# Patient Record
Sex: Female | Born: 1970
Health system: Southern US, Community
[De-identification: ages and names within clinical notes are randomized; demographics above are authoritative.]

## PROBLEM LIST (undated history)

## (undated) DIAGNOSIS — IMO0001 Reserved for inherently not codable concepts without codable children: Secondary | ICD-10-CM

## (undated) DIAGNOSIS — Z5189 Encounter for other specified aftercare: Secondary | ICD-10-CM

## (undated) DIAGNOSIS — Z1501 Genetic susceptibility to malignant neoplasm of breast: Principal | ICD-10-CM

## (undated) DIAGNOSIS — Z1509 Genetic susceptibility to other malignant neoplasm: Principal | ICD-10-CM

## (undated) DIAGNOSIS — T82868A Thrombosis of vascular prosthetic devices, implants and grafts, initial encounter: Secondary | ICD-10-CM

## (undated) DIAGNOSIS — R112 Nausea with vomiting, unspecified: Secondary | ICD-10-CM

## (undated) DIAGNOSIS — J40 Bronchitis, not specified as acute or chronic: Secondary | ICD-10-CM

## (undated) DIAGNOSIS — K589 Irritable bowel syndrome without diarrhea: Secondary | ICD-10-CM

## (undated) DIAGNOSIS — R011 Cardiac murmur, unspecified: Secondary | ICD-10-CM

## (undated) DIAGNOSIS — R51 Headache: Secondary | ICD-10-CM

## (undated) DIAGNOSIS — Z9889 Other specified postprocedural states: Secondary | ICD-10-CM

## (undated) DIAGNOSIS — M1990 Unspecified osteoarthritis, unspecified site: Secondary | ICD-10-CM

## (undated) DIAGNOSIS — M8580 Other specified disorders of bone density and structure, unspecified site: Secondary | ICD-10-CM

## (undated) DIAGNOSIS — R39198 Other difficulties with micturition: Secondary | ICD-10-CM

## (undated) DIAGNOSIS — K824 Cholesterolosis of gallbladder: Secondary | ICD-10-CM

## (undated) DIAGNOSIS — K219 Gastro-esophageal reflux disease without esophagitis: Secondary | ICD-10-CM

## (undated) DIAGNOSIS — F32A Depression, unspecified: Secondary | ICD-10-CM

## (undated) DIAGNOSIS — F419 Anxiety disorder, unspecified: Secondary | ICD-10-CM

## (undated) DIAGNOSIS — D759 Disease of blood and blood-forming organs, unspecified: Secondary | ICD-10-CM

## (undated) DIAGNOSIS — T7840XA Allergy, unspecified, initial encounter: Secondary | ICD-10-CM

## (undated) DIAGNOSIS — J302 Other seasonal allergic rhinitis: Secondary | ICD-10-CM

## (undated) DIAGNOSIS — F329 Major depressive disorder, single episode, unspecified: Secondary | ICD-10-CM

## (undated) DIAGNOSIS — D689 Coagulation defect, unspecified: Secondary | ICD-10-CM

## (undated) HISTORY — DX: Major depressive disorder, single episode, unspecified: F32.9

## (undated) HISTORY — DX: Coagulation defect, unspecified: D68.9

## (undated) HISTORY — PX: ABDOMINAL HYSTERECTOMY: SHX81

## (undated) HISTORY — DX: Allergy, unspecified, initial encounter: T78.40XA

## (undated) HISTORY — DX: Genetic susceptibility to malignant neoplasm of breast: Z15.01

## (undated) HISTORY — DX: Other specified disorders of bone density and structure, unspecified site: M85.80

## (undated) HISTORY — DX: Genetic susceptibility to other malignant neoplasm: Z15.09

## (undated) HISTORY — DX: Thrombosis due to vascular prosthetic devices, implants and grafts, initial encounter: T82.868A

## (undated) HISTORY — DX: Depression, unspecified: F32.A

## (undated) HISTORY — DX: Encounter for other specified aftercare: Z51.89

## (undated) HISTORY — DX: Cholesterolosis of gallbladder: K82.4

---

## 2000-03-14 ENCOUNTER — Other Ambulatory Visit: Admission: RE | Admit: 2000-03-14 | Discharge: 2000-03-14 | Payer: Self-pay | Admitting: Family Medicine

## 2001-06-05 ENCOUNTER — Inpatient Hospital Stay (HOSPITAL_COMMUNITY): Admission: AD | Admit: 2001-06-05 | Discharge: 2001-06-07 | Payer: Self-pay | Admitting: Obstetrics and Gynecology

## 2001-07-17 ENCOUNTER — Other Ambulatory Visit: Admission: RE | Admit: 2001-07-17 | Discharge: 2001-07-17 | Payer: Self-pay | Admitting: Obstetrics and Gynecology

## 2002-09-03 ENCOUNTER — Other Ambulatory Visit: Admission: RE | Admit: 2002-09-03 | Discharge: 2002-09-03 | Payer: Self-pay | Admitting: Obstetrics and Gynecology

## 2003-09-25 ENCOUNTER — Other Ambulatory Visit: Admission: RE | Admit: 2003-09-25 | Discharge: 2003-09-25 | Payer: Self-pay | Admitting: Obstetrics and Gynecology

## 2004-10-14 ENCOUNTER — Other Ambulatory Visit: Admission: RE | Admit: 2004-10-14 | Discharge: 2004-10-14 | Payer: Self-pay | Admitting: Obstetrics and Gynecology

## 2005-01-16 HISTORY — PX: NASAL SEPTUM SURGERY: SHX37

## 2006-02-22 ENCOUNTER — Encounter: Admission: RE | Admit: 2006-02-22 | Discharge: 2006-02-22 | Payer: Self-pay | Admitting: Internal Medicine

## 2009-10-31 ENCOUNTER — Emergency Department (HOSPITAL_COMMUNITY): Admission: EM | Admit: 2009-10-31 | Discharge: 2009-10-31 | Payer: Self-pay | Admitting: Family Medicine

## 2009-11-18 ENCOUNTER — Encounter: Admission: RE | Admit: 2009-11-18 | Discharge: 2009-11-18 | Payer: Self-pay | Admitting: Internal Medicine

## 2010-03-30 LAB — AST: AST: 20 U/L (ref 0–37)

## 2010-03-30 LAB — ALT: ALT: 16 U/L (ref 0–35)

## 2010-03-30 LAB — GAMMA GT: GGT: 31 U/L (ref 7–51)

## 2010-06-20 ENCOUNTER — Encounter: Payer: Self-pay | Admitting: Genetic Counselor

## 2010-08-15 ENCOUNTER — Encounter: Payer: BC Managed Care – PPO | Admitting: Genetic Counselor

## 2010-09-09 ENCOUNTER — Encounter: Payer: BC Managed Care – PPO | Admitting: Oncology

## 2010-09-27 ENCOUNTER — Encounter (HOSPITAL_BASED_OUTPATIENT_CLINIC_OR_DEPARTMENT_OTHER): Payer: BC Managed Care – PPO | Admitting: Oncology

## 2010-09-27 ENCOUNTER — Other Ambulatory Visit: Payer: Self-pay | Admitting: Oncology

## 2010-09-27 DIAGNOSIS — Z7189 Other specified counseling: Secondary | ICD-10-CM

## 2010-09-27 DIAGNOSIS — Z8041 Family history of malignant neoplasm of ovary: Secondary | ICD-10-CM

## 2010-09-27 DIAGNOSIS — Z803 Family history of malignant neoplasm of breast: Secondary | ICD-10-CM

## 2010-09-30 ENCOUNTER — Other Ambulatory Visit: Payer: Self-pay | Admitting: Oncology

## 2010-09-30 ENCOUNTER — Encounter (HOSPITAL_BASED_OUTPATIENT_CLINIC_OR_DEPARTMENT_OTHER): Payer: BC Managed Care – PPO | Admitting: Oncology

## 2010-09-30 DIAGNOSIS — Z803 Family history of malignant neoplasm of breast: Secondary | ICD-10-CM

## 2010-09-30 LAB — CBC WITH DIFFERENTIAL/PLATELET
EOS%: 1.8 % (ref 0.0–7.0)
Eosinophils Absolute: 0.1 10*3/uL (ref 0.0–0.5)
LYMPH%: 40.1 % (ref 14.0–49.7)
MCH: 30.2 pg (ref 25.1–34.0)
MCHC: 35.3 g/dL (ref 31.5–36.0)
MCV: 85.6 fL (ref 79.5–101.0)
MONO%: 5.7 % (ref 0.0–14.0)
NEUT#: 2.4 10*3/uL (ref 1.5–6.5)
Platelets: 183 10*3/uL (ref 145–400)
RBC: 4.24 10*6/uL (ref 3.70–5.45)
RDW: 11.7 % (ref 11.2–14.5)

## 2010-09-30 LAB — COMPREHENSIVE METABOLIC PANEL
AST: 20 U/L (ref 0–37)
Alkaline Phosphatase: 51 U/L (ref 39–117)
Glucose, Bld: 87 mg/dL (ref 70–99)
Potassium: 4.3 mEq/L (ref 3.5–5.3)
Sodium: 142 mEq/L (ref 135–145)
Total Bilirubin: 2.6 mg/dL — ABNORMAL HIGH (ref 0.3–1.2)
Total Protein: 6.7 g/dL (ref 6.0–8.3)

## 2010-10-05 ENCOUNTER — Other Ambulatory Visit: Payer: Self-pay | Admitting: Obstetrics and Gynecology

## 2010-10-13 ENCOUNTER — Other Ambulatory Visit: Payer: Self-pay | Admitting: Oncology

## 2010-10-14 ENCOUNTER — Other Ambulatory Visit: Payer: Self-pay | Admitting: Oncology

## 2010-10-14 DIAGNOSIS — Z1501 Genetic susceptibility to malignant neoplasm of breast: Secondary | ICD-10-CM

## 2010-10-18 ENCOUNTER — Encounter: Payer: Self-pay | Admitting: *Deleted

## 2010-10-18 DIAGNOSIS — Z8481 Family history of carrier of genetic disease: Secondary | ICD-10-CM

## 2010-11-03 ENCOUNTER — Ambulatory Visit
Admission: RE | Admit: 2010-11-03 | Discharge: 2010-11-03 | Disposition: A | Discharge status: Home / Self Care | Payer: BC Managed Care – PPO | Source: Ambulatory Visit | Attending: Oncology | Admitting: Oncology

## 2010-11-03 DIAGNOSIS — Z1501 Genetic susceptibility to malignant neoplasm of breast: Secondary | ICD-10-CM

## 2010-11-09 ENCOUNTER — Other Ambulatory Visit: Payer: BC Managed Care – PPO

## 2010-11-11 NOTE — Patient Instructions (Addendum)
   Your procedure is scheduled WJ:[id number] November 6th  Enter through the Main Entrance of [facility] at:8:30am Pick up the phone at the desk and dial [phone] and inform us of your arrival.  Please call this number if you have any problems the morning of surgery: [phone]  Remember: Do not eat food after midnight:Monday Do not drink clear liquids after:midnight Monday Take these medicines the morning of surgery with a SIP OF WATER:none Do not wear jewelry, make-up, or FINGER nail polish Do not wear lotions, powders, or perfumes.  You may not wear deodorant. Do not shave 48 hours prior to surgery. Do not bring valuables to the hospital.  Leave suitcase in the car. After Surgery it may be brought to your room. For patients being admitted to the hospital, checkout time is 11:00am the day of discharge.    Remember to use your hibiclens as instructed.Please shower with 1/2 bottle the evening before your surgery and the other 1/2 bottle the morning of surgery.

## 2010-11-15 ENCOUNTER — Encounter (HOSPITAL_COMMUNITY): Payer: Self-pay

## 2010-11-15 ENCOUNTER — Encounter (HOSPITAL_COMMUNITY)
Admission: RE | Admit: 2010-11-15 | Discharge: 2010-11-15 | Disposition: A | Discharge status: Home / Self Care | Payer: BC Managed Care – PPO | Source: Ambulatory Visit | Attending: Obstetrics and Gynecology | Admitting: Obstetrics and Gynecology

## 2010-11-15 HISTORY — DX: Cardiac murmur, unspecified: R01.1

## 2010-11-15 HISTORY — DX: Encounter for other specified aftercare: Z51.89

## 2010-11-15 HISTORY — DX: Other specified postprocedural states: Z98.890

## 2010-11-15 HISTORY — DX: Gastro-esophageal reflux disease without esophagitis: K21.9

## 2010-11-15 HISTORY — DX: Headache: R51

## 2010-11-15 HISTORY — DX: Reserved for inherently not codable concepts without codable children: IMO0001

## 2010-11-15 HISTORY — DX: Other seasonal allergic rhinitis: J30.2

## 2010-11-15 HISTORY — DX: Other specified postprocedural states: R11.2

## 2010-11-15 LAB — CBC
Hemoglobin: 12.5 g/dL (ref 12.0–15.0)
MCH: 29.8 pg (ref 26.0–34.0)
MCHC: 33.9 g/dL (ref 30.0–36.0)
MCV: 88.1 fL (ref 78.0–100.0)
RBC: 4.19 MIL/uL (ref 3.87–5.11)

## 2010-11-21 MED ORDER — DEXTROSE 5 % IV SOLN
1.0000 g | INTRAVENOUS | Status: DC
  Filled 2010-11-21: qty 1

## 2010-11-21 NOTE — H&P (Signed)
NAME:  Dawn Boyd, DIRENZO NO.:  [id number]  MEDICAL RECORD NO.:  [id number]  LOCATION:  [facility]                           FACILITY:  [facility]  PHYSICIAN:  Randye Lobo, M.D.   DATE OF BIRTH:  1971/01/11  DATE OF ADMISSION:  11/15/2010 DATE OF DISCHARGE:  11/15/2010                             HISTORY & PHYSICAL   Preoperative history and physical examination is performed November 02, 2010.  Surgery is scheduled for the [facility] for November 22, 2010.  CHIEF COMPLAINT:  Positive BRCA2 mutation carrier.  HISTORY OF PRESENT ILLNESS:  The patient is a 40 year old gravida 2, para 2 Caucasian female who is known to be a carrier of the BRCA2 mutation.  The patient presented for routine gynecologic care in May of this year at which time her family history of her mother with the diagnosis of ovarian cancer diagnosed at age 71 was discussed with her. Recommendation was made to proceed with genetic counseling and testing. The patient was subsequently tested positive for BRCA2 mutation ([id number]).  The patient underwent extensive counseling with doctor Dr. Drue Second of the [facility].  After counseling with Dr. Welton Flakes and myself, the patient is now requesting to proceed with hysterectomy with removal of tubes and ovaries.  As part of the patient's preoperative evaluation, she underwent a pelvic ultrasound on September 21, 2010 which documented a normal uterus with an endometrial stripe of 8.14 mm.  The patient was noted to have a simple right ovarian cyst measuring 1.0 cm and a simple left ovarian cyst measuring 1.7 cm.  No abnormal blood flow was appreciated.  A CA-125 measured 7 on September 21, 2010.  The patient subsequently underwent a followup ultrasound on November 02, 2010 and this again documented a normal uterus with a left ovarian simple cyst now measuring 2.8 cm.  Past obstetric and gynecologic history is significant for 2 prior vaginal  deliveries.  The patient uses vasectomy for her form of birth control.  Her last Pap smear was performed Jun 02, 2010 and was within normal limits.  Her last mammogram was performed Jun 03, 2010 and was within normal limits.  The patient is scheduled for MRI of the breasts as part of her high risk screening protocol.  PAST MEDICAL HISTORY:  Migraine headaches.  The patient takes Maxalt as needed and she takes Tylenol almost daily.  Lactose intolerance.  MEDICATIONS:  Tylenol daily, Maxalt p.r.n., occasional multivitamin.  ALLERGIES:  No known drug allergies.  The patient is LACTOSE intolerant.  FAMILY HISTORY:  Positive for ovarian cancer in the patient's mother. The patient's mother is alive and well.  The patient has 2 paternal great cousins with breast cancer.  REVIEW OF SYSTEMS:  The patient recently had a viral illness with diarrhea which has resolved.  PHYSICAL EXAMINATION:  VITAL SIGNS:  Height is 5 feet, 3 inches, weight 114 pounds, blood pressure is 90/60. HEENT:  Normocephalic, atraumatic. NECK:  Negative for adenopathy and thyromegaly. LUNGS:  Clear to auscultation bilaterally. HEART:  S1, S2 with a regular rate and rhythm. ABDOMEN:  Soft and nontender and without evidence of hepatosplenomegaly or organomegaly. BREASTS:  No dominant  masses, skin retractions, nipple discharge, or axillary adenopathy. PELVIC:  Normal external genitalia and urethra.  The cervix and vagina demonstrate no lesions.  The uterus is small and nontender.  Exam of the adnexal regions demonstrated fullness in the right adnexa and a normal left adnexa.  These areas were nontender.  IMPRESSION:  The patient is a 40 year old para 2 female who is a carrier of the BRCA2 gene mutation ([id number]).  The patient has a simple left ovarian cyst on ultrasound with no abnormal blood flow and she has a normal CA-125.  PLAN:  The patient will undergo a laparoscopically assisted vaginal hysterectomy with  bilateral salpingo-oophorectomy and collection of pelvic washings at the [facility] [place] on November 22, 2010.  Risks, benefits, alternatives, and surgical goals have been reviewed with the patient who wishes to proceed.     Randye Lobo, M.D.     BES/MEDQ  D:  11/21/2010  T:  11/21/2010  Job:  [id number]

## 2010-11-22 ENCOUNTER — Ambulatory Visit (HOSPITAL_COMMUNITY)
Admission: RE | Admit: 2010-11-22 | Discharge: 2010-11-24 | Disposition: A | Discharge status: Home / Self Care | Payer: BC Managed Care – PPO | Source: Ambulatory Visit | Attending: Obstetrics and Gynecology | Admitting: Obstetrics and Gynecology

## 2010-11-22 ENCOUNTER — Encounter (HOSPITAL_COMMUNITY): Payer: Self-pay | Admitting: *Deleted

## 2010-11-22 ENCOUNTER — Encounter (HOSPITAL_COMMUNITY): Payer: Self-pay | Admitting: Anesthesiology

## 2010-11-22 ENCOUNTER — Other Ambulatory Visit: Payer: Self-pay | Admitting: Obstetrics and Gynecology

## 2010-11-22 ENCOUNTER — Ambulatory Visit (HOSPITAL_COMMUNITY): Payer: BC Managed Care – PPO | Admitting: Anesthesiology

## 2010-11-22 ENCOUNTER — Encounter (HOSPITAL_COMMUNITY)
Admission: RE | Disposition: A | Discharge status: Home / Self Care | Payer: Self-pay | Source: Ambulatory Visit | Attending: Obstetrics and Gynecology

## 2010-11-22 DIAGNOSIS — Z8041 Family history of malignant neoplasm of ovary: Secondary | ICD-10-CM | POA: Insufficient documentation

## 2010-11-22 DIAGNOSIS — Z4002 Encounter for prophylactic removal of ovary: Secondary | ICD-10-CM | POA: Insufficient documentation

## 2010-11-22 DIAGNOSIS — Z01818 Encounter for other preprocedural examination: Secondary | ICD-10-CM | POA: Insufficient documentation

## 2010-11-22 DIAGNOSIS — Z1501 Genetic susceptibility to malignant neoplasm of breast: Secondary | ICD-10-CM | POA: Insufficient documentation

## 2010-11-22 DIAGNOSIS — Z01812 Encounter for preprocedural laboratory examination: Secondary | ICD-10-CM | POA: Insufficient documentation

## 2010-11-22 HISTORY — PX: SALPINGOOPHORECTOMY: SHX82

## 2010-11-22 HISTORY — PX: LAPAROSCOPIC ASSISTED VAGINAL HYSTERECTOMY: SHX5398

## 2010-11-22 LAB — COMPREHENSIVE METABOLIC PANEL
ALT: 15 U/L (ref 0–35)
AST: 22 U/L (ref 0–37)
Albumin: 4.5 g/dL (ref 3.5–5.2)
Calcium: 9.8 mg/dL (ref 8.4–10.5)
GFR calc Af Amer: >90 mL/min (ref >90)
Sodium: 134 mEq/L — ABNORMAL LOW (ref 135–145)
Total Protein: 7 g/dL (ref 6.0–8.3)

## 2010-11-22 LAB — URINALYSIS, ROUTINE W REFLEX MICROSCOPIC
Ketones, ur: >80 mg/dL — AB
Leukocytes, UA: NEGATIVE
Nitrite: NEGATIVE
Specific Gravity, Urine: >1.03 — ABNORMAL HIGH (ref 1.005–1.030)
pH: 5 (ref 5.0–8.0)

## 2010-11-22 LAB — APTT: aPTT: 28 seconds (ref 24–37)

## 2010-11-22 LAB — PROTIME-INR: INR: 1.18 (ref 0.00–1.49)

## 2010-11-22 LAB — URINE MICROSCOPIC-ADD ON

## 2010-11-22 SURGERY — HYSTERECTOMY, VAGINAL, LAPAROSCOPY-ASSISTED
Anesthesia: General | Site: Abdomen | Wound class: Clean Contaminated

## 2010-11-22 MED ORDER — DOCUSATE SODIUM 100 MG PO CAPS
100.0000 mg | ORAL_CAPSULE | Freq: Every day | ORAL | Status: DC
  Administered 2010-11-22 – 2010-11-24 (×3): 100 mg via ORAL
  Filled 2010-11-22 (×3): qty 1

## 2010-11-22 MED ORDER — LIDOCAINE HCL (CARDIAC) 20 MG/ML IV SOLN
INTRAVENOUS | Status: DC | PRN
  Administered 2010-11-22: 50 mg via INTRAVENOUS

## 2010-11-22 MED ORDER — ONDANSETRON HCL 4 MG/2ML IJ SOLN
INTRAMUSCULAR | Status: AC
  Filled 2010-11-22: qty 4

## 2010-11-22 MED ORDER — BUPIVACAINE HCL (PF) 0.25 % IJ SOLN
INTRAMUSCULAR | Status: DC | PRN
  Administered 2010-11-22: 10 mL

## 2010-11-22 MED ORDER — MIDAZOLAM HCL 2 MG/2ML IJ SOLN
INTRAMUSCULAR | Status: AC
  Filled 2010-11-22: qty 2

## 2010-11-22 MED ORDER — HYDROMORPHONE HCL PF 1 MG/ML IJ SOLN
INTRAMUSCULAR | Status: DC | PRN
  Administered 2010-11-22: 1 mg via INTRAVENOUS

## 2010-11-22 MED ORDER — ROCURONIUM BROMIDE 50 MG/5ML IV SOLN
INTRAVENOUS | Status: AC
  Filled 2010-11-22: qty 1

## 2010-11-22 MED ORDER — LIDOCAINE-EPINEPHRINE 1 %-1:100000 IJ SOLN
INTRAMUSCULAR | Status: DC | PRN
  Administered 2010-11-22: 10 mL

## 2010-11-22 MED ORDER — DEXAMETHASONE SODIUM PHOSPHATE 10 MG/ML IJ SOLN
INTRAMUSCULAR | Status: AC
  Filled 2010-11-22: qty 1

## 2010-11-22 MED ORDER — DIPHENHYDRAMINE HCL 12.5 MG/5ML PO ELIX: 12.5000 mg | ORAL_SOLUTION | Freq: Four times a day (QID) | ORAL | Status: DC | PRN

## 2010-11-22 MED ORDER — OXYCODONE-ACETAMINOPHEN 5-325 MG PO TABS
1.0000 | ORAL_TABLET | ORAL | Status: DC | PRN
  Administered 2010-11-23 (×3): 1 via ORAL
  Administered 2010-11-23: 2 via ORAL
  Administered 2010-11-23 – 2010-11-24 (×3): 1 via ORAL
  Administered 2010-11-24: 2 via ORAL
  Filled 2010-11-22 (×2): qty 1
  Filled 2010-11-22: qty 2
  Filled 2010-11-22 (×3): qty 1
  Filled 2010-11-22: qty 2
  Filled 2010-11-22: qty 1

## 2010-11-22 MED ORDER — DEXTROSE 5 % IV SOLN
1.0000 g | INTRAVENOUS | Status: DC | PRN
  Administered 2010-11-22: 1 g via INTRAVENOUS

## 2010-11-22 MED ORDER — ONDANSETRON HCL 4 MG PO TABS: 4.0000 mg | ORAL_TABLET | Freq: Four times a day (QID) | ORAL | Status: DC | PRN

## 2010-11-22 MED ORDER — TEMAZEPAM 15 MG PO CAPS: 15.0000 mg | ORAL_CAPSULE | Freq: Every evening | ORAL | Status: DC | PRN

## 2010-11-22 MED ORDER — GLYCOPYRROLATE 0.2 MG/ML IJ SOLN
INTRAMUSCULAR | Status: DC | PRN
  Administered 2010-11-22: .6 mg via INTRAVENOUS

## 2010-11-22 MED ORDER — PROPOFOL 10 MG/ML IV EMUL
INTRAVENOUS | Status: DC | PRN
  Administered 2010-11-22: 130 mg via INTRAVENOUS

## 2010-11-22 MED ORDER — DEXAMETHASONE SODIUM PHOSPHATE 4 MG/ML IJ SOLN
INTRAMUSCULAR | Status: DC | PRN
  Administered 2010-11-22: 10 mg via INTRAVENOUS

## 2010-11-22 MED ORDER — METOCLOPRAMIDE HCL 5 MG/ML IJ SOLN
INTRAMUSCULAR | Status: AC
  Filled 2010-11-22: qty 2

## 2010-11-22 MED ORDER — MORPHINE SULFATE (PF) 1 MG/ML IV SOLN
INTRAVENOUS | Status: DC
  Administered 2010-11-22: 13.5 mg via INTRAVENOUS
  Administered 2010-11-22: 9 mL via INTRAVENOUS
  Administered 2010-11-22: 16:00:00 via INTRAVENOUS
  Administered 2010-11-23 (×2): 6 mg via INTRAVENOUS
  Filled 2010-11-22: qty 30

## 2010-11-22 MED ORDER — DIPHENHYDRAMINE HCL 50 MG/ML IJ SOLN: 12.5000 mg | Freq: Four times a day (QID) | INTRAMUSCULAR | Status: DC | PRN

## 2010-11-22 MED ORDER — GLYCOPYRROLATE 0.2 MG/ML IJ SOLN
INTRAMUSCULAR | Status: AC
  Filled 2010-11-22: qty 1

## 2010-11-22 MED ORDER — FENTANYL CITRATE 0.05 MG/ML IJ SOLN
INTRAMUSCULAR | Status: AC
  Filled 2010-11-22: qty 5

## 2010-11-22 MED ORDER — PROPOFOL 10 MG/ML IV EMUL
INTRAVENOUS | Status: AC
  Filled 2010-11-22: qty 20

## 2010-11-22 MED ORDER — SIMETHICONE 80 MG PO CHEW: 80.0000 mg | CHEWABLE_TABLET | Freq: Four times a day (QID) | ORAL | Status: DC | PRN

## 2010-11-22 MED ORDER — SCOPOLAMINE 1 MG/3DAYS TD PT72
MEDICATED_PATCH | TRANSDERMAL | Status: AC
  Administered 2010-11-22: 1.5 mg via TRANSDERMAL
  Filled 2010-11-22: qty 1

## 2010-11-22 MED ORDER — ONDANSETRON HCL 4 MG/2ML IJ SOLN: 4.0000 mg | Freq: Four times a day (QID) | INTRAMUSCULAR | Status: DC | PRN

## 2010-11-22 MED ORDER — ONDANSETRON HCL 4 MG/2ML IJ SOLN
4.0000 mg | Freq: Once | INTRAMUSCULAR | Status: AC
  Administered 2010-11-22: 4 mg via INTRAVENOUS

## 2010-11-22 MED ORDER — NEOSTIGMINE METHYLSULFATE 1 MG/ML IJ SOLN
INTRAMUSCULAR | Status: DC | PRN
  Administered 2010-11-22: 3 mg via INTRAVENOUS

## 2010-11-22 MED ORDER — SODIUM CHLORIDE 0.9 % IJ SOLN: 9.0000 mL | INTRAMUSCULAR | Status: DC | PRN

## 2010-11-22 MED ORDER — ROCURONIUM BROMIDE 100 MG/10ML IV SOLN
INTRAVENOUS | Status: DC | PRN
  Administered 2010-11-22: 5 mg via INTRAVENOUS
  Administered 2010-11-22: 40 mg via INTRAVENOUS
  Administered 2010-11-22: 10 mg via INTRAVENOUS

## 2010-11-22 MED ORDER — HYDROMORPHONE HCL PF 1 MG/ML IJ SOLN
INTRAMUSCULAR | Status: AC
  Filled 2010-11-22: qty 1

## 2010-11-22 MED ORDER — LACTATED RINGERS IV SOLN
INTRAVENOUS | Status: DC
  Administered 2010-11-22 – 2010-11-23 (×2): via INTRAVENOUS

## 2010-11-22 MED ORDER — NALOXONE HCL 0.4 MG/ML IJ SOLN: 0.4000 mg | INTRAMUSCULAR | Status: DC | PRN

## 2010-11-22 MED ORDER — SCOPOLAMINE 1 MG/3DAYS TD PT72
1.0000 | MEDICATED_PATCH | Freq: Once | TRANSDERMAL | Status: DC
  Administered 2010-11-22: 1.5 mg via TRANSDERMAL

## 2010-11-22 MED ORDER — ONDANSETRON HCL 4 MG/2ML IJ SOLN
INTRAMUSCULAR | Status: AC
  Administered 2010-11-22: 4 mg via INTRAVENOUS
  Filled 2010-11-22: qty 2

## 2010-11-22 MED ORDER — KETOROLAC TROMETHAMINE 30 MG/ML IJ SOLN
30.0000 mg | Freq: Four times a day (QID) | INTRAMUSCULAR | Status: AC
  Administered 2010-11-22 – 2010-11-23 (×3): 30 mg via INTRAVENOUS
  Filled 2010-11-22 (×3): qty 1

## 2010-11-22 MED ORDER — KETOROLAC TROMETHAMINE 30 MG/ML IJ SOLN
INTRAMUSCULAR | Status: DC | PRN
  Administered 2010-11-22: 30 mg via INTRAVENOUS

## 2010-11-22 MED ORDER — FENTANYL CITRATE 0.05 MG/ML IJ SOLN: 25.0000 ug | INTRAMUSCULAR | Status: DC | PRN

## 2010-11-22 MED ORDER — LACTATED RINGERS IR SOLN
Status: DC | PRN
  Administered 2010-11-22: 3000 mL

## 2010-11-22 MED ORDER — LIDOCAINE HCL (CARDIAC) 20 MG/ML IV SOLN
INTRAVENOUS | Status: AC
  Filled 2010-11-22: qty 5

## 2010-11-22 MED ORDER — MIDAZOLAM HCL 5 MG/5ML IJ SOLN
INTRAMUSCULAR | Status: DC | PRN
  Administered 2010-11-22: 2 mg via INTRAVENOUS

## 2010-11-22 MED ORDER — KETOROLAC TROMETHAMINE 30 MG/ML IJ SOLN
INTRAMUSCULAR | Status: AC
  Filled 2010-11-22: qty 1

## 2010-11-22 MED ORDER — NEOSTIGMINE METHYLSULFATE 1 MG/ML IJ SOLN
INTRAMUSCULAR | Status: AC
  Filled 2010-11-22: qty 10

## 2010-11-22 MED ORDER — MEPERIDINE HCL 25 MG/ML IJ SOLN: 6.2500 mg | INTRAMUSCULAR | Status: DC | PRN

## 2010-11-22 MED ORDER — MENTHOL 3 MG MT LOZG: 1.0000 | LOZENGE | OROMUCOSAL | Status: DC | PRN

## 2010-11-22 MED ORDER — METOCLOPRAMIDE HCL 5 MG/ML IJ SOLN
10.0000 mg | Freq: Once | INTRAMUSCULAR | Status: AC | PRN
  Administered 2010-11-22: 10 mg via INTRAVENOUS

## 2010-11-22 MED ORDER — IBUPROFEN 600 MG PO TABS
600.0000 mg | ORAL_TABLET | Freq: Four times a day (QID) | ORAL | Status: DC | PRN
  Administered 2010-11-23 (×2): 600 mg via ORAL
  Filled 2010-11-22 (×2): qty 1

## 2010-11-22 MED ORDER — LACTATED RINGERS IV SOLN
INTRAVENOUS | Status: DC
  Administered 2010-11-22 (×3): via INTRAVENOUS

## 2010-11-22 MED ORDER — FENTANYL CITRATE 0.05 MG/ML IJ SOLN
INTRAMUSCULAR | Status: DC | PRN
  Administered 2010-11-22: 100 ug via INTRAVENOUS
  Administered 2010-11-22: 150 ug via INTRAVENOUS

## 2010-11-22 SURGICAL SUPPLY — 36 items
ADH SKN CLS APL DERMABOND .7 (GAUZE/BANDAGES/DRESSINGS) ×2
CATH ROBINSON RED A/P 16FR (CATHETERS) IMPLANT
CLOTH BEACON ORANGE TIMEOUT ST (SAFETY) ×3 IMPLANT
CONT PATH 16OZ SNAP LID 3702 (MISCELLANEOUS) ×3 IMPLANT
COVER TABLE BACK 60X90 (DRAPES) ×3 IMPLANT
DECANTER SPIKE VIAL GLASS SM (MISCELLANEOUS) ×1 IMPLANT
DERMABOND ADVANCED (GAUZE/BANDAGES/DRESSINGS) ×1
DERMABOND ADVANCED .7 DNX12 (GAUZE/BANDAGES/DRESSINGS) IMPLANT
DRAPE UTILITY XL STRL (DRAPES) ×3 IMPLANT
ELECT REM PT RETURN 9FT ADLT (ELECTROSURGICAL) ×3
ELECTRODE REM PT RTRN 9FT ADLT (ELECTROSURGICAL) IMPLANT
FILTER SMOKE EVAC LAPAROSHD (FILTER) ×3 IMPLANT
FORCEPS CUTTING 33CM 5MM (CUTTING FORCEPS) ×1 IMPLANT
GLOVE BIO SURGEON STRL SZ 6.5 (GLOVE) ×12 IMPLANT
GOWN PREVENTION PLUS LG XLONG (DISPOSABLE) ×12 IMPLANT
NS IRRIG 1000ML POUR BTL (IV SOLUTION) ×3 IMPLANT
PACK LAVH (CUSTOM PROCEDURE TRAY) ×3 IMPLANT
SCISSORS LAP 5X35 DISP (ENDOMECHANICALS) ×1 IMPLANT
SET IRRIG TUBING LAPAROSCOPIC (IRRIGATION / IRRIGATOR) ×1 IMPLANT
SOLUTION ELECTROLUBE (MISCELLANEOUS) ×1 IMPLANT
STRIP CLOSURE SKIN 1/4X3 (GAUZE/BANDAGES/DRESSINGS) ×1 IMPLANT
SUT VIC AB 0 CT1 18XCR BRD8 (SUTURE) ×6 IMPLANT
SUT VIC AB 0 CT1 27 (SUTURE) ×6
SUT VIC AB 0 CT1 27XBRD ANBCTR (SUTURE) ×4 IMPLANT
SUT VIC AB 0 CT1 36 (SUTURE) ×3 IMPLANT
SUT VIC AB 0 CT1 8-18 (SUTURE) ×9
SUT VICRYL 0 TIES 12 18 (SUTURE) ×3 IMPLANT
SUT VICRYL 4-0 PS2 18IN ABS (SUTURE) ×3 IMPLANT
TIP UTERINE 5.1X6CM LAV DISP (MISCELLANEOUS) IMPLANT
TIP UTERINE 6.7X10CM GRN DISP (MISCELLANEOUS) IMPLANT
TIP UTERINE 6.7X6CM WHT DISP (MISCELLANEOUS) IMPLANT
TIP UTERINE 6.7X8CM BLUE DISP (MISCELLANEOUS) IMPLANT
TOWEL OR 17X24 6PK STRL BLUE (TOWEL DISPOSABLE) ×6 IMPLANT
TRAY FOLEY CATH 16FR SILVER (SET/KITS/TRAYS/PACK) ×3 IMPLANT
WARMER LAPAROSCOPE (MISCELLANEOUS) ×3 IMPLANT
WATER STERILE IRR 1000ML POUR (IV SOLUTION) ×3 IMPLANT

## 2010-11-22 NOTE — Anesthesia Postprocedure Evaluation (Signed)
  Anesthesia Post-op Note  Patient: Dawn Boyd  Procedure(s) Performed:  LAPAROSCOPIC ASSISTED VAGINAL HYSTERECTOMY; SALPINGO OOPHERECTOMY  Patient Location: PACU  Anesthesia Type: General  Level of Consciousness: awake, alert  and oriented  Airway and Oxygen Therapy: Patient Spontanous Breathing  Post-op Pain: mild  Post-op Assessment: Post-op Vital signs reviewed, Patient's Cardiovascular Status Stable, Respiratory Function Stable, Patent Airway, No signs of Nausea or vomiting and Pain level controlled  Post-op Vital Signs: Reviewed and stable  Complications: No apparent anesthesia complications

## 2010-11-22 NOTE — Transfer of Care (Signed)
Immediate Anesthesia Transfer of Care Note  Patient: Dawn Boyd  Procedure(s) Performed:  LAPAROSCOPIC ASSISTED VAGINAL HYSTERECTOMY; SALPINGO OOPHERECTOMY  Patient Location: PACU  Anesthesia Type: General  Level of Consciousness: awake and oriented  Airway & Oxygen Therapy: Patient Spontanous Breathing and Patient connected to nasal cannula oxygen  Post-op Assessment: Report given to PACU RN and Post -op Vital signs reviewed and stable  Post vital signs: Reviewed and stable  Complications: No apparent anesthesia complications

## 2010-11-22 NOTE — Op Note (Signed)
NAME:  Dawn Boyd, VENCILL NO.:  [id number]  MEDICAL RECORD NO.:  [id number]  LOCATION:  [facility]                          FACILITY:  [facility]  PHYSICIAN:  Randye Lobo, M.D.   DATE OF BIRTH:  18-Dec-1970  DATE OF PROCEDURE:  11/22/2010 DATE OF DISCHARGE:                              OPERATIVE REPORT   PREOPERATIVE DIAGNOSIS:  BRCA2 mutation carrier.  POSTOPERATIVE DIAGNOSIS:  BRCA2 mutation carrier.  PROCEDURE:  Laparoscopically-assisted vaginal hysterectomy with bilateral salpingo-oophorectomy, collection of pelvic washings.  SURGEON:  Randye Lobo, MD  ASSISTANT:  Luvenia Redden, MD  ANESTHESIA:  General endotracheal, local with 1% lidocaine with epinephrine 1:100,000, Marcaine 0.25%.  IV FLUIDS:  1700 mL Ringer's lactate.  ESTIMATED BLOOD LOSS:  100 mL.  URINE OUTPUT:  500 mL.  COMPLICATIONS:  None.  INDICATIONS FOR PROCEDURE:  The patient is a 40 year old, gravida 2, para 2, Caucasian female, who presents with a family history of a mother who developed ovarian cancer in her 5s.  The patient was sent for genetic counseling and testing and was found to have the BRCA2 mutation. The patient underwent extensive counseling regarding options for care and has chosen to proceed with a hysterectomy with removal of tubes and ovaries.  The patient did have a preoperative pelvic ultrasound which documented a simple left ovarian cyst measuring 2.8 cm.  There was no abnormal blood flow to the ovarian cyst and her CA-125 was 7.  A plan is now made to proceed with a laparoscopically-assisted vaginal hysterectomy with bilateral salpingo-oophorectomy after risks, benefits, alternatives, and surgical goals have been discussed.  Laparoscopy demonstrated a 1.5 cm simple-appearing left ovarian cyst, which did rupture during the procedure.  Clear fluid was noted.  The ovaries, fallopian tubes, and uterus were unremarkable.  There was no evidence of any excrescences or  papillations throughout the abdomen or pelvis.  There was no evidence of any adhesive disease.  In the upper abdomen, the liver and gallbladder appeared to be normal. The tip of the appendix appeared to be unremarkable.  There was no ascites in the abdomen.  There was a small amount of brownish stained peritoneal fluid in the cul-de-sac in the beginning of the procedure.  SPECIMENS:  The uterus with cervix, bilateral tubes, and ovaries were sent to pathology separately from pelvic washings.  PROCEDURE IN DETAIL:  The patient was reidentified in the preoperative hold area.  She received cefotetan 1 g IV for antibiotic prophylaxis. She received both TED hose and PAS stockings for DVT prophylaxis.  In the operating room, general endotracheal anesthesia was induced and the patient was then placed in the dorsal lithotomy position.  The lower abdomen, vagina, and perineum were sterilely prepped and draped.  A Foley catheter was placed inside the bladder.  A speculum was placed inside the vagina and a single-tooth tenaculum was placed on the anterior cervical lip.  This was replaced with a Hulka tenaculum and the remaining vaginal instruments were removed.  Attention was turned to the abdomen where a 1-cm vertical umbilical incision was created sharply with a scalpel.  Dissection down to the fascia was performed with an Allis clamp.  An attempt was  made to place the 10-mm trocar directly inside the peritoneal cavity, but this was not possible initially.  The Veress needle was then placed intraperitoneally late and when the pressure was checked, it was noted to be high and the Veress needle was therefore removed.  There was one final attempt at placing the trocar directly and this was successful.  The laparoscope confirmed proper placement.  A CO2 pneumoperitoneum was achieved.  The patient was placed in the Trendelenburg position.  A 5-mm right and left lower quadrant incisions were  created and 5-mm trocars were placed under direct visualization of the laparoscope.  Pelvic washings were performed and sent to pathology.  An inspection of the pelvic and abdominal organs was performed.  The procedure began by taking down some small congenital adhesions between the sigmoid colon and the left infundibulopelvic ligament.  This was performed with a combination of sharp and blunt dissection.  The region of the ureter was identified.  The infundibulopelvic ligament was then grasped with the gyrus instrument.  It was triply cauterized and then bisected using the same.  Dissection continued through the left broad ligament using the same instrument.  The left round ligament was then cauterized and bisected.  Dissection was continued through the anterior leaf of the broad ligament on the patient's left-hand side using the gyrus.  The bladder flap was partially taken down on the left- hand side.  The same procedure that was performed on the patient's left- hand side was then repeated on the right-hand side again using the gyrus instrument.  The bladder was further taken down in the midline.  Each of the uterine arteries were cauterized at this time and were bisected with the gyrus instrument.  Hemostasis was good at this time and the remainder of the hysterectomy was performed vaginally.  The CO2 pneumoperitoneum was released.  The patient was placed in the high lithotomy position.  A weighted speculum was placed inside the vagina.  Tenaculums were placed on the anterior and posterior cervical lips.  The cervix was injected circumferentially with lidocaine 1% with epinephrine 1:100,000.  The cervix was circumscribed with a scalpel.  The posterior cul-de-sac was entered sharply with the Mayo scissors.  A long weighted speculum was placed inside the vagina and into the cul-de-sac.  Each of the uterosacral ligaments were then clamped, sharply divided, and suture ligated with  0-Vicryl.  The bladder was dissected off the cervix in the midline and each of the bladder pillars were then clamped, sharply divided, and suture ligated with 0-Vicryl bilaterally.  The inferior aspects of the cardinal ligaments were then clamped, sharply divided, and suture ligated with 0-Vicryl bilaterally.  This allowed the specimen to be freed which was sent to pathology.  There was some bleeding near the patient's left uterosacral ligament at this time and a figure-of-eight suture of 0-Vicryl was placed after monopolar cautery was not successful to control hemostasis.  This controlled the bleeding well after the suture was placed.  The posterior vaginal cuff was whip stitched with a running lock suture of 0-Vicryl.  A McCall culdoplasty suture was performed at this time.  The suture was brought through the vagina and into the cul-de-sac at the 6 o'clock position.  It was brought down through the distal left uterosacral ligament, across the posterior cul-de-sac, down through the distal right uterosacral ligament and then into the cul-de-sac and out the vagina at the 6 o'clock position.  The vaginal cuff was then sutured with a running lock suture  of 0- Vicryl.  The McCall culdoplasty suture was tied and there was excellent elevation and support of the vaginal cuff.  Final laparoscopy was performed at this time.  The CO2 pneumoperitoneum was reachieved.  The pelvis was irrigated and suctioned.  There was 1 small vessel that was cauterized along the superior portion of the right infundibulopelvic ligament.  All operative sites were hemostatic at this time and the procedure was therefore concluded.  The 5-mm ports were removed under visualization of the laparoscope.  The umbilical trocar and the laparoscope were removed simultaneously after the CO2 pneumoperitoneum was released.  The umbilical incision was closed with a through-and-through suture of 0- Vicryl along the fascia.   All skin incisions were closed with subcuticular sutures of 3-0 Vicryl suture.  Dermabond was placed over the incisions.  The patient was awakened and extubated, and escorted to the recovery room in stable condition.  There were no complications to the procedure. All needle, instrument, and sponge counts were correct.     Randye Lobo, M.D.     BES/MEDQ  D:  11/22/2010  T:  11/22/2010  Job:  [id number]

## 2010-11-22 NOTE — Progress Notes (Signed)
Pt states "feeling nausea". Dr foster aware and order given.

## 2010-11-22 NOTE — Progress Notes (Signed)
Post Op Check in PACU  S - Patient comfortable. O - AVSS.  Abdomen - incisions are clean, dry, and intact.  Dermabond is present.  Vaginal pad - essentially dry.  Ext - PAS and Ted hose are on. A/P - 1. S/P LAVH/BSO, collection of pelvic washings.  Stable post op.  - Foley overnight.  - Morphine PCA.  - Toradol.  - CBC, BMP in am.  2. Hyperbilirubinemia - chronic.  - Check LFTs in am.

## 2010-11-22 NOTE — Progress Notes (Signed)
Pt states feels better.

## 2010-11-22 NOTE — Progress Notes (Addendum)
Pre-op Visit  No marked change in status since office pre-op visit.  Patient has been examined.    Patient has a chronically high bilirubin level.  She has been evaluated for this.  OK to proceed with surgery.

## 2010-11-22 NOTE — Anesthesia Preprocedure Evaluation (Addendum)
Anesthesia Evaluation  Patient identified by MRN, date of birth, ID band Patient awake    Reviewed: Allergy & Precautions, H&P , NPO status , Patient's Chart, lab work & pertinent test results  History of Anesthesia Complications (+) PONV and Family history of anesthesia reaction  Airway Mallampati: II TM Distance: >3 FB Neck ROM: Full    Dental No notable dental hx. (+) Teeth Intact   Pulmonary neg pulmonary ROS,  clear to auscultation  Pulmonary exam normal       Cardiovascular + Valvular Problems/Murmurs Regular Normal    Neuro/Psych  Headaches, Negative Psych ROS   GI/Hepatic negative GI ROS, Neg liver ROS, GERD-  Controlled,  Endo/Other  Negative Endocrine ROS  Renal/GU negative Renal ROS  Genitourinary negative   Musculoskeletal negative musculoskeletal ROS (+)   Abdominal   Peds  Hematology negative hematology ROS (+)   Anesthesia Other Findings   Reproductive/Obstetrics negative OB ROS                           Anesthesia Physical Anesthesia Plan  ASA: II  Anesthesia Plan: General   Post-op Pain Management:    Induction: Intravenous  Airway Management Planned: Oral ETT  Additional Equipment:   Intra-op Plan:   Post-operative Plan:   Informed Consent:   Dental advisory given  Plan Discussed with: CRNA, Anesthesiologist and Surgeon  Anesthesia Plan Comments:         Anesthesia Quick Evaluation

## 2010-11-22 NOTE — Brief Op Note (Signed)
11/22/2010  1:12 PM  PATIENT:  Dawn Boyd  40 y.o. female  PRE-OPERATIVE DIAGNOSIS:  BRCA2 mutation carrier  POST-OPERATIVE DIAGNOSIS:  BRCA2 mutation carrier  PROCEDURE:  Procedure(s): LAPAROSCOPIC ASSISTED VAGINAL HYSTERECTOMY SALPINGO OOPHERECTOMY, COLLECTION OF PELVIC WASHINGS  SURGEON:  Surgeon(s): Brook A Silva W Scott Bowie  PHYSICIAN ASSISTANT:   ASSISTANTS: Luvenia Redden   ANESTHESIA:   general, local  EBL:  Total I/O In: -  Out: 600 [Urine:500; Blood:100]  BLOOD ADMINISTERED:none  DRAINS: Urinary Catheter (Foley)   LOCAL MEDICATIONS USED:  LIDOCAINE 1% WITH EPI 100,000 CC, MARCAINE .25%  SPECIMEN:  Source of Specimen:   Uterus, cervix, tubes, and ovaries separately from pelvic washings.  DISPOSITION OF SPECIMEN:  PATHOLOGY  COUNTS:  YES  TOURNIQUET:  * No tourniquets in log *  DICTATION: .Other Dictation: Dictation Number    PLAN OF CARE: Admit for overnight observation  PATIENT DISPOSITION:  PACU - hemodynamically stable.   Delay start of Pharmacological VTE agent (>24hrs) due to surgical blood loss or risk of bleeding:  no

## 2010-11-23 ENCOUNTER — Encounter (HOSPITAL_COMMUNITY): Payer: Self-pay | Admitting: Obstetrics and Gynecology

## 2010-11-23 LAB — CBC
MCH: 30.9 pg (ref 26.0–34.0)
MCHC: 35.8 g/dL (ref 30.0–36.0)
MCV: 86.4 fL (ref 78.0–100.0)
Platelets: 138 10*3/uL — ABNORMAL LOW (ref 150–400)
RDW: 11.8 % (ref 11.5–15.5)

## 2010-11-23 LAB — BASIC METABOLIC PANEL
Calcium: 8.6 mg/dL (ref 8.4–10.5)
Creatinine, Ser: 0.71 mg/dL (ref 0.50–1.10)
GFR calc non Af Amer: >90 mL/min (ref >90)
Sodium: 137 mEq/L (ref 135–145)

## 2010-11-23 LAB — HEPATIC FUNCTION PANEL
Albumin: 2.9 g/dL — ABNORMAL LOW (ref 3.5–5.2)
Total Bilirubin: 1.9 mg/dL — ABNORMAL HIGH (ref 0.3–1.2)
Total Protein: 5 g/dL — ABNORMAL LOW (ref 6.0–8.3)

## 2010-11-23 MED ORDER — IBUPROFEN 600 MG PO TABS: 600.0000 mg | ORAL_TABLET | Freq: Four times a day (QID) | ORAL | Status: AC | PRN

## 2010-11-23 MED ORDER — OXYCODONE-ACETAMINOPHEN 5-325 MG PO TABS: 1.0000 | ORAL_TABLET | ORAL | Status: AC | PRN

## 2010-11-23 NOTE — Progress Notes (Signed)
POD #1  S - Foley out.  Ambulated once.  Good pain control with PCA. O - AVSS.  UO 2050 cc.  Lungs - CTA bilaterally.  Cor - S1 S2 RRR.  Abdomen - Active bowel sounds, soft, nontender.  Incisions clean, dry, intact.  Vag Pad - Essentially dry.  Labs - Hbg - 10.7, T bili - 1.9 A/P -  Doing well post op.  - Advance diet.  - D/C PCA and start po pain meds.  - Ambulate.  - D/C home after tolerates above.  - Rx:  Percocet, Motrin.  - Instructions/precautions given.    - Surgery reviewed with patient.  - Follow up in 4 weeks.

## 2010-11-24 NOTE — Progress Notes (Signed)
POD 2  S - Patient stayed last night in hospital.  Unable to void.  Had foley replaced.  Passed flatus this am. O - AVSS.    Abdomen - soft, nontender, nondistended.  Incisions clean, dry, intact.  Vaginal pad - essentially dry. A/P - S/P LAVH/BSO/collection of pelvic washings.  Urinary retention.  - Voiding trial this am.  If unable to void, will discharge with foley.  - Instructions and Rx already given to patient.  - Follow up in 4 weeks if discharged without foley.  Otherwise, follow up in office in 4 days.

## 2010-11-24 NOTE — Discharge Summary (Signed)
Admit Date -  11/22/10 Discharge Date - 11/24/10 Admit Dx -  Carrier of BRCA2 mutation Discharge Dx - Carrier of BRCA2 mutation, post op urinary retension Procedure -  Laparoscopically assisted vaginal hysterectomy, bilateral salpingo-oophorectomy, collection of pelvic washings History and Physical - 40 year old G2P2 female with recent diagnosis of carrier status of BRCA2 mutation and positive family history of mother, a survivor of ovarian cancer, who requests hysterectomy with removal of tubes and ovaries after genetic, oncologic, and gynecologic counseling.  Patient had a normal breast and pelvic exam.  Preop ultrasound showed a small simple left ovarian cyst and serum studies demonstrated a normal CA125. Hospital Course - Patient had an uncomplicated LAVH/BSO/collection of pelvic washings.  Her post op course was significant for urinary retention post op requiring foley replacement and an additional day of hospitalization.  Hgb 10.7.  Final pathology report pending at discharge. Discharge status - 1. Discharge to home. 2. Replace foley if unable to void. 3. Decreased activity for 6 weeks. 4. Percocet and ibuprophen rxs.  Do not resume tylenol, but can otherwise resume usual meds. 5. Call for fever, nausea and vomiting, incisional drainage, heavy vaginal bleeding, increasing pain, or inability to void. 6. Follow up in 4 weeks for routine post op visit.  Follow up in 4 days if discharged with foley.

## 2010-11-25 ENCOUNTER — Ambulatory Visit: Payer: BC Managed Care – PPO | Admitting: Oncology

## 2011-07-06 ENCOUNTER — Telehealth: Payer: Self-pay | Admitting: *Deleted

## 2011-07-06 ENCOUNTER — Other Ambulatory Visit: Payer: Self-pay | Admitting: Obstetrics and Gynecology

## 2011-07-06 NOTE — Telephone Encounter (Signed)
Call from [place] at Dr. Rica Records office. Pt had screening/imaging done with Dr. Rica Records office today. Dr. Edward Jolly requesting f/u with Dr. Welton Flakes. Pt FTKA 11/25/10  Will review with MD.

## 2011-07-19 ENCOUNTER — Telehealth: Payer: Self-pay | Admitting: Oncology

## 2011-07-19 ENCOUNTER — Other Ambulatory Visit: Payer: Self-pay | Admitting: Medical Oncology

## 2011-07-19 NOTE — Telephone Encounter (Signed)
lmonvm adviisng the pt of her aug 2013 appt

## 2011-09-08 ENCOUNTER — Other Ambulatory Visit: Payer: BC Managed Care – PPO | Admitting: Lab

## 2011-09-08 ENCOUNTER — Telehealth: Payer: Self-pay | Admitting: *Deleted

## 2011-09-08 ENCOUNTER — Ambulatory Visit (HOSPITAL_BASED_OUTPATIENT_CLINIC_OR_DEPARTMENT_OTHER): Payer: BC Managed Care – PPO | Admitting: Oncology

## 2011-09-08 ENCOUNTER — Encounter: Payer: Self-pay | Admitting: Oncology

## 2011-09-08 VITALS — BP 119/81 | HR 83 | Temp 97.8°F | Resp 20 | Ht 64.0 in | Wt 119.9 lb

## 2011-09-08 DIAGNOSIS — Z1509 Genetic susceptibility to other malignant neoplasm: Secondary | ICD-10-CM

## 2011-09-08 DIAGNOSIS — Z9079 Acquired absence of other genital organ(s): Secondary | ICD-10-CM

## 2011-09-08 DIAGNOSIS — Z1501 Genetic susceptibility to malignant neoplasm of breast: Secondary | ICD-10-CM

## 2011-09-08 HISTORY — DX: Genetic susceptibility to malignant neoplasm of breast: Z15.09

## 2011-09-08 HISTORY — DX: Genetic susceptibility to malignant neoplasm of breast: Z15.01

## 2011-09-08 MED ORDER — TAMOXIFEN CITRATE 20 MG PO TABS: 20.0000 mg | ORAL_TABLET | Freq: Every day | ORAL | Status: AC

## 2011-09-08 NOTE — Telephone Encounter (Signed)
Gave patient appointment for mri of the breast 10-11-2011 and 11-17-2011 starting at 11:30am lab and md

## 2011-09-08 NOTE — Progress Notes (Signed)
OFFICE PROGRESS NOTE  CC Dr. Creola Corn Dr. Conley Simmonds  DIAGNOSIS: 41 year-old female positive for BRCA2 mutation  PRIOR THERAPY:.  #1 patient was originally seen by me on 09/27/2010 for discussion of risk reducing strategies. At that time she was uncertain whether she wanted to undergo bilateral mastectomies or abdominal hysterectomy and bilateral salpingo-oophorectomy. However since that time patient has opted to have bilateral salpingo-oophorectomy performed. She however still has preservation of her breasts. Patient therefore is seen today for discussion of surveillance and prevention of breast cancer in a BRCA2 carrier.  CURRENT THERAPY:Tamoxifen 20 mg daily as chemopreventive for Breast cancer in a BRCA2 mutation carrier.  INTERVAL HISTORY: Dawn Boyd 41 y.o. female returns for Followup visit today. As stated above her last visit and first visit with me was on 09/27/2010. At that time we had an extensive discussion including her risk of developing breast cancer versus risk of developing ovarian cancer. Since then she has had bilateral salpingo-oophorectomies performed. This certainly will help her reduce her risk of developing ovarian cancer as well as reduce her risk of breast cancer as well. However she still has preservation of her breasts and she still remains at increased risk for developing breast cancer do to her BRCA2 status. She and I discussed extensively the role of chemotherapy prevention as well as surveillance today. Certainly she should undergo surveillance MRIs and mammograms as well as self breast examinations and clinical examinations. I also have recommended that she do tamoxifen 20 mg daily as a chemopreventive. Especially since there are new studies that have revealed that tamoxifen may be feasible in using as a chemopreventive for BRCA2 mutation carriers in prevention of breast cancer. Risks and benefits of tamoxifen were discussed with the patient today.  MEDICAL  HISTORY: Past Medical History  Diagnosis Date  . Heart murmur   . Seasonal allergies   . Headache   . GERD (gastroesophageal reflux disease)     tums prn  . Blood transfusion     pt states age 47 in [place] had problems with blood clotting, no further problems  . PONV (postoperative nausea and vomiting)     ALLERGIES:  is allergic to lactose intolerance (gi).  MEDICATIONS:  Current Outpatient Prescriptions  Medication Sig Dispense Refill  . rizatriptan (MAXALT) 10 MG tablet Take 5 mg by mouth as needed. May repeat in 2 hours if needed, migraine          SURGICAL HISTORY:  Past Surgical History  Procedure Date  . Nasal septum repair 2007  . Laparoscopic assisted vaginal hysterectomy 11/22/2010    Procedure: LAPAROSCOPIC ASSISTED VAGINAL HYSTERECTOMY;  Surgeon: Melony Overly;  Location: [facility];  Service: Gynecology;  Laterality: N/A;  . Salpingoophorectomy 11/22/2010    Procedure: SALPINGO OOPHERECTOMY;  Surgeon: Melony Overly;  Location: [facility];  Service: Gynecology;  Laterality: Bilateral;    REVIEW OF SYSTEMS:  A comprehensive review of systems was negative.   PHYSICAL EXAMINATION:   Bilateral breast examination was performed today patient has no evidence of masses bilaterally no nipple discharge no skin changes no nipple inversion or retraction. There was no evidence of palpable axillary lymph nodes  ECOG PERFORMANCE STATUS: 0 - Asymptomatic  Blood pressure 119/81, pulse 83, temperature 97.8 F (36.6 C), temperature source Oral, resp. rate 20, height 5\' 4"  (1.626 m), weight 119 lb 14.4 oz (54.386 kg).  LABORATORY DATA: Lab Results  Component Value Date   WBC 9.2 11/23/2010   HGB 10.7* 11/23/2010   HCT  29.9* 11/23/2010   MCV 86.4 11/23/2010   PLT 138* 11/23/2010      Chemistry      Component Value Date/Time   NA 137 11/23/2010 0512   K 3.9 11/23/2010 0512   CL 105 11/23/2010 0512   CO2 26 11/23/2010 0512   BUN 12 11/23/2010 0512   CREATININE 0.71 11/23/2010 0512        Component Value Date/Time   CALCIUM 8.6 11/23/2010 0512   ALKPHOS 44 11/23/2010 0512   AST 14 11/23/2010 0512   ALT 10 11/23/2010 0512   BILITOT 1.9* 11/23/2010 0512       RADIOGRAPHIC STUDIES:  No results found.  ASSESSMENT: 41 year old female BRCA 2 mutation carrier. She is now status post bilateral salpingo-oophorectomies. But she is uncertain about having bilateral mastectomies and she would like to preserve her breasts for as long as you she possibly can. We discussed extensively today overall surveillance with MRI and mammograms and self breast examinations and clinical examinations. We also discussed chemoprevention with use of tamoxifen 20 mg daily to help prevent breast cancer risk. She understands the risks and benefits of tamoxifen. She certainly is willing to give this a try although she is very adverse to taking any kind of medications. I did give her a prescription for tamoxifen and she will get it filled and begin this. In the meantime I will plan on seeing her back in 3 months time.   PLAN:   #1 tamoxifen 20 mg daily to be used as a chemopreventive to prevent breast cancer in a BRCA2 mutation carrier.  #2 patient will be seen back in 3 months time or sooner if need arises.   All questions were answered. The patient knows to call the clinic with any problems, questions or concerns. We can certainly see the patient much sooner if necessary.  I spent 25 minutes counseling the patient face to face. The total time spent in the appointment was 30 minutes.    Drue Second, MD Medical/Oncology [facility] [phone] (beeper) [phone] (Office)  09/08/2011, 11:04 AM

## 2011-09-08 NOTE — Patient Instructions (Addendum)
Tamoxifen 20 mg daily as prevention for breast cancer  I will see you back in 3 months for follow up  MRI of breasts for screening  Tamoxifen oral solution What is this medicine? TAMOXIFEN (ta MOX i fen) blocks the effects of estrogen. It is commonly used to treat breast cancer. It is also used to decrease the chance of breast cancer coming back in women who have received treatment for the disease. It may also help prevent breast cancer in women who have a high risk of developing breast cancer. This medicine may be used for other purposes; ask your health care provider or pharmacist if you have questions. What should I tell my health care provider before I take this medicine? They need to know if you have any of these conditions: -blood clots -blood disease -cataracts or impaired eyesight -endometriosis -high calcium levels -high cholesterol -irregular menstrual cycles -liver disease -stroke -uterine fibroids -an unusual or allergic reaction to tamoxifen, other medicines, foods, dyes, or preservatives -pregnant or trying to get pregnant -breast-feeding How should I use this medicine? Take this medicine by mouth with a glass of water. Follow the directions on the prescription label. You can take it with or without food. Take your medicine at regular intervals. Do not take your medicine more often than directed. Do not stop taking except on your doctor's advice. A special MedGuide will be given to you by the pharmacist with each prescription and refill. Be sure to read this information carefully each time. Talk to your pediatrician regarding the use of this medicine in children. While this drug may be prescribed for selected conditions, precautions do apply. Overdosage: If you think you have taken too much of this medicine contact a poison control center or emergency room at once. NOTE: This medicine is only for you. Do not share this medicine with others. What if I miss a dose? If you  miss a dose, take it as soon as you can. If it is almost time for your next dose, take only that dose. Do not take double or extra doses. What may interact with this medicine? -aminoglutethimide -bromocriptine -chemotherapy drugs -female hormones, like estrogens and birth control pills -letrozole -medroxyprogesterone -phenobarbital -rifampin -warfarin This list may not describe all possible interactions. Give your health care provider a list of all the medicines, herbs, non-prescription drugs, or dietary supplements you use. Also tell them if you smoke, drink alcohol, or use illegal drugs. Some items may interact with your medicine. What should I watch for while using this medicine? Visit your doctor or health care professional for regular checks on your progress. You will need regular pelvic exams, breast exams, and mammograms. If you are taking this medicine to reduce your risk of getting breast cancer, you should know that this medicine does not prevent all types of breast cancer. If breast cancer or other problems occur, there is no guarantee that it will be found at an early stage. Do not become pregnant while taking this medicine or for 2 months after stopping this medicine. Stop taking this medicine if you get pregnant or think you are pregnant and contact your doctor. This medicine may harm your unborn baby. Women who can possibly become pregnant should use birth control methods that do not use hormones during tamoxifen treatment and for 2 months after therapy has stopped. Talk with your health care provider for birth control advice. Do not breast feed while taking this medicine. What side effects may I notice from receiving this  medicine? Side effects that you should report to your doctor or health care professional as soon as possible: -allergic reactions like skin rash, itching or hives, swelling of the face, lips, or tongue -breathing problems -changes in vision -changes in your  menstrual cycle -difficulty walking or talking -new breast lumps -numbness -pelvic pain or pressure -redness, blistering, peeling or loosening of the skin, including inside the mouth -sudden chest pain -swelling, pain or tenderness in your calf or leg -unusual bruising or bleeding -vaginal discharge that is bloody, brown, or rust -weakness -yellowing of the whites of the eyes or skin Side effects that usually do not require medical attention (report to your doctor or health care professional if they continue or are bothersome): -fatigue -hair loss, although uncommon and is usually mild -headache -hot flashes -impotence (in men) -nausea, vomiting (mild) -vaginal discharge (white or clear) This list may not describe all possible side effects. Call your doctor for medical advice about side effects. You may report side effects to FDA at 1-800-FDA-1088. Where should I keep my medicine? Keep out of the reach of children. Store in the original package at room temperature between 20 and 25 degrees C (68 and 77 degrees F). Do not store above 25 degrees C (77 degrees F). DO NOT freeze or refrigerate. Protect from light. Keep container tightly closed. Use within 3 months of opening. Throw away any unused medicine after the expiration date. NOTE: This sheet is a summary. It may not cover all possible information. If you have questions about this medicine, talk to your doctor, pharmacist, or health care provider.  2012, Elsevier/Gold Standard. (09/30/2007 3:48:08 PM)

## 2011-10-11 ENCOUNTER — Other Ambulatory Visit (HOSPITAL_COMMUNITY): Payer: BC Managed Care – PPO

## 2011-11-15 ENCOUNTER — Telehealth: Payer: Self-pay | Admitting: Oncology

## 2011-11-15 NOTE — Telephone Encounter (Signed)
Pt called to cancel her appts for 11/17/2011

## 2011-11-17 ENCOUNTER — Ambulatory Visit: Payer: BC Managed Care – PPO | Admitting: Oncology

## 2011-11-17 ENCOUNTER — Other Ambulatory Visit: Payer: BC Managed Care – PPO | Admitting: Lab

## 2012-07-08 ENCOUNTER — Other Ambulatory Visit: Payer: Self-pay | Admitting: Obstetrics and Gynecology

## 2012-07-08 DIAGNOSIS — Z1509 Genetic susceptibility to other malignant neoplasm: Secondary | ICD-10-CM

## 2012-07-08 DIAGNOSIS — Z1231 Encounter for screening mammogram for malignant neoplasm of breast: Secondary | ICD-10-CM

## 2012-07-08 DIAGNOSIS — E8940 Asymptomatic postprocedural ovarian failure: Secondary | ICD-10-CM

## 2012-07-08 DIAGNOSIS — Z1501 Genetic susceptibility to malignant neoplasm of breast: Secondary | ICD-10-CM

## 2012-07-29 ENCOUNTER — Ambulatory Visit
Admission: RE | Admit: 2012-07-29 | Discharge: 2012-07-29 | Disposition: A | Discharge status: Home / Self Care | Payer: BC Managed Care – PPO | Source: Ambulatory Visit | Attending: Obstetrics and Gynecology | Admitting: Obstetrics and Gynecology

## 2012-07-29 DIAGNOSIS — Z1501 Genetic susceptibility to malignant neoplasm of breast: Secondary | ICD-10-CM

## 2012-07-29 DIAGNOSIS — E8940 Asymptomatic postprocedural ovarian failure: Secondary | ICD-10-CM

## 2012-07-29 DIAGNOSIS — Z1231 Encounter for screening mammogram for malignant neoplasm of breast: Secondary | ICD-10-CM

## 2012-07-29 DIAGNOSIS — Z1509 Genetic susceptibility to other malignant neoplasm: Secondary | ICD-10-CM

## 2012-12-17 ENCOUNTER — Other Ambulatory Visit: Payer: Self-pay | Admitting: Dermatology

## 2013-03-03 ENCOUNTER — Other Ambulatory Visit: Payer: Self-pay

## 2013-03-03 DIAGNOSIS — Z1231 Encounter for screening mammogram for malignant neoplasm of breast: Secondary | ICD-10-CM

## 2013-05-21 ENCOUNTER — Encounter: Payer: Self-pay | Admitting: Internal Medicine

## 2013-07-11 ENCOUNTER — Other Ambulatory Visit: Payer: Self-pay | Admitting: Obstetrics and Gynecology

## 2013-07-14 ENCOUNTER — Other Ambulatory Visit: Payer: Self-pay | Admitting: Obstetrics and Gynecology

## 2013-07-14 DIAGNOSIS — N6453 Retraction of nipple: Secondary | ICD-10-CM

## 2013-07-14 DIAGNOSIS — Z1501 Genetic susceptibility to malignant neoplasm of breast: Secondary | ICD-10-CM

## 2013-07-14 DIAGNOSIS — Z1509 Genetic susceptibility to other malignant neoplasm: Secondary | ICD-10-CM

## 2013-07-14 LAB — CYTOLOGY - PAP

## 2013-07-16 ENCOUNTER — Encounter: Payer: Self-pay | Admitting: Internal Medicine

## 2013-07-22 ENCOUNTER — Encounter: Payer: Self-pay | Admitting: Internal Medicine

## 2013-07-22 ENCOUNTER — Other Ambulatory Visit (INDEPENDENT_AMBULATORY_CARE_PROVIDER_SITE_OTHER): Payer: BC Managed Care – PPO

## 2013-07-22 ENCOUNTER — Ambulatory Visit (INDEPENDENT_AMBULATORY_CARE_PROVIDER_SITE_OTHER): Payer: BC Managed Care – PPO | Admitting: Internal Medicine

## 2013-07-22 VITALS — BP 118/70 | HR 78 | Ht 64.0 in | Wt 120.0 lb

## 2013-07-22 DIAGNOSIS — K589 Irritable bowel syndrome without diarrhea: Secondary | ICD-10-CM

## 2013-07-22 DIAGNOSIS — R195 Other fecal abnormalities: Secondary | ICD-10-CM

## 2013-07-22 DIAGNOSIS — K594 Anal spasm: Secondary | ICD-10-CM

## 2013-07-22 DIAGNOSIS — Z1509 Genetic susceptibility to other malignant neoplasm: Secondary | ICD-10-CM

## 2013-07-22 DIAGNOSIS — K625 Hemorrhage of anus and rectum: Secondary | ICD-10-CM

## 2013-07-22 DIAGNOSIS — Z1501 Genetic susceptibility to malignant neoplasm of breast: Secondary | ICD-10-CM

## 2013-07-22 DIAGNOSIS — K824 Cholesterolosis of gallbladder: Secondary | ICD-10-CM

## 2013-07-22 LAB — COMPREHENSIVE METABOLIC PANEL
ALBUMIN: 4.5 g/dL (ref 3.5–5.2)
ALT: 19 U/L (ref 0–35)
AST: 23 U/L (ref 0–37)
Alkaline Phosphatase: 89 U/L (ref 39–117)
BUN: 16 mg/dL (ref 6–23)
CALCIUM: 9.8 mg/dL (ref 8.4–10.5)
CHLORIDE: 103 meq/L (ref 96–112)
CO2: 30 mEq/L (ref 19–32)
Creatinine, Ser: 0.8 mg/dL (ref 0.4–1.2)
GFR: 78.44 mL/min (ref >60.00)
GLUCOSE: 105 mg/dL — AB (ref 70–99)
Potassium: 4.3 mEq/L (ref 3.5–5.1)
Sodium: 138 mEq/L (ref 135–145)
Total Bilirubin: 2.1 mg/dL — ABNORMAL HIGH (ref 0.2–1.2)
Total Protein: 7.3 g/dL (ref 6.0–8.3)

## 2013-07-22 LAB — CBC
HCT: 39.6 % (ref 36.0–46.0)
Hemoglobin: 13.6 g/dL (ref 12.0–15.0)
MCHC: 34.3 g/dL (ref 30.0–36.0)
MCV: 87.7 fl (ref 78.0–100.0)
Platelets: 227 10*3/uL (ref 150.0–400.0)
RBC: 4.51 Mil/uL (ref 3.87–5.11)
RDW: 11.9 % (ref 11.5–15.5)
WBC: 4.8 10*3/uL (ref 4.0–10.5)

## 2013-07-22 LAB — IGA: IgA: 145 mg/dL (ref 68–378)

## 2013-07-22 LAB — TSH: TSH: 0.84 u[IU]/mL (ref 0.35–4.50)

## 2013-07-22 MED ORDER — HYOSCYAMINE SULFATE 0.125 MG SL SUBL: 0.2500 mg | SUBLINGUAL_TABLET | SUBLINGUAL | Status: DC | PRN

## 2013-07-22 MED ORDER — MOVIPREP 100 G PO SOLR: ORAL | Status: DC

## 2013-07-22 MED ORDER — ALIGN PO CAPS: 1.0000 | ORAL_CAPSULE | Freq: Every day | ORAL | Status: DC

## 2013-07-22 NOTE — Progress Notes (Signed)
Patient ID: Dawn Boyd, female   DOB: [birth year], 43 y.o.   MRN: [id number] HPI: Dawn Boyd is a 43 yo female with PMH of BRCA2 + mutation s/p bilateral salpingo-oophorectomy currently undergoing surveillance for breast cancer with decision against prophylactic mastectomy at this time who is seen to evaluate fecal urgency, intermittent rectal pain, intermittent rectal bleeding. She is here alone today. She reports a somewhat long-standing history of alternating loose stools with more normal formed stool. She reports she has "good and bad days". On bad days she reports loose stools, worse postprandially which can be urgent. She does have a history of "hemorrhoids" and sees occasional bright red blood on the toilet tissue. She reports anxiety or "nerves" can make the loose stools worse. With her loose stool she can have lower abdominal cramping pain. She often has to rush to the bathroom for bowel movement on her "bad days". Separate from this she has episodic rectal pain and spasm. This can last 30 minutes and is described as intense pain that can make her feel as if she will pass out. No previous syncope. This occurs approximately once every 3 months. If she is at home she takes a hot bath which improved the pain. She reports lactose intolerance and so she avoids lactose. She has use Lactaid tablets. Lipase-containing foods causes loose stools, nausea and gas and bloating. She does occasionally have heartburn and has used [employer]. She uses Tylenol for headache but denies frequent NSAID use. She does have a history of bulimia in college, but denies nausea or vomiting at present. No loss of weight. She reports a history of a gallbladder polyp seen on ultrasound greater than 5 years ago. She did consider taking tamoxifen for prophylaxis given her BRCA2 gene positivity, but decided against it.  She denies a family history of celiac disease, or GI tract malignancy. She does have a family history of irritable bowel in  her mother.    Past Medical History  Diagnosis Date  . Heart murmur   . Seasonal allergies   . Headache(784.0)   . GERD (gastroesophageal reflux disease)     tums prn  . Blood transfusion     pt states age 32 in [place] had problems with blood clotting, no further problems  . PONV (postoperative nausea and vomiting)   . BRCA2 positive 09/08/2011    Past Surgical History  Procedure Laterality Date  . Nasal septum repair  2007  . Laparoscopic assisted vaginal hysterectomy  11/22/2010    Procedure: LAPAROSCOPIC ASSISTED VAGINAL HYSTERECTOMY;  Surgeon: Arloa Koh;  Location: [facility];  Service: Gynecology;  Laterality: N/A;  . Salpingoophorectomy  11/22/2010    Procedure: SALPINGO OOPHERECTOMY;  Surgeon: Arloa Koh;  Location: [facility];  Service: Gynecology;  Laterality: Bilateral;    Outpatient Prescriptions Prior to Visit  Medication Sig Dispense Refill  . rizatriptan (MAXALT) 10 MG tablet Take 5 mg by mouth as needed. May repeat in 2 hours if needed, migraine         No facility-administered medications prior to visit.    Allergies  Allergen Reactions  . Lactose Intolerance (Gi)     History reviewed. No pertinent family history.  History  Substance Use Topics  . Smoking status: Never Smoker   . Smokeless tobacco: Not on file  . Alcohol Use: Yes    ROS: As per history of present illness, otherwise negative  BP 118/70  Pulse 78  Ht _0  (1.626 m)  Wt 120 lb (54.432  kg)  BMI 20.59 kg/m2 Constitutional: Well-developed and well-nourished. No distress. HEENT: Normocephalic and atraumatic. Oropharynx is clear and moist. No oropharyngeal exudate. Conjunctivae are normal.  No scleral icterus. Neck: Neck supple. Trachea midline. Cardiovascular: Normal rate, regular rhythm and intact distal pulses.  Pulmonary/chest: Effort normal and breath sounds normal. No wheezing, rales or rhonchi. Abdominal: Soft, nontender, nondistended. Bowel sounds active throughout. There are  no masses palpable. No hepatosplenomegaly. Extremities: no clubbing, cyanosis, or edema Lymphadenopathy: No cervical adenopathy noted. Neurological: Alert and oriented to person place and time. Skin: Skin is warm and dry. No rashes noted. Psychiatric: Normal mood and affect. Behavior is normal.  ASSESSMENT/PLAN: 43 yo female with PMH of BRCA2 + mutation s/p bilateral salpingo-oophorectomy currently undergoing surveillance for breast cancer with decision against prophylactic mastectomy at this time who is seen to evaluate fecal urgency, intermittent rectal pain, intermittent rectal bleeding.  1.  IBS/rectal bleeding/loose stools/rectal pain and spasm -- some of her symptoms are consistent with irritable bowel. I feel it prudent to rule out celiac disease and celiac panel checked today. Also check TSH. Her rectal bleeding is not consistent with IBS, but could be hemorrhoid related. She also has symptoms consistent with proctalgia fugax. Given her constellation of symptoms, I recommended colonoscopy for direct visualization of the colon. This will rule out inflammation as a cause for bleeding. I have recommended a daily probiotic with Align 1 capsule daily and also given her a prescription for Levsin to be used as needed for rectal pain and spasm, along with her loose stools with lower abdominal discomfort. CBC and CMP today.  2.  Hx of gallbladder polyp -- abdominal ultrasound to evaluate gallbladder polyp. If this is increasing in size, she may require cholecystectomy.  3.  Hx of BRCA2 -- status post bilateral salpingo-oophorectomy. She is on a surveillance and screening planned for breast cancer.

## 2013-07-22 NOTE — Patient Instructions (Addendum)
You have been referred to [facility] Located [place], [place], [place] [address]  (directly across the street) [phone] You have an appointment on 08/01/2013 @ 2:30pm. If you cannot make this appointment you can call them at the number above and reschedule.  You have been scheduled for a colonoscopy. Please follow written instructions given to you at your visit today.  Please pick up your prep kit at the pharmacy within the next 1-3 days. If you use inhalers (even only as needed), please bring them with you on the day of your procedure. Your physician has requested that you go to www.startemmi.com and enter the access code given to you at your visit today. This web site gives a general overview about your procedure. However, you should still follow specific instructions given to you by our office regarding your preparation for the procedure.  Your physician has requested that you go to the basement for  lab work before leaving today.   We have sent the following medications to your pharmacy for you to pick up at your convenience: levsin, take as directed. Moviprep; you were given instructions today at you office visit

## 2013-07-23 ENCOUNTER — Encounter: Payer: Self-pay | Admitting: Internal Medicine

## 2013-07-23 LAB — TISSUE TRANSGLUTAMINASE, IGA: Tissue Transglutaminase Ab, IgA: 4.3 U/mL (ref <20)

## 2013-07-23 NOTE — Telephone Encounter (Signed)
Okay for zofran 4 mg PO 1 hour before procedure

## 2013-07-29 ENCOUNTER — Telehealth: Payer: Self-pay | Admitting: Internal Medicine

## 2013-07-29 NOTE — Telephone Encounter (Signed)
Pt does not have prescription for nausea but plans on taking dramamine if needed OTC. RN reminded pt to be NPO for at least 3 hours before procedure time. RN advised pt nausea is not typical with MAC (like it can be with general anesthesia) nevertheless iv meds are available to nurse anethetist if needed.  Pt voiced satisfaction with interaction.

## 2013-07-30 ENCOUNTER — Ambulatory Visit
Admission: RE | Admit: 2013-07-30 | Discharge: 2013-07-30 | Disposition: A | Discharge status: Home / Self Care | Payer: BC Managed Care – PPO | Source: Ambulatory Visit | Attending: Obstetrics and Gynecology | Admitting: Obstetrics and Gynecology

## 2013-07-30 ENCOUNTER — Ambulatory Visit: Payer: BC Managed Care – PPO

## 2013-07-30 ENCOUNTER — Other Ambulatory Visit: Payer: BC Managed Care – PPO

## 2013-07-30 DIAGNOSIS — Z1501 Genetic susceptibility to malignant neoplasm of breast: Secondary | ICD-10-CM

## 2013-07-30 DIAGNOSIS — Z1509 Genetic susceptibility to other malignant neoplasm: Secondary | ICD-10-CM

## 2013-07-30 DIAGNOSIS — N6453 Retraction of nipple: Secondary | ICD-10-CM

## 2013-08-04 ENCOUNTER — Ambulatory Visit (AMBULATORY_SURGERY_CENTER): Payer: BC Managed Care – PPO | Admitting: Internal Medicine

## 2013-08-04 ENCOUNTER — Encounter: Payer: Self-pay | Admitting: Internal Medicine

## 2013-08-04 ENCOUNTER — Other Ambulatory Visit: Payer: Self-pay

## 2013-08-04 VITALS — BP 143/84 | HR 69 | Temp 96.9°F | Resp 12 | Ht 64.0 in | Wt 120.0 lb

## 2013-08-04 DIAGNOSIS — K625 Hemorrhage of anus and rectum: Secondary | ICD-10-CM

## 2013-08-04 DIAGNOSIS — K6289 Other specified diseases of anus and rectum: Secondary | ICD-10-CM

## 2013-08-04 DIAGNOSIS — K824 Cholesterolosis of gallbladder: Secondary | ICD-10-CM

## 2013-08-04 MED ORDER — SODIUM CHLORIDE 0.9 % IV SOLN: 500.0000 mL | INTRAVENOUS | Status: DC

## 2013-08-04 NOTE — Progress Notes (Unsigned)
Patient is scheduled for 08/08/13 10:00 at [facility].  6 hours NPO Left message for patient to call back

## 2013-08-04 NOTE — Progress Notes (Signed)
Procedure ends, to recovery, report given and VSS. 

## 2013-08-04 NOTE — Patient Instructions (Signed)
YOU HAD AN ENDOSCOPIC PROCEDURE TODAY AT THE [facility]: Refer to the procedure report that was given to you for any specific questions about what was found during the examination.  If the procedure report does not answer your questions, please call your gastroenterologist to clarify.  If you requested that your care partner not be given the details of your procedure findings, then the procedure report has been included in a sealed envelope for you to review at your convenience later.  YOU SHOULD EXPECT: Some feelings of bloating in the abdomen. Passage of more gas than usual.  Walking can help get rid of the air that was put into your GI tract during the procedure and reduce the bloating. If you had a lower endoscopy (such as a colonoscopy or flexible sigmoidoscopy) you may notice spotting of blood in your stool or on the toilet paper. If you underwent a bowel prep for your procedure, then you may not have a normal bowel movement for a few days.  DIET: Your first meal following the procedure should be a light meal and then it is ok to progress to your normal diet.  A half-sandwich or bowl of soup is an example of a good first meal.  Heavy or fried foods are harder to digest and may make you feel nauseous or bloated.  Likewise meals heavy in dairy and vegetables can cause extra gas to form and this can also increase the bloating.  Drink plenty of fluids but you should avoid alcoholic beverages for 24 hours.  ACTIVITY: Your care partner should take you home directly after the procedure.  You should plan to take it easy, moving slowly for the rest of the day.  You can resume normal activity the day after the procedure however you should NOT DRIVE or use heavy machinery for 24 hours (because of the sedation medicines used during the test).    SYMPTOMS TO REPORT IMMEDIATELY: A gastroenterologist can be reached at any hour.  During normal business hours, 8:30 AM to 5:00 PM Monday through Friday,  call [phone].  After hours and on weekends, please call the GI answering service at [phone] who will take a message and have the physician on call contact you.   Following lower endoscopy (colonoscopy or flexible sigmoidoscopy):  Excessive amounts of blood in the stool  Significant tenderness or worsening of abdominal pains  Swelling of the abdomen that is new, acute  Fever of 100F or higher  FOLLOW UP: If any biopsies were taken you will be contacted by phone or by letter within the next 1-3 weeks.  Call your gastroenterologist if you have not heard about the biopsies in 3 weeks.  Our staff will call the home number listed on your records the next business day following your procedure to check on you and address any questions or concerns that you may have at that time regarding the information given to you following your procedure. This is a courtesy call and so if there is no answer at the home number and we have not heard from you through the emergency physician on call, we will assume that you have returned to your regular daily activities without incident.  SIGNATURES/CONFIDENTIALITY: You and/or your care partner have signed paperwork which will be entered into your electronic medical record.  These signatures attest to the fact that that the information above on your After Visit Summary has been reviewed and is understood.  Full responsibility of the confidentiality of this   discharge information lies with you and/or your care-partner.  Resume medications.

## 2013-08-04 NOTE — Op Note (Signed)
[place]  [address] [place], [id number]   COLONOSCOPY PROCEDURE REPORT  PATIENT: Dawn, Boyd  MR#: [id number] BIRTHDATE: July 10, 1970 , 42  yrs. old GENDER: Female ENDOSCOPIST: Jerene Bears, MD PROCEDURE DATE:  08/04/2013 PROCEDURE:   Colonoscopy, diagnostic First Screening Colonoscopy - Avg.  risk and is 50 yrs.  old or older - No.  Prior Negative Screening - Now for repeat screening. N/A  History of Adenoma - Now for follow-up colonoscopy & has been > or = to 3 yrs.  N/A  Polyps Removed Today? No.  Recommend repeat exam, <10 yrs? No. ASA CLASS:   Class II INDICATIONS:Rectal Bleeding.   Loose stools.  Rectal pain/spasm. MEDICATIONS: MAC sedation, administered by CRNA and propofol (Diprivan) 484m IV  DESCRIPTION OF PROCEDURE:   After the risks benefits and alternatives of the procedure were thoroughly explained, informed consent was obtained.  A digital rectal exam revealed no rectal mass.   The LB [id number]-[id number] endoscope was introduced through the anus and advanced to the cecum, which was identified by both the appendix and ileocecal valve. No adverse events experienced. The quality of the prep was good, using MoviPrep  The instrument was then slowly withdrawn as the colon was fully examined.   COLON FINDINGS: A normal appearing cecum, ileocecal valve, and appendiceal orifice were identified.  The ascending, hepatic flexure, transverse, splenic flexure, descending, sigmoid colon and rectum appeared unremarkable.  No polyps or cancers were seen. Retroflexed views revealed no abnormalities. The time to cecum=4 minutes 58 seconds.  Withdrawal time=9 minutes 19 seconds.  The scope was withdrawn and the procedure completed.  COMPLICATIONS: There were no complications.  ENDOSCOPIC IMPRESSION: Normal colon  RECOMMENDATIONS: 1.  Continue Levsin as needed for cramping pain/spasm.  Trial of probiotic with Align one capsule daily. 2.  If rectal spasm  fails to improve, trial of calcium channel blocker can be considered 3.  You should continue to follow colorectal cancer screening guidelines for "routine risk" patients with a repeat colonoscopy in 10 years.  There is no need for FOBT (stool) testing for at least 5 years.   eSigned:  JJerene Bears MD 08/04/2013 10:35 AM  cc: The Patient and JShon Baton MD

## 2013-08-05 ENCOUNTER — Telehealth: Payer: Self-pay | Admitting: *Deleted

## 2013-08-05 NOTE — Telephone Encounter (Signed)
No answer, message left for the patient.

## 2013-08-07 ENCOUNTER — Telehealth: Payer: Self-pay

## 2013-08-07 DIAGNOSIS — Z1509 Genetic susceptibility to other malignant neoplasm: Principal | ICD-10-CM

## 2013-08-07 DIAGNOSIS — Z1501 Genetic susceptibility to malignant neoplasm of breast: Secondary | ICD-10-CM

## 2013-08-07 NOTE — Telephone Encounter (Signed)
Patient notified.  She is scheduled for CT at [facility].  She is advised to arrive at 9:15 to begin drinking contrast at 9:30 for CT at 10:00.

## 2013-08-07 NOTE — Telephone Encounter (Signed)
Left message for patient to call back to discuss.

## 2013-08-07 NOTE — Telephone Encounter (Signed)
Message copied by Marlon Pel on Thu Aug 07, 2013  3:38 PM ------      Message from: Jerene Bears      Created: Thu Aug 07, 2013  1:44 PM      Regarding: Panc cancer screening       I have researched further regarding abdominal imaging for pancreatic cancer screening based on her history of BRCA gene mutation.      --There is data to support her elevated risk of pancreatic cancer given the BRCA gene mutation      --There are no formal guidelines, but data suggest consideration of screening patients older than age 70.      --She expressed interest to me at her office visit and after her recent colonoscopy regarding abdominal imaging for pancreatic cancer screening      --Given her interest in the known association of pancreatic cancer with BRCA mutation, please order abdominal CT scan with contrast, pancreas protocol. Indication pancreatic cancer screening in the setting of BRCA gene mutation.      --Pt will need to be notified            Thanks      JMP       ------

## 2013-08-08 ENCOUNTER — Ambulatory Visit (HOSPITAL_COMMUNITY)
Admission: RE | Admit: 2013-08-08 | Discharge: 2013-08-08 | Disposition: A | Discharge status: Home / Self Care | Payer: BC Managed Care – PPO | Source: Ambulatory Visit | Attending: Internal Medicine | Admitting: Internal Medicine

## 2013-08-08 DIAGNOSIS — K824 Cholesterolosis of gallbladder: Secondary | ICD-10-CM | POA: Insufficient documentation

## 2013-08-14 ENCOUNTER — Ambulatory Visit (INDEPENDENT_AMBULATORY_CARE_PROVIDER_SITE_OTHER)
Admission: RE | Admit: 2013-08-14 | Discharge: 2013-08-14 | Disposition: A | Discharge status: Home / Self Care | Payer: BC Managed Care – PPO | Source: Ambulatory Visit | Attending: Internal Medicine | Admitting: Internal Medicine

## 2013-08-14 DIAGNOSIS — Z1502 Genetic susceptibility to malignant neoplasm of ovary: Secondary | ICD-10-CM

## 2013-08-14 DIAGNOSIS — Z1509 Genetic susceptibility to other malignant neoplasm: Principal | ICD-10-CM

## 2013-08-14 DIAGNOSIS — Z1501 Genetic susceptibility to malignant neoplasm of breast: Secondary | ICD-10-CM

## 2013-08-14 MED ORDER — IOHEXOL 300 MG/ML  SOLN
100.0000 mL | Freq: Once | INTRAMUSCULAR | Status: AC | PRN
  Administered 2013-08-14: 100 mL via INTRAVENOUS

## 2013-08-21 ENCOUNTER — Encounter (HOSPITAL_COMMUNITY): Payer: Self-pay | Admitting: Emergency Medicine

## 2013-08-21 ENCOUNTER — Emergency Department (HOSPITAL_COMMUNITY): Payer: BC Managed Care – PPO

## 2013-08-21 ENCOUNTER — Emergency Department (HOSPITAL_COMMUNITY)
Admission: EM | Admit: 2013-08-21 | Discharge: 2013-08-21 | Disposition: A | Discharge status: Home / Self Care | Payer: BC Managed Care – PPO | Attending: Emergency Medicine | Admitting: Emergency Medicine

## 2013-08-21 DIAGNOSIS — R197 Diarrhea, unspecified: Secondary | ICD-10-CM | POA: Insufficient documentation

## 2013-08-21 DIAGNOSIS — Z79899 Other long term (current) drug therapy: Secondary | ICD-10-CM | POA: Insufficient documentation

## 2013-08-21 DIAGNOSIS — R0602 Shortness of breath: Secondary | ICD-10-CM | POA: Insufficient documentation

## 2013-08-21 DIAGNOSIS — R1110 Vomiting, unspecified: Secondary | ICD-10-CM | POA: Insufficient documentation

## 2013-08-21 DIAGNOSIS — R011 Cardiac murmur, unspecified: Secondary | ICD-10-CM | POA: Insufficient documentation

## 2013-08-21 DIAGNOSIS — Z862 Personal history of diseases of the blood and blood-forming organs and certain disorders involving the immune mechanism: Secondary | ICD-10-CM | POA: Insufficient documentation

## 2013-08-21 DIAGNOSIS — Z8659 Personal history of other mental and behavioral disorders: Secondary | ICD-10-CM | POA: Insufficient documentation

## 2013-08-21 DIAGNOSIS — Z7982 Long term (current) use of aspirin: Secondary | ICD-10-CM | POA: Insufficient documentation

## 2013-08-21 DIAGNOSIS — R1012 Left upper quadrant pain: Secondary | ICD-10-CM | POA: Insufficient documentation

## 2013-08-21 DIAGNOSIS — Z8719 Personal history of other diseases of the digestive system: Secondary | ICD-10-CM | POA: Insufficient documentation

## 2013-08-21 LAB — COMPREHENSIVE METABOLIC PANEL
ALBUMIN: 5 g/dL (ref 3.5–5.2)
ALK PHOS: 104 U/L (ref 39–117)
ALT: 15 U/L (ref 0–35)
AST: 23 U/L (ref 0–37)
Anion gap: 14 (ref 5–15)
BILIRUBIN TOTAL: 2.1 mg/dL — AB (ref 0.3–1.2)
BUN: 15 mg/dL (ref 6–23)
CHLORIDE: 103 meq/L (ref 96–112)
CO2: 25 mEq/L (ref 19–32)
Calcium: 10.2 mg/dL (ref 8.4–10.5)
Creatinine, Ser: 0.82 mg/dL (ref 0.50–1.10)
GFR calc Af Amer: >90 mL/min (ref >90)
GFR calc non Af Amer: 86 mL/min — ABNORMAL LOW (ref >90)
Glucose, Bld: 93 mg/dL (ref 70–99)
POTASSIUM: 3.9 meq/L (ref 3.7–5.3)
Sodium: 142 mEq/L (ref 137–147)
Total Protein: 8.4 g/dL — ABNORMAL HIGH (ref 6.0–8.3)

## 2013-08-21 LAB — CBC WITH DIFFERENTIAL/PLATELET
BASOS PCT: 0 % (ref 0–1)
Basophils Absolute: 0 10*3/uL (ref 0.0–0.1)
Eosinophils Absolute: 0 10*3/uL (ref 0.0–0.7)
Eosinophils Relative: 1 % (ref 0–5)
HCT: 40.1 % (ref 36.0–46.0)
HEMOGLOBIN: 14.3 g/dL (ref 12.0–15.0)
Lymphocytes Relative: 36 % (ref 12–46)
Lymphs Abs: 1.7 10*3/uL (ref 0.7–4.0)
MCH: 29.9 pg (ref 26.0–34.0)
MCHC: 35.7 g/dL (ref 30.0–36.0)
MCV: 83.7 fL (ref 78.0–100.0)
MONOS PCT: 6 % (ref 3–12)
Monocytes Absolute: 0.3 10*3/uL (ref 0.1–1.0)
NEUTROS ABS: 2.7 10*3/uL (ref 1.7–7.7)
NEUTROS PCT: 57 % (ref 43–77)
Platelets: 221 10*3/uL (ref 150–400)
RBC: 4.79 MIL/uL (ref 3.87–5.11)
RDW: 11.3 % — ABNORMAL LOW (ref 11.5–15.5)
WBC: 4.7 10*3/uL (ref 4.0–10.5)

## 2013-08-21 LAB — I-STAT TROPONIN, ED: Troponin i, poc: 0 ng/mL (ref 0.00–0.08)

## 2013-08-21 LAB — URINALYSIS, ROUTINE W REFLEX MICROSCOPIC
BILIRUBIN URINE: NEGATIVE
Glucose, UA: NEGATIVE mg/dL
HGB URINE DIPSTICK: NEGATIVE
KETONES UR: NEGATIVE mg/dL
Nitrite: NEGATIVE
PH: 6.5 (ref 5.0–8.0)
Protein, ur: NEGATIVE mg/dL
Specific Gravity, Urine: 1.012 (ref 1.005–1.030)
Urobilinogen, UA: 0.2 mg/dL (ref 0.0–1.0)

## 2013-08-21 LAB — I-STAT CG4 LACTIC ACID, ED: Lactic Acid, Venous: 0.61 mmol/L (ref 0.5–2.2)

## 2013-08-21 LAB — URINE MICROSCOPIC-ADD ON

## 2013-08-21 LAB — LIPASE, BLOOD: LIPASE: 44 U/L (ref 11–59)

## 2013-08-21 MED ORDER — ONDANSETRON HCL 4 MG/2ML IJ SOLN
4.0000 mg | Freq: Once | INTRAMUSCULAR | Status: AC
  Administered 2013-08-21: 4 mg via INTRAVENOUS
  Filled 2013-08-21: qty 2

## 2013-08-21 MED ORDER — HYDROCODONE-ACETAMINOPHEN 5-325 MG PO TABS: 1.0000 | ORAL_TABLET | Freq: Four times a day (QID) | ORAL | Status: DC | PRN

## 2013-08-21 MED ORDER — IOHEXOL 350 MG/ML SOLN
100.0000 mL | Freq: Once | INTRAVENOUS | Status: AC | PRN
  Administered 2013-08-21: 100 mL via INTRAVENOUS

## 2013-08-21 MED ORDER — SODIUM CHLORIDE 0.9 % IV BOLUS (SEPSIS)
1000.0000 mL | Freq: Once | INTRAVENOUS | Status: AC
  Administered 2013-08-21: 1000 mL via INTRAVENOUS

## 2013-08-21 MED ORDER — HYDROMORPHONE HCL PF 1 MG/ML IJ SOLN
1.0000 mg | Freq: Once | INTRAMUSCULAR | Status: AC
  Administered 2013-08-21: 1 mg via INTRAVENOUS
  Filled 2013-08-21: qty 1

## 2013-08-21 NOTE — ED Provider Notes (Signed)
CSN: [id number]     Arrival date & time 08/21/13  1332 History   First MD Initiated Contact with Patient 08/21/13 1453     Chief Complaint  Patient presents with  . Abdominal Pain  . Nausea     (Consider location/radiation/quality/duration/timing/severity/associated sxs/prior Treatment) Patient is a 43 y.o. female presenting with abdominal pain. The history is provided by the patient. A language interpreter was used.  Abdominal Pain Pain location:  LUQ Pain quality: sharp   Pain radiates to:  Back and L shoulder Pain severity:  Moderate Onset quality:  Unable to specify Duration:  3 days Timing:  Constant Progression:  Waxing and waning Chronicity:  New Relieved by:  Belching and position changes Worsened by:  Movement and eating Ineffective treatments:  OTC medications Associated symptoms: diarrhea, nausea, shortness of breath and vomiting   Associated symptoms: no chest pain, no chills, no constipation, no cough, no dysuria, no fatigue, no fever and no sore throat   Risk factors: no NSAID use, not obese and no recent hospitalization     Past Medical History  Diagnosis Date  . Heart murmur   . Seasonal allergies   . Headache(784.0)   . GERD (gastroesophageal reflux disease)     tums prn  . Blood transfusion     pt states age 64 in [place] had problems with blood clotting, no further problems  . PONV (postoperative nausea and vomiting)   . BRCA2 positive 09/08/2011  . Blood transfusion without reported diagnosis   . Allergy   . Osteopenia     MILD  . Clotting disorder     THROMOCYTOPENIA  AGE 10  . Depression     AFTER CHILD BIRTH   Past Surgical History  Procedure Laterality Date  . Nasal septum repair  2007  . Laparoscopic assisted vaginal hysterectomy  11/22/2010    Procedure: LAPAROSCOPIC ASSISTED VAGINAL HYSTERECTOMY;  Surgeon: Dawn Boyd;  Location: [facility];  Service: Gynecology;  Laterality: N/A;  . Salpingoophorectomy  11/22/2010    Procedure: SALPINGO  OOPHERECTOMY;  Surgeon: Dawn Boyd;  Location: [facility];  Service: Gynecology;  Laterality: Bilateral;   Family History  Problem Relation Age of Onset  . Colon cancer Neg Hx   . Esophageal cancer Neg Hx   . Pancreatic cancer Neg Hx   . Rectal cancer Neg Hx    History  Substance Use Topics  . Smoking status: Never Smoker   . Smokeless tobacco: Not on file  . Alcohol Use: 4.2 oz/week    7 Cans of beer per week   OB History   Grav Para Term Preterm Abortions TAB SAB Ect Mult Living                 Review of Systems  Constitutional: Negative for fever, chills, diaphoresis, activity change, appetite change and fatigue.  HENT: Negative for congestion, facial swelling, rhinorrhea and sore throat.   Eyes: Negative for photophobia and discharge.  Respiratory: Positive for shortness of breath. Negative for cough and chest tightness.   Cardiovascular: Negative for chest pain, palpitations and leg swelling.  Gastrointestinal: Positive for nausea, vomiting, abdominal pain and diarrhea. Negative for constipation.  Endocrine: Negative for polydipsia and polyuria.  Genitourinary: Negative for dysuria, frequency, difficulty urinating and pelvic pain.  Musculoskeletal: Negative for arthralgias, back pain, neck pain and neck stiffness.  Skin: Negative for color change and wound.  Allergic/Immunologic: Negative for immunocompromised state.  Neurological: Negative for facial asymmetry, weakness, numbness and headaches.  Hematological:  Does not bruise/bleed easily.  Psychiatric/Behavioral: Negative for confusion and agitation.      Allergies  Lactose intolerance (gi)  Home Medications   Prior to Admission medications   Medication Sig Start Date End Date Taking? Authorizing Provider  aspirin-acetaminophen-caffeine (EXCEDRIN MIGRAINE) [phone] MG per tablet Take 0.25 tablets by mouth as needed for headache.   Yes Historical Provider, MD  calcium-vitamin D (OSCAL WITH D) 500-200 MG-UNIT per  tablet Take 1 tablet by mouth daily with breakfast.   Yes Historical Provider, MD  rizatriptan (MAXALT) 10 MG tablet Take 5 mg by mouth as needed. May repeat in 2 hours if needed, migraine   Yes Historical Provider, MD  HYDROcodone-acetaminophen (NORCO) 5-325 MG per tablet Take 1 tablet by mouth every 6 (six) hours as needed. 08/21/13   Dawn Patches, MD   BP 137/88  Pulse 61  Temp(Src) 98.3 F (36.8 C) (Oral)  Resp 16  SpO2 100%  LMP 10/31/2010 Physical Exam  Constitutional: She is oriented to person, place, and time. She appears well-developed and well-nourished. No distress.  HENT:  Head: Normocephalic and atraumatic.  Mouth/Throat: No oropharyngeal exudate.  Eyes: Pupils are equal, round, and reactive to light.  Neck: Normal range of motion. Neck supple.  Cardiovascular: Normal rate, regular rhythm and normal heart sounds.  Exam reveals no gallop and no friction rub.   No murmur heard. Pulmonary/Chest: Effort normal and breath sounds normal. No respiratory distress. She has no wheezes. She has no rales.  Abdominal: Soft. Bowel sounds are normal. She exhibits no distension and no mass. There is no hepatosplenomegaly. There is tenderness in the left upper quadrant. There is no rigidity, no rebound, no guarding and no CVA tenderness.  Musculoskeletal: Normal range of motion. She exhibits no edema and no tenderness.  Neurological: She is alert and oriented to person, place, and time.  Skin: Skin is warm and dry.  Psychiatric: She has a normal mood and affect.    ED Course  Procedures (including critical care time) Labs Review Labs Reviewed  CBC WITH DIFFERENTIAL - Abnormal; Notable for the following:    RDW 11.3 (*)    All other components within normal limits  COMPREHENSIVE METABOLIC PANEL - Abnormal; Notable for the following:    Total Protein 8.4 (*)    Total Bilirubin 2.1 (*)    GFR calc non Af Amer 86 (*)    All other components within normal limits  URINALYSIS, ROUTINE W  REFLEX MICROSCOPIC - Abnormal; Notable for the following:    Leukocytes, UA TRACE (*)    All other components within normal limits  URINE CULTURE  LIPASE, BLOOD  URINE MICROSCOPIC-ADD ON  I-STAT TROPOININ, ED  I-STAT CG4 LACTIC ACID, ED    Imaging Review Ct Angio Abdomen W/cm &/or Wo Contrast  08/21/2013   CLINICAL DATA:  Left upper quadrant abdominal pain. History of splenic artery aneurysm.  EXAM: CT ANGIOGRAPHY ABDOMEN  TECHNIQUE: Multidetector CT imaging of the abdomen was performed using the standard protocol during bolus administration of intravenous contrast. Multiplanar reconstructed images including MIPs were obtained and reviewed to evaluate the vascular anatomy.  CONTRAST:  163m OMNIPAQUE IOHEXOL 350 MG/ML SOLN  COMPARISON:  CT of the abdomen 08/14/2013.  FINDINGS: Lung Bases: Unremarkable.  Abdomen: Previously noted splenic artery aneurysm is unchanged in size from 7 days ago, again measuring 1.2 cm in diameter. No signs of active extravasation to suggest rupture of this aneurysm at this time.  The appearance of the liver, pancreas, spleen, bilateral adrenal  glands and right kidney is unremarkable. Sub cm low-attenuation lesion in the lower pole of the left kidney is too small to characterize, but is statistically likely a tiny cyst. Circumaortic left renal vein (normal anatomical variant), incidentally noted. Within the visualized portions of the peritoneal cavity there is no significant volume of ascites, no pneumoperitoneum and no pathologic distention of small bowel. No lymphadenopathy.  Musculoskeletal: There are no aggressive appearing lytic or blastic lesions noted in the visualized portions of the skeleton.  Review of the MIP images confirms the above findings.  IMPRESSION: 1. No acute findings in the abdomen or pelvis to account for the patient's symptoms. 2. 1.2 cm left splenic artery aneurysm is unchanged in the past 7 days.   Electronically Signed   By: Vinnie Langton M.D.   On:  08/21/2013 18:39     EKG Interpretation   Date/Time:  Thursday August 21 2013 15:59:45 EDT Ventricular Rate:  62 PR Interval:  152 QRS Duration: 99 QT Interval:  426 QTC Calculation: 433 R Axis:   58 Text Interpretation:  Sinus rhythm RSR' in V1 or V2, right VCD or RVH No  significant change since last tracing Confirmed by [place]  MD, Clinton  [phone]) on 08/21/2013 4:17:15 PM      MDM   Final diagnoses:  LUQ pain    Pt is a 43 y.o. female with Pmhx as above who presents with 3 days of worsening LUQ pain with radiation to back and L shoulder with assoc nausea, d/a.  She has had milder upper abdominal pain for several weeks, and is undergoing GI w/u which has included a CT on 7/31, which showed a splenic artery aneurysm, a nml colonoscopy, and a nml GB US. On PE, VSS, pt in NAD. She has minimal LUQ ttp w/o rebound or guarding.    5:50 PM Pt's pain somewhat improved, but still having pain. Will get CT ab/pelvis.   CT ab/pelvis unchanged. Spoke w/ Vascular who rec keeping appt, but that she will not likely need future intervention. I do not feel aneurysm is likely cause of pain. Will d/c home w/ short course of norco and instructions for close PCP f/u. Return precautions given for new or worsening symptoms including worsening pain, fever, inability to tolerate liquids.       Dawn Patches, MD 08/22/13 [phone]

## 2013-08-21 NOTE — ED Notes (Signed)
Pt c/o upper left abd pain that radiates to her back intermittently for 3 days. Pt states the pain was really bad last night and she know she has a splenic artery aneurysm and isnt able to get in to see the specialist until the 20th of this month.

## 2013-08-21 NOTE — ED Notes (Signed)
Pt tolerating PO fluids

## 2013-08-21 NOTE — ED Notes (Signed)
Pt requesting nausea medication. MD aware.

## 2013-08-21 NOTE — Discharge Instructions (Signed)
Abdominal Pain, Women °Abdominal (stomach, pelvic, or belly) pain can be caused by many things. It is important to tell your doctor: °· The location of the pain. °· Does it come and go or is it present all the time? °· Are there things that start the pain (eating certain foods, exercise)? °· Are there other symptoms associated with the pain (fever, nausea, vomiting, diarrhea)? °All of this is helpful to know when trying to find the cause of the pain. °CAUSES  °· Stomach: virus or bacteria infection, or ulcer. °· Intestine: appendicitis (inflamed appendix), regional ileitis (Crohn's disease), ulcerative colitis (inflamed colon), irritable bowel syndrome, diverticulitis (inflamed diverticulum of the colon), or cancer of the stomach or intestine. °· Gallbladder disease or stones in the gallbladder. °· Kidney disease, kidney stones, or infection. °· Pancreas infection or cancer. °· Fibromyalgia (pain disorder). °· Diseases of the female organs: °¨ Uterus: fibroid (non-cancerous) tumors or infection. °¨ Fallopian tubes: infection or tubal pregnancy. °¨ Ovary: cysts or tumors. °¨ Pelvic adhesions (scar tissue). °¨ Endometriosis (uterus lining tissue growing in the pelvis and on the pelvic organs). °¨ Pelvic congestion syndrome (female organs filling up with blood just before the menstrual period). °¨ Pain with the menstrual period. °¨ Pain with ovulation (producing an egg). °¨ Pain with an IUD (intrauterine device, birth control) in the uterus. °¨ Cancer of the female organs. °· Functional pain (pain not caused by a disease, may improve without treatment). °· Psychological pain. °· Depression. °DIAGNOSIS  °Your doctor will decide the seriousness of your pain by doing an examination. °· Blood tests. °· X-rays. °· Ultrasound. °· CT scan (computed tomography, special type of X-ray). °· MRI (magnetic resonance imaging). °· Cultures, for infection. °· Barium enema (dye inserted in the large intestine, to better view it with  X-rays). °· Colonoscopy (looking in intestine with a lighted tube). °· Laparoscopy (minor surgery, looking in abdomen with a lighted tube). °· Major abdominal exploratory surgery (looking in abdomen with a large incision). °TREATMENT  °The treatment will depend on the cause of the pain.  °· Many cases can be observed and treated at home. °· Over-the-counter medicines recommended by your caregiver. °· Prescription medicine. °· Antibiotics, for infection. °· Birth control pills, for painful periods or for ovulation pain. °· Hormone treatment, for endometriosis. °· Nerve blocking injections. °· Physical therapy. °· Antidepressants. °· Counseling with a psychologist or psychiatrist. °· Minor or major surgery. °HOME CARE INSTRUCTIONS  °· Do not take laxatives, unless directed by your caregiver. °· Take over-the-counter pain medicine only if ordered by your caregiver. Do not take aspirin because it can cause an upset stomach or bleeding. °· Try a clear liquid diet (broth or water) as ordered by your caregiver. Slowly move to a bland diet, as tolerated, if the pain is related to the stomach or intestine. °· Have a thermometer and take your temperature several times a day, and record it. °· Bed rest and sleep, if it helps the pain. °· Avoid sexual intercourse, if it causes pain. °· Avoid stressful situations. °· Keep your follow-up appointments and tests, as your caregiver orders. °· If the pain does not go away with medicine or surgery, you may try: °¨ Acupuncture. °¨ Relaxation exercises (yoga, meditation). °¨ Group therapy. °¨ Counseling. °SEEK MEDICAL CARE IF:  °· You notice certain foods cause stomach pain. °· Your home care treatment is not helping your pain. °· You need stronger pain medicine. °· You want your IUD removed. °· You feel faint or   lightheaded. °· You develop nausea and vomiting. °· You develop a rash. °· You are having side effects or an allergy to your medicine. °SEEK IMMEDIATE MEDICAL CARE IF:  °· Your  pain does not go away or gets worse. °· You have a fever. °· Your pain is felt only in portions of the abdomen. The right side could possibly be appendicitis. The left lower portion of the abdomen could be colitis or diverticulitis. °· You are passing blood in your stools (bright red or black tarry stools, with or without vomiting). °· You have blood in your urine. °· You develop chills, with or without a fever. °· You pass out. °MAKE SURE YOU:  °· Understand these instructions. °· Will watch your condition. °· Will get help right away if you are not doing well or get worse. °Document Released: 10/30/2006 Document Revised: 05/19/2013 Document Reviewed: 11/19/2008 °[employer]® Patient Information ©2015 [employer]. This information is not intended to replace advice given to you by your health care provider. Make sure you discuss any questions you have with your health care provider. ° °

## 2013-08-22 LAB — URINE CULTURE
Colony Count: NO GROWTH
Culture: NO GROWTH

## 2013-08-28 ENCOUNTER — Encounter: Payer: Self-pay | Admitting: Vascular Surgery

## 2013-09-03 ENCOUNTER — Encounter: Payer: Self-pay | Admitting: Vascular Surgery

## 2013-09-04 ENCOUNTER — Encounter: Payer: Self-pay | Admitting: Vascular Surgery

## 2013-09-04 ENCOUNTER — Ambulatory Visit (INDEPENDENT_AMBULATORY_CARE_PROVIDER_SITE_OTHER): Payer: BC Managed Care – PPO | Admitting: Vascular Surgery

## 2013-09-04 VITALS — BP 145/91 | HR 90 | Resp 16 | Ht 64.0 in | Wt 117.0 lb

## 2013-09-04 DIAGNOSIS — I728 Aneurysm of other specified arteries: Secondary | ICD-10-CM | POA: Insufficient documentation

## 2013-09-04 NOTE — Assessment & Plan Note (Signed)
This patient has a 1.2 cm splenic artery aneurysm in the hilum of the spleen. She has some left upper quadrant pain which she's had for a month and there is no clear explanation for this. Although the aneurysm was fairly small I would recommend that she be evaluated for embolization of her splenic artery aneurysm by Dr. Wallene Dales. Given her young age I think it would be best to address this rather than half the follow this with multiple CT scans over the years. We will arrange for embolization of her splenic artery aneurysm by interventional radiology. I'll be happy to see her back if any new vascular issues arise.

## 2013-09-04 NOTE — Addendum Note (Signed)
Addended by: Mena Goes on: 09/04/2013 03:43 PM   Modules accepted: Orders

## 2013-09-04 NOTE — Progress Notes (Signed)
Patient ID: Dawn Boyd, female   DOB: 20-Sep-1970, 43 y.o.   MRN: [id number]  Reason for Consult: New Evaluation   Referred by Precious Reel, MD  Subjective:     HPI:  Dawn Boyd is a 43 y.o. female who has been having some left upper quadrant pain. This has been more significant over the last month. This prompted a CT scan which showed a 1.2 cm splenic artery aneurysm near the hilum of the spleen. She was sent for vascular consultation. Her CT scan showed no evidence of rupture or edema around the aneurysm. There are no specific aggravating or alleviating factors associated with her left upper quadrant pain.  She does have a history of irritable bowel syndrome. She has no family history of aneurysmal disease that she is aware of.  Past Medical History  Diagnosis Date  . Heart murmur   . Seasonal allergies   . Headache(784.0)   . GERD (gastroesophageal reflux disease)     tums prn  . Blood transfusion     pt states age 64 in [place] had problems with blood clotting, no further problems  . PONV (postoperative nausea and vomiting)   . BRCA2 positive 09/08/2011  . Blood transfusion without reported diagnosis   . Allergy   . Osteopenia     MILD  . Clotting disorder     THROMOCYTOPENIA  AGE 10  . Depression     AFTER CHILD BIRTH  . Thrombosis of splenic artery anastomosis    Family History  Problem Relation Age of Onset  . Colon cancer Neg Hx   . Esophageal cancer Neg Hx   . Pancreatic cancer Neg Hx   . Rectal cancer Neg Hx   . Hypertension Mother   . Cancer Mother    Past Surgical History  Procedure Laterality Date  . Nasal septum repair  2007  . Laparoscopic assisted vaginal hysterectomy  11/22/2010    Procedure: LAPAROSCOPIC ASSISTED VAGINAL HYSTERECTOMY;  Surgeon: Arloa Koh;  Location: [facility];  Service: Gynecology;  Laterality: N/A;  . Salpingoophorectomy  11/22/2010    Procedure: SALPINGO OOPHERECTOMY;  Surgeon: Arloa Koh;  Location: [facility];  Service:  Gynecology;  Laterality: Bilateral;  . Abdominal hysterectomy     Short Social History:  History  Substance Use Topics  . Smoking status: Never Smoker   . Smokeless tobacco: Never Used  . Alcohol Use: 4.2 oz/week    7 Cans of beer per week   Allergies  Allergen Reactions  . Lactose Intolerance (Gi) Diarrhea, Nausea Only and Other (See Comments)    BLOATING   Current Outpatient Prescriptions  Medication Sig Dispense Refill  . aspirin-acetaminophen-caffeine (EXCEDRIN MIGRAINE) 250-250-65 MG per tablet Take 0.25 tablets by mouth as needed for headache.      . calcium-vitamin D (OSCAL WITH D) 500-200 MG-UNIT per tablet Take 1 tablet by mouth daily with breakfast.      . rizatriptan (MAXALT) 10 MG tablet Take 5 mg by mouth as needed. May repeat in 2 hours if needed, migraine      . HYDROcodone-acetaminophen (NORCO) 5-325 MG per tablet Take 1 tablet by mouth every 6 (six) hours as needed.  10 tablet  0   No current facility-administered medications for this visit.   Review of Systems  Constitutional: Positive for chills. Negative for fever.  Eyes: Negative for loss of vision.  Respiratory: Negative for cough and wheezing.  Cardiovascular: Negative for chest pain, chest tightness, claudication, dyspnea with exertion, orthopnea  and palpitations.  GI: Negative for blood in stool and vomiting.  GU: Negative for dysuria and hematuria.  Musculoskeletal: Negative for leg pain, joint pain and myalgias.  Skin: Positive for rash. Negative for wound.  Neurological: Negative for dizziness and speech difficulty.  Hematologic: Negative for bruises/bleeds easily. Psychiatric: Negative for depressed mood.       Objective:  Objective  Filed Vitals:   09/04/13 1327  BP: 145/91  Pulse: 90  Resp: 16  Height: 5' 4"  (1.626 m)  Weight: 117 lb (53.071 kg)   Body mass index is 20.07 kg/(m^2).  Physical Exam  Constitutional: She is oriented to person, place, and time. She appears well-developed  and well-nourished.  HENT:  Head: Normocephalic and atraumatic.  Neck: Neck supple. No JVD present. No thyromegaly present.  Cardiovascular: Normal rate, regular rhythm and normal heart sounds.  Exam reveals no friction rub.   No murmur heard. Pulses:      Femoral pulses are 2+ on the right side, and 2+ on the left side.      Popliteal pulses are 2+ on the right side, and 2+ on the left side.       Dorsalis pedis pulses are 2+ on the right side, and 2+ on the left side.       Posterior tibial pulses are 2+ on the right side, and 2+ on the left side.  Pulmonary/Chest: Breath sounds normal. She has no wheezes. She has no rales.  Abdominal: Soft. Bowel sounds are normal. There is no tenderness.  I do not palpate an aneurysm.  Musculoskeletal: Normal range of motion. She exhibits no edema.  Lymphadenopathy:    She has no cervical adenopathy.  Neurological: She is alert and oriented to person, place, and time. She has normal strength. No sensory deficit.  Skin: No lesion and no rash noted.  Psychiatric: She has a normal mood and affect.   Data: CT ANGIOGRAM ABD (08/21/13):  The patient is noted to have a 1.2 cm splenic artery aneurysm which had not changed in size compared to a study 7 days prior.      Assessment/Plan:     Splenic artery aneurysm This patient has a 1.2 cm splenic artery aneurysm in the hilum of the spleen. She has some left upper quadrant pain which she's had for a month and there is no clear explanation for this. Although the aneurysm was fairly small I would recommend that she be evaluated for embolization of her splenic artery aneurysm by Dr. Wallene Dales. Given her young age I think it would be best to address this rather than half the follow this with multiple CT scans over the years. We will arrange for embolization of her splenic artery aneurysm by interventional radiology. I'll be happy to see her back if any new vascular issues arise.   [name]  MD [facility]

## 2013-09-10 ENCOUNTER — Encounter: Payer: BC Managed Care – PPO | Admitting: Vascular Surgery

## 2013-09-17 ENCOUNTER — Ambulatory Visit
Admission: RE | Admit: 2013-09-17 | Discharge: 2013-09-17 | Disposition: A | Discharge status: Home / Self Care | Payer: BC Managed Care – PPO | Source: Ambulatory Visit | Attending: Vascular Surgery | Admitting: Vascular Surgery

## 2013-09-17 DIAGNOSIS — I728 Aneurysm of other specified arteries: Secondary | ICD-10-CM

## 2013-09-18 ENCOUNTER — Telehealth (HOSPITAL_COMMUNITY): Payer: Self-pay | Admitting: Interventional Radiology

## 2013-09-24 ENCOUNTER — Other Ambulatory Visit (HOSPITAL_COMMUNITY): Payer: Self-pay | Admitting: Interventional Radiology

## 2013-09-24 ENCOUNTER — Telehealth (HOSPITAL_COMMUNITY): Payer: Self-pay | Admitting: Interventional Radiology

## 2013-09-24 DIAGNOSIS — I728 Aneurysm of other specified arteries: Secondary | ICD-10-CM

## 2013-09-24 NOTE — Telephone Encounter (Signed)
Called pt on her home phone and cell phone. Left messages on both phones for her to call to schedule her splenic artery embolization with Dr. Kathlene Cote on 09/29/13. JM

## 2013-09-26 ENCOUNTER — Other Ambulatory Visit: Payer: Self-pay | Admitting: Radiology

## 2013-09-26 ENCOUNTER — Encounter (HOSPITAL_COMMUNITY): Payer: Self-pay | Admitting: *Deleted

## 2013-09-26 ENCOUNTER — Encounter (HOSPITAL_COMMUNITY): Payer: Self-pay | Admitting: Pharmacy Technician

## 2013-09-29 ENCOUNTER — Ambulatory Visit (HOSPITAL_COMMUNITY)
Admission: RE | Admit: 2013-09-29 | Discharge: 2013-09-29 | Disposition: A | Discharge status: Home / Self Care | Payer: BC Managed Care – PPO | Source: Ambulatory Visit | Attending: Interventional Radiology | Admitting: Interventional Radiology

## 2013-09-29 ENCOUNTER — Ambulatory Visit (HOSPITAL_COMMUNITY): Payer: BC Managed Care – PPO | Admitting: Certified Registered"

## 2013-09-29 ENCOUNTER — Encounter (HOSPITAL_COMMUNITY): Payer: BC Managed Care – PPO | Admitting: Certified Registered"

## 2013-09-29 ENCOUNTER — Encounter (HOSPITAL_COMMUNITY)
Admission: RE | Disposition: A | Discharge status: Home / Self Care | Payer: Self-pay | Source: Ambulatory Visit | Attending: Interventional Radiology

## 2013-09-29 VITALS — BP 127/80 | HR 71 | Temp 97.9°F | Resp 16 | Ht 63.0 in | Wt 120.0 lb

## 2013-09-29 DIAGNOSIS — Z87891 Personal history of nicotine dependence: Secondary | ICD-10-CM | POA: Insufficient documentation

## 2013-09-29 DIAGNOSIS — F329 Major depressive disorder, single episode, unspecified: Secondary | ICD-10-CM | POA: Diagnosis not present

## 2013-09-29 DIAGNOSIS — F3289 Other specified depressive episodes: Secondary | ICD-10-CM | POA: Diagnosis not present

## 2013-09-29 DIAGNOSIS — K219 Gastro-esophageal reflux disease without esophagitis: Secondary | ICD-10-CM | POA: Diagnosis not present

## 2013-09-29 DIAGNOSIS — F411 Generalized anxiety disorder: Secondary | ICD-10-CM | POA: Diagnosis not present

## 2013-09-29 DIAGNOSIS — I728 Aneurysm of other specified arteries: Secondary | ICD-10-CM | POA: Diagnosis not present

## 2013-09-29 DIAGNOSIS — I739 Peripheral vascular disease, unspecified: Secondary | ICD-10-CM | POA: Diagnosis not present

## 2013-09-29 HISTORY — DX: Anxiety disorder, unspecified: F41.9

## 2013-09-29 HISTORY — DX: Gilbert syndrome: E80.4

## 2013-09-29 HISTORY — DX: Disease of blood and blood-forming organs, unspecified: D75.9

## 2013-09-29 HISTORY — DX: Irritable bowel syndrome, unspecified: K58.9

## 2013-09-29 HISTORY — DX: Bronchitis, not specified as acute or chronic: J40

## 2013-09-29 HISTORY — PX: RADIOLOGY WITH ANESTHESIA: SHX6223

## 2013-09-29 LAB — CBC WITH DIFFERENTIAL/PLATELET
BASOS ABS: 0 10*3/uL (ref 0.0–0.1)
Basophils Relative: 0 % (ref 0–1)
EOS ABS: 0.1 10*3/uL (ref 0.0–0.7)
Eosinophils Relative: 1 % (ref 0–5)
HCT: 35.2 % — ABNORMAL LOW (ref 36.0–46.0)
HEMOGLOBIN: 12.6 g/dL (ref 12.0–15.0)
Lymphocytes Relative: 41 % (ref 12–46)
Lymphs Abs: 1.5 10*3/uL (ref 0.7–4.0)
MCH: 29.9 pg (ref 26.0–34.0)
MCHC: 35.8 g/dL (ref 30.0–36.0)
MCV: 83.6 fL (ref 78.0–100.0)
MONOS PCT: 6 % (ref 3–12)
Monocytes Absolute: 0.2 10*3/uL (ref 0.1–1.0)
NEUTROS ABS: 1.9 10*3/uL (ref 1.7–7.7)
NEUTROS PCT: 52 % (ref 43–77)
PLATELETS: 187 10*3/uL (ref 150–400)
RBC: 4.21 MIL/uL (ref 3.87–5.11)
RDW: 11.6 % (ref 11.5–15.5)
WBC: 3.7 10*3/uL — ABNORMAL LOW (ref 4.0–10.5)

## 2013-09-29 LAB — PROTIME-INR
INR: 1.14 (ref 0.00–1.49)
PROTHROMBIN TIME: 14.6 s (ref 11.6–15.2)

## 2013-09-29 LAB — BASIC METABOLIC PANEL
Anion gap: 12 (ref 5–15)
BUN: 13 mg/dL (ref 6–23)
CO2: 25 mEq/L (ref 19–32)
CREATININE: 0.84 mg/dL (ref 0.50–1.10)
Calcium: 9.5 mg/dL (ref 8.4–10.5)
Chloride: 105 mEq/L (ref 96–112)
GFR, EST NON AFRICAN AMERICAN: 84 mL/min — AB (ref >90)
Glucose, Bld: 103 mg/dL — ABNORMAL HIGH (ref 70–99)
Potassium: 3.9 mEq/L (ref 3.7–5.3)
Sodium: 142 mEq/L (ref 137–147)

## 2013-09-29 LAB — APTT: aPTT: 32 seconds (ref 24–37)

## 2013-09-29 SURGERY — RADIOLOGY WITH ANESTHESIA
Anesthesia: General

## 2013-09-29 MED ORDER — CEFAZOLIN SODIUM-DEXTROSE 2-3 GM-% IV SOLR
INTRAVENOUS | Status: AC
  Administered 2013-09-29: 2 g via INTRAVENOUS
  Filled 2013-09-29: qty 50

## 2013-09-29 MED ORDER — NEOSTIGMINE METHYLSULFATE 10 MG/10ML IV SOLN
INTRAVENOUS | Status: DC | PRN
  Administered 2013-09-29: 4 mg via INTRAVENOUS

## 2013-09-29 MED ORDER — EPHEDRINE SULFATE 50 MG/ML IJ SOLN
INTRAMUSCULAR | Status: DC | PRN
  Administered 2013-09-29 (×3): 5 mg via INTRAVENOUS
  Administered 2013-09-29: 10 mg via INTRAVENOUS

## 2013-09-29 MED ORDER — NITROGLYCERIN 1 MG/10 ML FOR IR/CATH LAB
INTRA_ARTERIAL | Status: AC
  Administered 2013-09-29: 200 ug via INTRA_ARTERIAL
  Filled 2013-09-29: qty 10

## 2013-09-29 MED ORDER — FENTANYL CITRATE 0.05 MG/ML IJ SOLN
INTRAMUSCULAR | Status: DC | PRN
  Administered 2013-09-29: 100 ug via INTRAVENOUS

## 2013-09-29 MED ORDER — DEXAMETHASONE SODIUM PHOSPHATE 10 MG/ML IJ SOLN
INTRAMUSCULAR | Status: DC | PRN
  Administered 2013-09-29: 4 mg via INTRAVENOUS

## 2013-09-29 MED ORDER — GLYCOPYRROLATE 0.2 MG/ML IJ SOLN
INTRAMUSCULAR | Status: DC | PRN
  Administered 2013-09-29: .7 mg via INTRAVENOUS

## 2013-09-29 MED ORDER — PHENYLEPHRINE HCL 10 MG/ML IJ SOLN
INTRAMUSCULAR | Status: DC | PRN
  Administered 2013-09-29 (×2): 40 ug via INTRAVENOUS

## 2013-09-29 MED ORDER — LACTATED RINGERS IV SOLN
INTRAVENOUS | Status: DC
  Administered 2013-09-29: 08:00:00 via INTRAVENOUS

## 2013-09-29 MED ORDER — ONDANSETRON HCL 4 MG/2ML IJ SOLN
INTRAMUSCULAR | Status: DC | PRN
  Administered 2013-09-29: 4 mg via INTRAVENOUS

## 2013-09-29 MED ORDER — HYDROMORPHONE HCL PF 1 MG/ML IJ SOLN: 0.2500 mg | INTRAMUSCULAR | Status: DC | PRN

## 2013-09-29 MED ORDER — MIDAZOLAM HCL 5 MG/5ML IJ SOLN
INTRAMUSCULAR | Status: DC | PRN
  Administered 2013-09-29: 2 mg via INTRAVENOUS

## 2013-09-29 MED ORDER — NITROGLYCERIN 1 MG/10 ML FOR IR/CATH LAB
INTRA_ARTERIAL | Status: AC
  Filled 2013-09-29: qty 10

## 2013-09-29 MED ORDER — PROMETHAZINE HCL 25 MG/ML IJ SOLN
INTRAMUSCULAR | Status: AC
  Filled 2013-09-29: qty 1

## 2013-09-29 MED ORDER — PROMETHAZINE HCL 25 MG/ML IJ SOLN
6.2500 mg | INTRAMUSCULAR | Status: DC | PRN
  Administered 2013-09-29: 6.25 mg via INTRAVENOUS

## 2013-09-29 MED ORDER — HEPARIN SODIUM (PORCINE) 1000 UNIT/ML IJ SOLN
INTRAMUSCULAR | Status: DC | PRN
  Administered 2013-09-29: 2000 [IU] via INTRAVENOUS
  Administered 2013-09-29: 1000 [IU] via INTRAVENOUS

## 2013-09-29 MED ORDER — OXYCODONE HCL 5 MG/5ML PO SOLN: 5.0000 mg | Freq: Once | ORAL | Status: DC | PRN

## 2013-09-29 MED ORDER — PROPOFOL 10 MG/ML IV BOLUS
INTRAVENOUS | Status: DC | PRN
  Administered 2013-09-29: 200 mg via INTRAVENOUS

## 2013-09-29 MED ORDER — NITROGLYCERIN 1 MG/10 ML FOR IR/CATH LAB
200.0000 ug | INTRA_ARTERIAL | Status: AC
  Administered 2013-09-29: 200 ug via INTRA_ARTERIAL

## 2013-09-29 MED ORDER — LIDOCAINE HCL 4 % MT SOLN
OROMUCOSAL | Status: DC | PRN
  Administered 2013-09-29: 5 mL via TOPICAL

## 2013-09-29 MED ORDER — OXYCODONE HCL 5 MG PO TABS: 5.0000 mg | ORAL_TABLET | Freq: Once | ORAL | Status: DC | PRN

## 2013-09-29 MED ORDER — LACTATED RINGERS IV SOLN
INTRAVENOUS | Status: DC | PRN
  Administered 2013-09-29 (×2): via INTRAVENOUS

## 2013-09-29 MED ORDER — SODIUM CHLORIDE 0.9 % IV SOLN: INTRAVENOUS | Status: DC

## 2013-09-29 MED ORDER — ROCURONIUM BROMIDE 100 MG/10ML IV SOLN
INTRAVENOUS | Status: DC | PRN
  Administered 2013-09-29: 40 mg via INTRAVENOUS

## 2013-09-29 MED ORDER — IOHEXOL 300 MG/ML  SOLN
125.0000 mL | Freq: Once | INTRAMUSCULAR | Status: AC | PRN
  Administered 2013-09-29: 80 mL via INTRAVENOUS

## 2013-09-29 NOTE — H&P (Signed)
Patient seen.  Consent obtained.  For embolization of splenic artery aneurysm today.  General anesthesia requested due to potential difficulty and length of procedure.

## 2013-09-29 NOTE — Discharge Instructions (Signed)

## 2013-09-29 NOTE — Sedation Documentation (Signed)
Report given to PACU RN. 2+ pulses; post-tib and pedal. Right groin without hematoma or bleeding at this time.

## 2013-09-29 NOTE — Procedures (Signed)
Procedure:  Celiac arteriography; splenic arteriography Anesthesia:  General Findings:  Focal aneurysm near splenic hilum.  Smaller mid splenic aneurysm. Due to extreme tortuosity, unable to advance microcatheters to level of aneurysm to allow coiling. Right CFA groin closed with Cordis ExoSeal. Plan:  PACU recovery.  Total 4-6 hour recovery then likely discharge to home tonight.

## 2013-09-29 NOTE — Progress Notes (Signed)
Patient ID: Dawn Boyd, female   DOB: Feb 20, 1970, 43 y.o.   MRN: [id number]   Referring Physician(s): Dyke Maes MD  Subjective: Attempted splenic artery aneurysm embolization Unable to perform secondary tortuosity of vessel Aborted after many attempts Awaken from anesthesia without issue Resting in [place] No complaints   Allergies: Lactose intolerance (gi)  Medications: Prior to Admission medications   Medication Sig Start Date End Date Taking? Authorizing Provider  aspirin-acetaminophen-caffeine (EXCEDRIN MIGRAINE) [phone] MG per tablet Take 0.5 tablets by mouth every 6 (six) hours as needed for headache.     Historical Provider, MD  rizatriptan (MAXALT) 10 MG tablet Take 5 mg by mouth as needed. May repeat in 2 hours if needed, migraine    Historical Provider, MD    Review of Systems  Respiratory: Negative for cough, chest tightness and shortness of breath.   Cardiovascular: Negative for chest pain.  Gastrointestinal: Negative for abdominal distention.  Genitourinary: Negative for pelvic pain.  Musculoskeletal: Negative for back pain.  Neurological: Negative for dizziness and headaches.    Vital Signs: BP 90/67  Pulse 73  Temp(Src) 97.9 F (36.6 C) (Oral)  Resp 16  Ht 5' 3"  (1.6 m)  Wt 54.432 kg (120 lb)  BMI 21.26 kg/m2  SpO2 100%  LMP 10/31/2010  Physical Exam  Abdominal: Soft.  Musculoskeletal:  Rt groin NT; no bleeding No hematoma  Skin: Skin is warm and dry.  Psychiatric: She has a normal mood and affect. Her behavior is normal. Judgment and thought content normal.    Imaging: Ir Angiogram Visceral Selective  09/29/2013   CLINICAL DATA:  12 mm distal splenic artery aneurysm with left upper quadrant pain. The patient has been previously evaluated and presents for arteriography and attempted embolization of the aneurysm.  EXAM: 1. ULTRASOUND GUIDANCE FOR VASCULAR ACCESS OF THE RIGHT COMMON FEMORAL ARTERY 2. SELECTIVE VISCERAL  ARTERIOGRAPHY OF THE CELIAC AXIS 3. ADDITIONAL SELECTIVE ARTERIOGRAPHY OF THE SPLENIC ARTERY  MEDICATIONS: 2 g IV Ancef. As antibiotic prophylaxis, Ancef was ordered pre-procedure and administered intravenously within one hour of incision.  Anesthesia:  General  CONTRAST:  67m OMNIPAQUE IOHEXOL 300 MG/ML SOLN  FLUOROSCOPY TIME:  50 minutes.  PROCEDURE: Prior to the procedure, detailed informed consent was obtained from the patient for arteriography with possible embolization. Risks and benefits of the procedure were discussed in detail at the time of previous consultation.  The right groin was sterilely prepped with Betadine and draped. Maximal barrier sterile technique was utilized including caps, mask, sterile gowns, sterile gloves, sterile drape, hand hygiene and skin antiseptic.  Ultrasound was used to confirm patency of the right common femoral artery. Under ultrasound guidance, access of the artery was performed with a micropuncture set. Ultrasound image documentation was performed. After guidewire access, a 5 French sheath was placed over a guidewire. Attempts were made to advance a 5 FPakistancobra catheter into the celiac axis. Selective arteriography of the celiac axis was performed via a 5 FP[place]Sos catheter.  A Renegade high flow micro catheter was advanced through the 5 French catheter and into the splenic artery. Arteriography was performed through the micro catheter at 2 different locations within the main splenic artery.  Micro catheter and 5 French catheter were then removed and a 6 French angled sheath advanced into the abdominal aorta. The celiac axis was recatheterized and both high flow and smaller caliber micro catheters were advanced over various micro guidewires in the splenic artery.  Oblique imaging of the common femoral artery  access was performed after injection of contrast through a sheath. Arteriotomy closure was performed with the Cordis ExoSeal device.  FINDINGS: Celiac arteriography  demonstrates an extremely tortuous splenic artery. The focal 12 mm aneurysm emanating from a branch point of a superior splenic artery branch was well visualized and completely opacifies with contrast during arteriography. The second smaller aneurysm emanating from the distal aspect of the main splenic artery is visible. Small short gastric branches are visualized. The visualized common hepatic and left gastric arteries are unremarkable.  A micro catheter was able to be advanced nearly to the splenic hilum but could not be advanced all the way to the level of the superior splenic aneurysm due to the significant tortuosity present. Despite multiple maneuvers utilizing different catheters, guidewires and a longer sheath, catheter access could not be established to allow coil embolization of the aneurysm.  IMPRESSION: Focal branch vessel splenic artery aneurysm in the superior splenic hilum. The more subtle smaller distal splenic artery aneurysm is more difficult to visualize by arteriography, but was identifiable. Due to extreme tortuosity of the splenic artery, the distal dominant splenic artery aneurysm could not be catheterized to allow coil embolization.   Electronically Signed   By: Aletta Edouard M.D.   On: 09/29/2013 15:17   Ir Angiogram Selective Each Additional Vessel  09/29/2013   CLINICAL DATA:  12 mm distal splenic artery aneurysm with left upper quadrant pain. The patient has been previously evaluated and presents for arteriography and attempted embolization of the aneurysm.  EXAM: 1. ULTRASOUND GUIDANCE FOR VASCULAR ACCESS OF THE RIGHT COMMON FEMORAL ARTERY 2. SELECTIVE VISCERAL ARTERIOGRAPHY OF THE CELIAC AXIS 3. ADDITIONAL SELECTIVE ARTERIOGRAPHY OF THE SPLENIC ARTERY  MEDICATIONS: 2 g IV Ancef. As antibiotic prophylaxis, Ancef was ordered pre-procedure and administered intravenously within one hour of incision.  Anesthesia:  General  CONTRAST:  64m OMNIPAQUE IOHEXOL 300 MG/ML SOLN  FLUOROSCOPY  TIME:  50 minutes.  PROCEDURE: Prior to the procedure, detailed informed consent was obtained from the patient for arteriography with possible embolization. Risks and benefits of the procedure were discussed in detail at the time of previous consultation.  The right groin was sterilely prepped with Betadine and draped. Maximal barrier sterile technique was utilized including caps, mask, sterile gowns, sterile gloves, sterile drape, hand hygiene and skin antiseptic.  Ultrasound was used to confirm patency of the right common femoral artery. Under ultrasound guidance, access of the artery was performed with a micropuncture set. Ultrasound image documentation was performed. After guidewire access, a 5 French sheath was placed over a guidewire. Attempts were made to advance a 5 FPakistancobra catheter into the celiac axis. Selective arteriography of the celiac axis was performed via a 5 FP[place]Sos catheter.  A Renegade high flow micro catheter was advanced through the 5 French catheter and into the splenic artery. Arteriography was performed through the micro catheter at 2 different locations within the main splenic artery.  Micro catheter and 5 French catheter were then removed and a 6 French angled sheath advanced into the abdominal aorta. The celiac axis was recatheterized and both high flow and smaller caliber micro catheters were advanced over various micro guidewires in the splenic artery.  Oblique imaging of the common femoral artery access was performed after injection of contrast through a sheath. Arteriotomy closure was performed with the Cordis ExoSeal device.  FINDINGS: Celiac arteriography demonstrates an extremely tortuous splenic artery. The focal 12 mm aneurysm emanating from a branch point of a superior splenic artery branch  was well visualized and completely opacifies with contrast during arteriography. The second smaller aneurysm emanating from the distal aspect of the main splenic artery is visible.  Small short gastric branches are visualized. The visualized common hepatic and left gastric arteries are unremarkable.  A micro catheter was able to be advanced nearly to the splenic hilum but could not be advanced all the way to the level of the superior splenic aneurysm due to the significant tortuosity present. Despite multiple maneuvers utilizing different catheters, guidewires and a longer sheath, catheter access could not be established to allow coil embolization of the aneurysm.  IMPRESSION: Focal branch vessel splenic artery aneurysm in the superior splenic hilum. The more subtle smaller distal splenic artery aneurysm is more difficult to visualize by arteriography, but was identifiable. Due to extreme tortuosity of the splenic artery, the distal dominant splenic artery aneurysm could not be catheterized to allow coil embolization.   Electronically Signed   By: Aletta Edouard M.D.   On: 09/29/2013 15:17   Ir US Guide Vasc Access Right  09/29/2013   CLINICAL DATA:  12 mm distal splenic artery aneurysm with left upper quadrant pain. The patient has been previously evaluated and presents for arteriography and attempted embolization of the aneurysm.  EXAM: 1. ULTRASOUND GUIDANCE FOR VASCULAR ACCESS OF THE RIGHT COMMON FEMORAL ARTERY 2. SELECTIVE VISCERAL ARTERIOGRAPHY OF THE CELIAC AXIS 3. ADDITIONAL SELECTIVE ARTERIOGRAPHY OF THE SPLENIC ARTERY  MEDICATIONS: 2 g IV Ancef. As antibiotic prophylaxis, Ancef was ordered pre-procedure and administered intravenously within one hour of incision.  Anesthesia:  General  CONTRAST:  75m OMNIPAQUE IOHEXOL 300 MG/ML SOLN  FLUOROSCOPY TIME:  50 minutes.  PROCEDURE: Prior to the procedure, detailed informed consent was obtained from the patient for arteriography with possible embolization. Risks and benefits of the procedure were discussed in detail at the time of previous consultation.  The right groin was sterilely prepped with Betadine and draped. Maximal barrier  sterile technique was utilized including caps, mask, sterile gowns, sterile gloves, sterile drape, hand hygiene and skin antiseptic.  Ultrasound was used to confirm patency of the right common femoral artery. Under ultrasound guidance, access of the artery was performed with a micropuncture set. Ultrasound image documentation was performed. After guidewire access, a 5 French sheath was placed over a guidewire. Attempts were made to advance a 5 FPakistancobra catheter into the celiac axis. Selective arteriography of the celiac axis was performed via a 5 [place]Sos catheter.  A Renegade high flow micro catheter was advanced through the 5 French catheter and into the splenic artery. Arteriography was performed through the micro catheter at 2 different locations within the main splenic artery.  Micro catheter and 5 French catheter were then removed and a 6 French angled sheath advanced into the abdominal aorta. The celiac axis was recatheterized and both high flow and smaller caliber micro catheters were advanced over various micro guidewires in the splenic artery.  Oblique imaging of the common femoral artery access was performed after injection of contrast through a sheath. Arteriotomy closure was performed with the Cordis ExoSeal device.  FINDINGS: Celiac arteriography demonstrates an extremely tortuous splenic artery. The focal 12 mm aneurysm emanating from a branch point of a superior splenic artery branch was well visualized and completely opacifies with contrast during arteriography. The second smaller aneurysm emanating from the distal aspect of the main splenic artery is visible. Small short gastric branches are visualized. The visualized common hepatic and left gastric arteries are unremarkable.  A micro  catheter was able to be advanced nearly to the splenic hilum but could not be advanced all the way to the level of the superior splenic aneurysm due to the significant tortuosity present. Despite multiple  maneuvers utilizing different catheters, guidewires and a longer sheath, catheter access could not be established to allow coil embolization of the aneurysm.  IMPRESSION: Focal branch vessel splenic artery aneurysm in the superior splenic hilum. The more subtle smaller distal splenic artery aneurysm is more difficult to visualize by arteriography, but was identifiable. Due to extreme tortuosity of the splenic artery, the distal dominant splenic artery aneurysm could not be catheterized to allow coil embolization.   Electronically Signed   By: Aletta Edouard M.D.   On: 09/29/2013 15:17    Labs: Results for orders placed during the hospital encounter of 09/29/13 (from the past 48 hour(s))  APTT     Status: None   Collection Time    09/29/13  7:35 AM      Result Value Ref Range   aPTT 32  24 - 37 seconds  BASIC METABOLIC PANEL     Status: Abnormal   Collection Time    09/29/13  7:35 AM      Result Value Ref Range   Sodium 142  137 - 147 mEq/L   Potassium 3.9  3.7 - 5.3 mEq/L   Chloride 105  96 - 112 mEq/L   CO2 25  19 - 32 mEq/L   Glucose, Bld 103 (*) 70 - 99 mg/dL   BUN 13  6 - 23 mg/dL   Creatinine, Ser 0.84  0.50 - 1.10 mg/dL   Calcium 9.5  8.4 - 10.5 mg/dL   GFR calc non Af Amer 84 (*) >90 mL/min   GFR calc Af Amer >90  >90 mL/min   Comment: (NOTE)     The eGFR has been calculated using the CKD EPI equation.     This calculation has not been validated in all clinical situations.     eGFR's persistently <90 mL/min signify possible Chronic Kidney     Disease.   Anion gap 12  5 - 15  CBC WITH DIFFERENTIAL     Status: Abnormal   Collection Time    09/29/13  7:35 AM      Result Value Ref Range   WBC 3.7 (*) 4.0 - 10.5 K/uL   RBC 4.21  3.87 - 5.11 MIL/uL   Hemoglobin 12.6  12.0 - 15.0 g/dL   HCT 35.2 (*) 36.0 - 46.0 %   MCV 83.6  78.0 - 100.0 fL   MCH 29.9  26.0 - 34.0 pg   MCHC 35.8  30.0 - 36.0 g/dL   RDW 11.6  11.5 - 15.5 %   Platelets 187  150 - 400 K/uL   Neutrophils  Relative % 52  43 - 77 %   Neutro Abs 1.9  1.7 - 7.7 K/uL   Lymphocytes Relative 41  12 - 46 %   Lymphs Abs 1.5  0.7 - 4.0 K/uL   Monocytes Relative 6  3 - 12 %   Monocytes Absolute 0.2  0.1 - 1.0 K/uL   Eosinophils Relative 1  0 - 5 %   Eosinophils Absolute 0.1  0.0 - 0.7 K/uL   Basophils Relative 0  0 - 1 %   Basophils Absolute 0.0  0.0 - 0.1 K/uL  PROTIME-INR     Status: None   Collection Time    09/29/13  7:35 AM  Result Value Ref Range   Prothrombin Time 14.6  11.6 - 15.2 seconds   INR 1.14  0.00 - 1.49    Assessment and Plan:  Unsuccessful splenic artery aneurysm embolization Vessel too tortuous- unable to cannulate aneurysm Dr Kathlene Cote has seen and examined pt Has discussed findings at length with pt She has good understanding Rec: follow up with Dr Scot Dock for next plan   Lavonia Drafts PAC-IR   I spent a total of 20 minutes face to face in clinical consultation/evaluation, greater than 50% of which was counseling/coordinating care for unsuccessful splenic artery aneurysm embolization.

## 2013-09-29 NOTE — Anesthesia Preprocedure Evaluation (Signed)
Anesthesia Evaluation  Patient identified by MRN, date of birth, ID band Patient awake    Reviewed: Allergy & Precautions, H&P , NPO status , Patient's Chart, lab work & pertinent test results  History of Anesthesia Complications (+) PONV and history of anesthetic complications  Airway Mallampati: II TM Distance: >3 FB Neck ROM: Full    Dental  (+) Teeth Intact, Dental Advisory Given   Pulmonary former smoker,    Pulmonary exam normal       Cardiovascular + Peripheral Vascular Disease     Neuro/Psych  Headaches, PSYCHIATRIC DISORDERS Anxiety Depression    GI/Hepatic Neg liver ROS, GERD-  ,  Endo/Other  negative endocrine ROS  Renal/GU negative Renal ROS     Musculoskeletal   Abdominal   Peds  Hematology   Anesthesia Other Findings   Reproductive/Obstetrics                           Anesthesia Physical Anesthesia Plan  ASA: II  Anesthesia Plan: General   Post-op Pain Management:    Induction: Intravenous  Airway Management Planned: Oral ETT  Additional Equipment:   Intra-op Plan:   Post-operative Plan: Extubation in OR  Informed Consent: I have reviewed the patients History and Physical, chart, labs and discussed the procedure including the risks, benefits and alternatives for the proposed anesthesia with the patient or authorized representative who has indicated his/her understanding and acceptance.   Dental advisory given  Plan Discussed with: CRNA, Anesthesiologist and Surgeon  Anesthesia Plan Comments:         Anesthesia Quick Evaluation

## 2013-09-29 NOTE — Transfer of Care (Signed)
Immediate Anesthesia Transfer of Care Note  Patient: Dawn Boyd  Procedure(s) Performed: Procedure(s): EMBOLIZATION  (N/A)  Patient Location: PACU  Anesthesia Type:General  Level of Consciousness: awake, alert  and oriented  Airway & Oxygen Therapy: Patient Spontanous Breathing and Patient connected to nasal cannula oxygen  Post-op Assessment: Report given to PACU RN, Post -op Vital signs reviewed and stable and Patient moving all extremities X 4  Post vital signs: Reviewed and stable  Complications: No apparent anesthesia complications

## 2013-09-29 NOTE — Anesthesia Postprocedure Evaluation (Signed)
Anesthesia Post Note  Patient: Dawn Boyd  Procedure(s) Performed: Procedure(s) (LRB): EMBOLIZATION  (N/A)  Anesthesia type: general  Patient location: PACU  Post pain: Pain level controlled  Post assessment: Patient's Cardiovascular Status Stable  Last Vitals:  Filed Vitals:   09/29/13 1459  BP: 123/75  Pulse: 67  Temp: 36.8 C  Resp:     Post vital signs: Reviewed and stable  Level of consciousness: sedated  Complications: No apparent anesthesia complications

## 2013-09-29 NOTE — H&P (Signed)
Chief Complaint: Splenic artery aneurysm L upper quadrant pain  Referring Physician(s): Yamagata,Glenn T Dr Gae Gallop  History of Present Illness: Dawn Boyd is a 43 y.o. female  Pt has had LUQ pain off and on x months Also hx BRAC2 mutation - hysterectomy/B oophorectomy 2012 CT Abd was performed 08/14/13 to screen for cancer and evaluate pain Noted was splenic artery aneurysm close to spleen and second small aneurysm further out artery. Possible that aneurysm close to spleen may be causing pain Evaluation and consult with Dr Kathlene Cote was performed 09/17/13 Discussion of splenic artery aneurysm embolization  Pt now scheduled for same  Past Medical History  Diagnosis Date  . Heart murmur   . Seasonal allergies   . Headache(784.0)   . GERD (gastroesophageal reflux disease)     tums prn  . Blood transfusion     pt states age 43 in [place] had problems with blood clotting, no further problems  . PONV (postoperative nausea and vomiting)   . BRCA2 positive 09/08/2011  . Blood transfusion without reported diagnosis   . Allergy   . Osteopenia     MILD  . Clotting disorder     THROMOCYTOPENIA  AGE 30  . Depression     AFTER CHILD BIRTH  . Thrombosis of splenic artery anastomosis   . Blood dyscrasia     throbocytopenia  . IBS (irritable bowel syndrome)   . Gilbert's syndrome   . Bronchitis   . Anxiety     situational    Past Surgical History  Procedure Laterality Date  . Nasal septum repair  2007  . Laparoscopic assisted vaginal hysterectomy  11/22/2010    Procedure: LAPAROSCOPIC ASSISTED VAGINAL HYSTERECTOMY;  Surgeon: Arloa Koh;  Location: [facility];  Service: Gynecology;  Laterality: N/A;  . Salpingoophorectomy  11/22/2010    Procedure: SALPINGO OOPHERECTOMY;  Surgeon: Arloa Koh;  Location: [facility];  Service: Gynecology;  Laterality: Bilateral;  . Abdominal hysterectomy      Allergies: Lactose intolerance (gi)  Medications: Prior to Admission  medications   Medication Sig Start Date End Date Taking? Authorizing Provider  aspirin-acetaminophen-caffeine (EXCEDRIN MIGRAINE) [phone] MG per tablet Take 0.5 tablets by mouth every 6 (six) hours as needed for headache.     Historical Provider, MD  rizatriptan (MAXALT) 10 MG tablet Take 5 mg by mouth as needed. May repeat in 2 hours if needed, migraine    Historical Provider, MD    Family History  Problem Relation Age of Onset  . Colon cancer Neg Hx   . Esophageal cancer Neg Hx   . Pancreatic cancer Neg Hx   . Rectal cancer Neg Hx   . Hypertension Mother   . Cancer Mother     History   Social History  . Marital Status: Married    Spouse Name: N/A    Number of Children: N/A  . Years of Education: N/A   Social History Main Topics  . Smoking status: Former Smoker    Types: Cigars  . Smokeless tobacco: Never Used     Comment: During college smoked cigars  . Alcohol Use: 4.2 oz/week    7 Cans of beer per week     Comment: social beer/ mixed drink  . Drug Use: No  . Sexual Activity: Not on file   Other Topics Concern  . Not on file   Social History Narrative  . No narrative on file      Review of Systems: A 12 point  ROS discussed and pertinent positives are indicated in the HPI above.  All other systems are negative.  Review of Systems  Constitutional: Negative for fever, appetite change and unexpected weight change.  Respiratory: Negative for cough, chest tightness and shortness of breath.   Cardiovascular: Negative for chest pain.  Gastrointestinal: Positive for abdominal pain. Negative for nausea and vomiting.  Genitourinary: Negative for difficulty urinating.  Musculoskeletal: Negative for back pain and gait problem.  Skin: Negative for color change.  Psychiatric/Behavioral: Negative for confusion. The patient is nervous/anxious.     Vital Signs: BP 151/105  Pulse 72  Temp(Src) 98.7 F (37.1 C) (Oral)  Resp 16  Ht _0  (1.6 m)  Wt 54.432 kg (120 lb)   BMI 21.26 kg/m2  SpO2 100%  LMP 10/31/2010  Physical Exam  Constitutional: She is oriented to person, place, and time. She appears well-developed and well-nourished.  Cardiovascular: Normal rate and regular rhythm.   No murmur heard. Pulmonary/Chest: Effort normal and breath sounds normal. No respiratory distress. She has no wheezes.  Abdominal: Soft. Bowel sounds are normal. She exhibits no distension. There is no tenderness.  Musculoskeletal: Normal range of motion. She exhibits no tenderness.  Neurological: She is alert and oriented to person, place, and time.  Skin: Skin is warm and dry.  Psychiatric: She has a normal mood and affect. Her behavior is normal. Judgment and thought content normal.    Imaging: Ir Radiologist Eval & Mgmt  09/18/2013   : September 17, 2013  Gae Gallop, M.D.  [facility] [address], [place]  [id number]  RE:  Dawn Boyd (DOB: 10/19/1970)  Dear Gerald Stabs:  Thank you for referring Dawn Boyd for consultation regarding possible transcatheter embolization of a splenic artery aneurysm. The patient is a 43 year old female with a history of BRCA2 mutation who had a CT of the abdomen performed on 08/14/2013 due to left upper quadrant pain for cancer screening. This showed the presence of a 12 mm distal splenic artery aneurysm. Follow-up CTA was performed on 08/21/2013 demonstrating a noncalcified 12 mm aneurysm of a splenic artery branch located in the superior hilum of the spleen. There was no evidence of aneurysm rupture by CT. The spleen demonstrates normal perfusion without evidence of infarction and is of normal size.  The patient describes left upper quadrant discomfort over the last seven months which she describes as "pressure" that occurs with rotation of her torso and occasionally radiates to her left shoulder. She initially began experiencing this discomfort while playing tennis. She continues to experience some episodes of  pain with rotational movement. There is no constant component of pain.  Past Medical History:  1. History of BRCA2 mutation as above. The patient is status post complete hysterectomy with bilateral oophorectomy in November 2012. Her mother had a history of ovarian carcinoma. She was evaluated in the past by Dr. Marcy Panning and elected to not undergo prophylactic bilateral mastectomy. 2. History of migraine headaches. 3. History of heart murmur. 4. History of gastroesophageal reflux disease.  Medication: Maxalt 10 mg as needed for migraine headaches. She uses the medication approximately once a week. Excedrin approximately 2-3 times a week.  Allergies:  No known drug allergies.  Social History: The patient is married and has two children. 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 She consumes social alcohol. She is a non-smoker.  Family History: Mother with history of ovarian carcinoma. No family history of aneurysms.  Review of Systems:  General: 5'3" in height, 120 lbs in weight.  No fever or chills.  Ear, nose, throat:  History of earaches and sinus infections.  Gastrointestinal:  No nausea, vomiting or diarrhea.  Genitourinary: Some increased urinary frequency. No hematuria or dysuria.  Cardiovascular:  History of benign heart murmur.  Musculoskeletal:  Occasional neck pain.  Neurologic:  No focal neurologic symptoms.  Respiratory:  No cough, shortness of breath or hemoptysis.  Physical Exam:  Vital Signs: Blood pressure 125/87, pulse 65, temperature 97.7, respirations 14, oxygen saturation 99 percent on room air.  General:  No distress. Chest:  Clear to auscultation bilaterally.  Heart:  Regular rate and rhythm.  No audible murmurs.  Abdomen:  Soft and nontender.  No distention. No flank tenderness.  Extremities:  No edema.  Labs: WBC 4.7, hemoglobin 14.3, hematocrit 40.1, platelet count 221, BUN 15, creatinine 0.82, sodium 142, potassium 3.9, total bilirubin 2.1.  Imaging: I reviewed the recent CT  studies. The distal splenic artery aneurysm measures approximately 8 x 10 x 12 mm and originates off of a superior distal splenic artery branch. There are two smaller branches emanating from the base of the aneurysm that supplies the superior portion of the spleen. The normal artery immediately proximal to the aneurysm measures 2.3 mm in diameter. The main splenic artery is very tortuous and measures approximately 5 mm in diameter. On review, there is a second smaller splenic artery aneurysm which was not initially detected. This emanates from the mid portion of the main splenic artery just prior to origination of short gastric arteries and measures approximately 5 x 8 x 6 mm. The celiac axis shows normal anatomy and patency. The abdominal aorta, mesenteric vessels and renal arteries are normal. No evidence of splenic artery dissection.  Assessment: I met with Dawn Boyd and reviewed imaging findings with her. Although the dominant distal splenic artery aneurysm is not very large in size, it does originate off of a 2 mm vessel and it is conceivable that at 12 mm, this is now causing the episodic left upper quadrant pain the patient is experiencing especially with rotational movement. The smaller incidental mid splenic artery aneurysm likely is not contributing to symptoms. Details of transcatheter embolization were discussed with the patient including technical details and risks. Although the splenic artery is quite tortuous, the tortuous segments should be able to be negotiated via a microcatheter allowing primary coil embolization of the splenic artery aneurysm. Embolization of the dominant aneurysm would only affect a small portion of the splenic parenchyma distal to the aneurysm and should result in a fairly small splenic infarct. Options were discussed including continued observation to determine aneurysm growth versus proceeding with angiography and possible embolization.  Given her symptoms, there may be a  stronger indication to treat the aneurysm sooner. The patient is anxious about the risk of aneurysm rupture and would like to have the aneurysm treated. I believe the small incidental mid splenic artery aneurysm can be watched to evaluate for growth and would only be treated if it does enlarge over time. We did discuss performing the procedure under both moderate conscious sedation as well as general anesthesia.  Given the significant tortuosity of the splenic artery and distal location of the dominant aneurysm, it may be advantageous to perform the procedure under general anesthesia. This would also allow periodic suspension of respiration to better visualize the aneurysm and branch vessels. At this location, there will be significant diaphragmatic motion with respiration. We also  discussed overnight observation after embolization to treat any post embolic symptoms given that treating the aneurysm will likely result in a small splenic infarct. I do not think that there is any need for Pneumovax vaccination as the majority of the splenic parenchyma will remain perfused after embolization.  Thank you for referring Dawn Boyd for consultation and allowing me to participate in her care. I will keep you updated with the scheduling process for embolization. We will proceed with any insurance authorization necessary.  Sincerely,  Aletta Edouard, M.D.  Jonathon ResidesShon Baton, M.D.  Orie Rout, M.D.  Gus Height, MD   Electronically Signed   By: Aletta Edouard M.D.   On: 09/18/2013 08:57    Labs: Lab Results  Component Value Date   WBC 3.7* 09/29/2013   HCT 35.2* 09/29/2013   MCV 83.6 09/29/2013   PLT 187 09/29/2013   NA 142 09/29/2013   K 3.9 09/29/2013   CL 105 09/29/2013   CO2 25 09/29/2013   GLUCOSE 103* 09/29/2013   BUN 13 09/29/2013   CREATININE 0.84 09/29/2013   CALCIUM 9.5 09/29/2013   PROT 8.4* 08/21/2013   ALBUMIN 5.0 08/21/2013   AST 23 08/21/2013   ALT 15 08/21/2013   ALKPHOS 104 08/21/2013   BILITOT 2.1*  08/21/2013   GFRNONAA 84* 09/29/2013   GFRAA >90 09/29/2013   INR 1.14 09/29/2013    Assessment and Plan:  Splenic artery aneurysm Pt scheduled for embolization in IR today Pt aware of procedure benefits and risks and agreeable to proceed Consent signed andin chart Pt understands she may be admitted overnight Plan for discharge in am   Thank you for this interesting consult.  I greatly enjoyed meeting Kent Braunschweig and look forward to participating in their care.    Lavonia Drafts PAC-IR   I spent a total of 30 minutes face to face in clinical consultation, greater than 50% of which was counseling/coordinating care for splenic artery aneurysm embolization

## 2013-09-29 NOTE — Anesthesia Procedure Notes (Signed)
Procedure Name: Intubation Date/Time: 09/29/2013 10:54 AM Performed by: Ollen Bowl Pre-anesthesia Checklist: Patient identified, Emergency Drugs available, Suction available, Patient being monitored and Timeout performed Patient Re-evaluated:Patient Re-evaluated prior to inductionOxygen Delivery Method: Circle system utilized and Simple face mask Preoxygenation: Pre-oxygenation with 100% oxygen Intubation Type: IV induction Ventilation: Mask ventilation without difficulty Laryngoscope Size: Miller and 2 Grade View: Grade I Tube type: Oral Tube size: 7.0 mm Number of attempts: 1 Airway Equipment and Method: Patient positioned with wedge pillow and LTA kit utilized Placement Confirmation: ETT inserted through vocal cords under direct vision,  positive ETCO2 and breath sounds checked- equal and bilateral Secured at: 21 cm Tube secured with: Tape Dental Injury: Teeth and Oropharynx as per pre-operative assessment

## 2013-09-29 NOTE — Progress Notes (Signed)
Patient seen in recovery.  Feels tired.  No complaints.  Some nausea earlier.  Unable to reach splenic artery aneurysm to allow coiling.  Will d/w Dr. Scot Dock.

## 2013-09-29 NOTE — Progress Notes (Signed)
Foley d/c'ed intact at 1630 and client up and voided x 3 since

## 2013-09-30 ENCOUNTER — Other Ambulatory Visit: Payer: Self-pay | Admitting: *Deleted

## 2013-09-30 ENCOUNTER — Telehealth: Payer: Self-pay | Admitting: Vascular Surgery

## 2013-09-30 DIAGNOSIS — I728 Aneurysm of other specified arteries: Secondary | ICD-10-CM

## 2013-09-30 NOTE — Telephone Encounter (Signed)
Faxed referral/notified pt

## 2013-09-30 NOTE — Telephone Encounter (Signed)
Message copied by Gena Fray on Tue Sep 30, 2013 11:55 AM ------      Message from: [name]      Created: Tue Sep 30, 2013  8:59 AM      Regarding: gen surg apt       This patient was not able to have coil embolization of her splenic artery aneurysm which is in the hilum of her spleen. This reason she will need an appointment with general surgery to consider splenectomy. I discussed the case with Dr. Georganna Skeans so it would probably be best to get the appointment with him. I have spoken to the patient about this. Thank you. CD ------

## 2013-10-02 ENCOUNTER — Encounter (HOSPITAL_COMMUNITY): Payer: Self-pay | Admitting: Interventional Radiology

## 2013-10-13 ENCOUNTER — Other Ambulatory Visit (INDEPENDENT_AMBULATORY_CARE_PROVIDER_SITE_OTHER): Payer: Self-pay | Admitting: Surgery

## 2013-10-17 ENCOUNTER — Encounter (HOSPITAL_COMMUNITY): Payer: Self-pay | Admitting: Pharmacy Technician

## 2013-10-23 ENCOUNTER — Encounter (HOSPITAL_COMMUNITY): Payer: Self-pay

## 2013-10-23 ENCOUNTER — Encounter (HOSPITAL_COMMUNITY)
Admission: RE | Admit: 2013-10-23 | Discharge: 2013-10-23 | Disposition: A | Discharge status: Home / Self Care | Payer: BC Managed Care – PPO | Source: Ambulatory Visit | Attending: Surgery | Admitting: Surgery

## 2013-10-23 DIAGNOSIS — I728 Aneurysm of other specified arteries: Secondary | ICD-10-CM | POA: Insufficient documentation

## 2013-10-23 DIAGNOSIS — Z0000 Encounter for general adult medical examination without abnormal findings: Secondary | ICD-10-CM | POA: Insufficient documentation

## 2013-10-23 DIAGNOSIS — Z1501 Genetic susceptibility to malignant neoplasm of breast: Secondary | ICD-10-CM | POA: Insufficient documentation

## 2013-10-23 HISTORY — DX: Other difficulties with micturition: R39.198

## 2013-10-23 LAB — COMPREHENSIVE METABOLIC PANEL
ALT: 11 U/L (ref 0–35)
ANION GAP: 10 (ref 5–15)
AST: 18 U/L (ref 0–37)
Albumin: 4.4 g/dL (ref 3.5–5.2)
Alkaline Phosphatase: 89 U/L (ref 39–117)
BUN: 15 mg/dL (ref 6–23)
CALCIUM: 9.6 mg/dL (ref 8.4–10.5)
CO2: 27 mEq/L (ref 19–32)
Chloride: 103 mEq/L (ref 96–112)
Creatinine, Ser: 0.82 mg/dL (ref 0.50–1.10)
GFR calc non Af Amer: 86 mL/min — ABNORMAL LOW (ref >90)
GLUCOSE: 94 mg/dL (ref 70–99)
Potassium: 4.7 mEq/L (ref 3.7–5.3)
SODIUM: 140 meq/L (ref 137–147)
TOTAL PROTEIN: 7.4 g/dL (ref 6.0–8.3)
Total Bilirubin: 1.5 mg/dL — ABNORMAL HIGH (ref 0.3–1.2)

## 2013-10-23 LAB — CBC
HCT: 37.3 % (ref 36.0–46.0)
Hemoglobin: 12.6 g/dL (ref 12.0–15.0)
MCH: 29 pg (ref 26.0–34.0)
MCHC: 33.8 g/dL (ref 30.0–36.0)
MCV: 85.9 fL (ref 78.0–100.0)
Platelets: 186 10*3/uL (ref 150–400)
RBC: 4.34 MIL/uL (ref 3.87–5.11)
RDW: 11.8 % (ref 11.5–15.5)
WBC: 4.3 10*3/uL (ref 4.0–10.5)

## 2013-10-23 LAB — PROTIME-INR
INR: 1.1 (ref 0.00–1.49)
Prothrombin Time: 14.3 seconds (ref 11.6–15.2)

## 2013-10-23 LAB — APTT: aPTT: 31 seconds (ref 24–37)

## 2013-10-23 NOTE — Pre-Procedure Instructions (Signed)
10-23-13 EKG 8'15 Epic

## 2013-10-23 NOTE — Patient Instructions (Addendum)
[place]  10/23/2013   Your procedure is scheduled on: 10-13  -2015 Tuesday  Enter through [facility] Entrance and follow signs to [facility]. Arrive at   0730     AM ..  Call this number if you have problems the morning of surgery: [phone]  Or Presurgical Testing [phone].   For Living Will and/or Health Care Power Attorney Forms: please provide copy for your medical record,may bring AM of surgery(Forms should be already notarized -we do not provide this service).(10-23-13 Yes, prefers not to bring).  Remember: Follow any bowel prep instructions per MD office.    Do not eat food/ or drink: After Midnight.     Take these medicines the morning of surgery with A SIP OF WATER: none   Do not wear jewelry, make-up or nail polish.  Do not wear lotions, powders, or perfumes. You may not  wear deodorant.  Do not shave 48 hours(2 days) prior to first CHG shower(legs and under arms).(Shaving face and neck okay.)  Do not bring valuables to the hospital.(Hospital is not responsible for lost valuables).  Contacts, dentures or removable bridgework, body piercing, hair pins may not be worn into surgery.  Leave suitcase in the car. After surgery it may be brought to your room.  For patients admitted to the hospital, checkout time is 11:00 AM the day of discharge.(Restricted visitors-Any Persons displaying flu-like symptoms or illness).    Patients discharged the day of surgery will not be allowed to drive home. Must have responsible person with you x 24 hours once discharged.  Name and phone number of your driver: spouse Dellis Filbert- [phone] cell  Special Instructions: CHG(Chlorhedine 4%-"Hibiclens","Betasept","Aplicare") Shower Use Special Wash: see special instructions.(avoid face and genitals)        _____________________________    [facility] - Preparing for Surgery Before surgery, you can play an important role.  Because skin is not sterile, your skin needs to  be as free of germs as possible.  You can reduce the number of germs on your skin by washing with CHG (chlorahexidine gluconate) soap before surgery.  CHG is an antiseptic cleaner which kills germs and bonds with the skin to continue killing germs even after washing. Please DO NOT use if you have an allergy to CHG or antibacterial soaps.  If your skin becomes reddened/irritated stop using the CHG and inform your nurse when you arrive at Short Stay. Do not shave (including legs and underarms) for at least 48 hours prior to the first CHG shower.  You may shave your face/neck. Please follow these instructions carefully:  1.  Shower with CHG Soap the night before surgery and the  morning of Surgery.  2.  If you choose to wash your hair, wash your hair first as usual with your  normal  shampoo.  3.  After you shampoo, rinse your hair and body thoroughly to remove the  shampoo.                           4.  Use CHG as you would any other liquid soap.  You can apply chg directly  to the skin and wash                       Gently with a scrungie or clean washcloth.  5.  Apply the CHG Soap to your body ONLY FROM THE NECK DOWN.   Do not  use on face/ open                           Wound or open sores. Avoid contact with eyes, ears mouth and genitals (private parts).                       Wash face,  Genitals (private parts) with your normal soap.             6.  Wash thoroughly, paying special attention to the area where your surgery  will be performed.  7.  Thoroughly rinse your body with warm water from the neck down.  8.  DO NOT shower/wash with your normal soap after using and rinsing off  the CHG Soap.                9.  Pat yourself dry with a clean towel.            10.  Wear clean pajamas.            11.  Place clean sheets on your bed the night of your first shower and do not  sleep with pets. Day of Surgery : Do not apply any lotions/deodorants the morning of surgery.  Please wear clean clothes to  the hospital/surgery center.  FAILURE TO FOLLOW THESE INSTRUCTIONS MAY RESULT IN THE CANCELLATION OF YOUR SURGERY PATIENT SIGNATURE_________________________________  NURSE SIGNATURE__________________________________  ________________________________________________________________________

## 2013-10-27 NOTE — H&P (Signed)
Dawn Boyd 10/13/2013 2:17 PM Location: [facility] Patient #: [id number] DOB: October 14, 1970 Married / Language: English / Race: White Female  History of Present Illness (Jakyria Bleau A. Ninfa Linden MD; 10/13/2013 2:44 PM) Patient words: Splenic arterial aneurysm.  The patient is a 43 year old female who presents with a splenic disorder. this is a very pleasant woman who is referred by Dr. Joylene Igo for evaluation of a splenic artery aneurysm. This was discovered after CAT scans for left sided abdominal pain. she Was originally seen by Dr. Doren Custard who is a vascular surgeon. he recommended an attempt at embolization. This was attempted by Dr. Kathlene Cote of interventional radiology was unsuccessful. She is now being referred to consider splenectomy. Again, she has intermittent mild discomfort in the left upper quadrant of the abdomen. She is otherwise healthy and without complaints   Other Problems ([place], [place]; 10/13/2013 2:18 PM) Gastroesophageal Reflux Disease Heart murmur Hemorrhoids Migraine Headache Oophorectomy  Past Surgical History ([place], [place]; 10/13/2013 2:18 PM) Hysterectomy (not due to cancer) - Complete  Diagnostic Studies History ([place], [place]; 10/13/2013 2:18 PM) Colonoscopy within last year Mammogram within last year Pap Smear 1-5 years ago  Allergies ([place], [place]; 10/13/2013 2:20 PM) No Known Drug Allergies09/28/2015  Medication History ([place], [place]; 10/13/2013 2:20 PM) No Current Medications  Social History ([place], [place]; 10/13/2013 2:18 PM) Alcohol use Occasional alcohol use. Caffeine use Coffee. No drug use Tobacco use Former smoker.  Family History ([place], [place]; 10/13/2013 2:18 PM) Arthritis Family Members In General. Cancer Family Members In General. Cerebrovascular Accident Family Members In General. Diabetes Mellitus  Mother. Heart Disease Family Members In General. Heart disease in female family member before age 19 Hypertension Father, Mother. Migraine Headache Mother. Ovarian Cancer Mother.  Pregnancy / Birth History Alexander Bergeron [place], [place]; 10/13/2013 2:18 PM) Age at menarche 75 years. Age of menopause <44 Contraceptive History Oral contraceptives. Gravida 2 Maternal age 44-30 Para 2  Review of Systems ([place] RMA; 10/13/2013 2:18 PM) General Not Present- Appetite Loss, Chills, Fatigue, Fever, Night Sweats, Weight Gain and Weight Loss. Skin Not Present- Change in Wart/Mole, Dryness, Hives, Jaundice, New Lesions, Non-Healing Wounds, Rash and Ulcer. HEENT Present- Hoarseness, Seasonal Allergies and Sinus Pain. Not Present- Earache, Hearing Loss, Nose Bleed, Oral Ulcers, Ringing in the Ears, Sore Throat, Visual Disturbances, Wears glasses/contact lenses and Yellow Eyes. Respiratory Not Present- Bloody sputum, Chronic Cough, Difficulty Breathing, Snoring and Wheezing. Breast Not Present- Breast Mass, Breast Pain, Nipple Discharge and Skin Changes. Gastrointestinal Present- Bloating. Not Present- Abdominal Pain, Bloody Stool, Change in Bowel Habits, Chronic diarrhea, Constipation, Difficulty Swallowing, Excessive gas, Gets full quickly at meals, Hemorrhoids, Indigestion, Nausea, Rectal Pain and Vomiting. Female Genitourinary Present- Frequency and Urgency. Not Present- Nocturia, Painful Urination and Pelvic Pain. Musculoskeletal Present- Back Pain. Not Present- Joint Pain, Joint Stiffness, Muscle Pain, Muscle Weakness and Swelling of Extremities. Neurological Present- Headaches. Not Present- Decreased Memory, Fainting, Numbness, Seizures, Tingling, Tremor, Trouble walking and Weakness. Psychiatric Not Present- Anxiety, Bipolar, Change in Sleep Pattern, Depression, Fearful and Frequent crying. Endocrine Not Present- Cold Intolerance, Excessive Hunger, Hair Changes, Heat  Intolerance, Hot flashes and New Diabetes. Hematology Not Present- Easy Bruising, Excessive bleeding, Gland problems, HIV and Persistent Infections.   Vitals ([place] Maldonado RMA; 10/13/2013 2:19 PM) 10/13/2013 2:18 PM Weight: 118.2 lb Height: 63in Body Surface Area: 1.54 m Body Mass Index: 20.94 kg/m Temp.: 98.56F(Oral)  Pulse: 69 (Regular)  BP: 120/80 (Sitting, Left Arm, Standard)    Physical Exam Nathaneil Canary  A. Ninfa Linden MD; 10/13/2013 2:45 PM) General Note: well-developed, well-nourished in no acute distress   Head and Neck Note: normocephalic, atraumatic external ears and nose are normal hearing is normal and oropharynx is clear neck is supple, trachea is midline   Eye Note: pupils reactive bilaterally, anicteric   Chest and Lung Exam Note: clear bilaterally with normal respiratory effort   Cardiovascular Note: regular rate and rhythm with no murmurs or peripheral edema   Abdomen Note: soft, nontender, nondistended, no palpable masses, no splenomegaly   Peripheral Vascular Note: pulses intact throughout   Neurologic Note: awake, alert, and oriented x3   Neuropsychiatric Note: judgment and affect are normal   Musculoskeletal Note: no long bone abnormalities     Assessment & Plan (Zeniah Briney A. Ninfa Linden MD; 10/13/2013 2:47 PM) SPLENIC ARTERY ANEURYSM (442.83  I72.8) Impression: I have reviewed her scans. I discussed the diagnosis with her in detail. Given her symptoms and fluctuation of her blood pressure, she Wishes to proceed with surgery which I feel is reasonable. I discussed laparoscopic splenectomy in detail. I discussed the risks which includes but is not limited to bleeding, infection, injury to surrounding structures, the need to convert to an open procedure, injury to the pancreas, need for vaccinations, postsplenectomy sepsis, cardiopulmonary issues, et Ronney Asters. She understands and wishes to proceed. Surgery will be  scheduled

## 2013-10-28 ENCOUNTER — Encounter (HOSPITAL_COMMUNITY): Payer: BC Managed Care – PPO | Admitting: Anesthesiology

## 2013-10-28 ENCOUNTER — Inpatient Hospital Stay (HOSPITAL_COMMUNITY)
Admission: RE | Admit: 2013-10-28 | Discharge: 2013-10-31 | DRG: 264 | Disposition: A | Discharge status: Home / Self Care | Payer: BC Managed Care – PPO | Source: Ambulatory Visit | Attending: Surgery | Admitting: Surgery

## 2013-10-28 ENCOUNTER — Encounter (HOSPITAL_COMMUNITY): Payer: Self-pay | Admitting: *Deleted

## 2013-10-28 ENCOUNTER — Encounter (HOSPITAL_COMMUNITY)
Admission: RE | Disposition: A | Discharge status: Home / Self Care | Payer: Self-pay | Source: Ambulatory Visit | Attending: Surgery

## 2013-10-28 ENCOUNTER — Inpatient Hospital Stay (HOSPITAL_COMMUNITY): Payer: BC Managed Care – PPO | Admitting: Anesthesiology

## 2013-10-28 DIAGNOSIS — Z9081 Acquired absence of spleen: Secondary | ICD-10-CM

## 2013-10-28 DIAGNOSIS — G43909 Migraine, unspecified, not intractable, without status migrainosus: Secondary | ICD-10-CM | POA: Diagnosis present

## 2013-10-28 DIAGNOSIS — Z01812 Encounter for preprocedural laboratory examination: Secondary | ICD-10-CM | POA: Diagnosis not present

## 2013-10-28 DIAGNOSIS — Z8041 Family history of malignant neoplasm of ovary: Secondary | ICD-10-CM

## 2013-10-28 DIAGNOSIS — K219 Gastro-esophageal reflux disease without esophagitis: Secondary | ICD-10-CM | POA: Diagnosis present

## 2013-10-28 DIAGNOSIS — Z87891 Personal history of nicotine dependence: Secondary | ICD-10-CM

## 2013-10-28 DIAGNOSIS — I739 Peripheral vascular disease, unspecified: Secondary | ICD-10-CM | POA: Diagnosis present

## 2013-10-28 DIAGNOSIS — I728 Aneurysm of other specified arteries: Secondary | ICD-10-CM | POA: Diagnosis present

## 2013-10-28 DIAGNOSIS — R339 Retention of urine, unspecified: Secondary | ICD-10-CM | POA: Diagnosis not present

## 2013-10-28 HISTORY — PX: LAPAROSCOPIC SPLENECTOMY: SHX409

## 2013-10-28 SURGERY — SPLENECTOMY, LAPAROSCOPIC
Anesthesia: General | Site: Abdomen

## 2013-10-28 MED ORDER — HYDROMORPHONE HCL 1 MG/ML IJ SOLN
1.0000 mg | INTRAMUSCULAR | Status: DC | PRN
  Administered 2013-10-28 – 2013-10-30 (×14): 1 mg via INTRAVENOUS
  Filled 2013-10-28 (×15): qty 1

## 2013-10-28 MED ORDER — MEPERIDINE HCL 50 MG/ML IJ SOLN
6.2500 mg | INTRAMUSCULAR | Status: DC | PRN
  Administered 2013-10-28: 12.5 mg via INTRAVENOUS

## 2013-10-28 MED ORDER — LACTATED RINGERS IR SOLN
Status: DC | PRN
  Administered 2013-10-28: 1000 mL

## 2013-10-28 MED ORDER — SUFENTANIL CITRATE 50 MCG/ML IV SOLN
INTRAVENOUS | Status: AC
  Filled 2013-10-28: qty 1

## 2013-10-28 MED ORDER — NEOSTIGMINE METHYLSULFATE 10 MG/10ML IV SOLN
INTRAVENOUS | Status: AC
  Filled 2013-10-28: qty 1

## 2013-10-28 MED ORDER — LIDOCAINE HCL (CARDIAC) 20 MG/ML IV SOLN
INTRAVENOUS | Status: AC
  Filled 2013-10-28: qty 5

## 2013-10-28 MED ORDER — ATROPINE SULFATE 0.4 MG/ML IJ SOLN
INTRAMUSCULAR | Status: AC
  Filled 2013-10-28: qty 1

## 2013-10-28 MED ORDER — ONDANSETRON HCL 4 MG PO TABS
4.0000 mg | ORAL_TABLET | Freq: Four times a day (QID) | ORAL | Status: DC | PRN
  Administered 2013-10-31: 4 mg via ORAL
  Filled 2013-10-28: qty 1

## 2013-10-28 MED ORDER — MORPHINE SULFATE 2 MG/ML IJ SOLN: 1.0000 mg | INTRAMUSCULAR | Status: DC | PRN

## 2013-10-28 MED ORDER — 0.9 % SODIUM CHLORIDE (POUR BTL) OPTIME
TOPICAL | Status: DC | PRN
  Administered 2013-10-28: 1000 mL

## 2013-10-28 MED ORDER — PROMETHAZINE HCL 25 MG/ML IJ SOLN
6.2500 mg | INTRAMUSCULAR | Status: AC | PRN
  Administered 2013-10-28 (×2): 6.25 mg via INTRAVENOUS

## 2013-10-28 MED ORDER — CEFAZOLIN SODIUM-DEXTROSE 2-3 GM-% IV SOLR
INTRAVENOUS | Status: AC
  Filled 2013-10-28: qty 50

## 2013-10-28 MED ORDER — PROPOFOL 10 MG/ML IV BOLUS
INTRAVENOUS | Status: DC | PRN
  Administered 2013-10-28: 150 mg via INTRAVENOUS

## 2013-10-28 MED ORDER — ENOXAPARIN SODIUM 40 MG/0.4ML ~~LOC~~ SOLN
40.0000 mg | SUBCUTANEOUS | Status: DC
  Administered 2013-10-29 – 2013-10-30 (×2): 40 mg via SUBCUTANEOUS
  Filled 2013-10-28 (×3): qty 0.4

## 2013-10-28 MED ORDER — SUFENTANIL CITRATE 50 MCG/ML IV SOLN
INTRAVENOUS | Status: DC | PRN
  Administered 2013-10-28 (×2): 5 ug via INTRAVENOUS
  Administered 2013-10-28 (×2): 10 ug via INTRAVENOUS
  Administered 2013-10-28 (×3): 5 ug via INTRAVENOUS

## 2013-10-28 MED ORDER — SODIUM CHLORIDE 0.9 % IJ SOLN
INTRAMUSCULAR | Status: AC
  Filled 2013-10-28: qty 20

## 2013-10-28 MED ORDER — ONDANSETRON HCL 4 MG/2ML IJ SOLN
4.0000 mg | Freq: Four times a day (QID) | INTRAMUSCULAR | Status: DC | PRN
  Administered 2013-10-29 – 2013-10-31 (×6): 4 mg via INTRAVENOUS
  Filled 2013-10-28 (×6): qty 2

## 2013-10-28 MED ORDER — OXYCODONE-ACETAMINOPHEN 5-325 MG PO TABS
1.0000 | ORAL_TABLET | ORAL | Status: DC | PRN
  Administered 2013-10-29: 1 via ORAL
  Administered 2013-10-29: 2 via ORAL
  Administered 2013-10-30: 1 via ORAL
  Administered 2013-10-30 (×2): 2 via ORAL
  Administered 2013-10-30: 1 via ORAL
  Administered 2013-10-31 (×2): 2 via ORAL
  Filled 2013-10-28: qty 1
  Filled 2013-10-28 (×4): qty 2
  Filled 2013-10-28 (×3): qty 1
  Filled 2013-10-28: qty 2

## 2013-10-28 MED ORDER — MEPERIDINE HCL 50 MG/ML IJ SOLN
INTRAMUSCULAR | Status: AC
  Filled 2013-10-28: qty 1

## 2013-10-28 MED ORDER — LACTATED RINGERS IV SOLN: INTRAVENOUS | Status: DC

## 2013-10-28 MED ORDER — LACTATED RINGERS IV SOLN
INTRAVENOUS | Status: DC | PRN
  Administered 2013-10-28 (×2): via INTRAVENOUS

## 2013-10-28 MED ORDER — MIDAZOLAM HCL 2 MG/2ML IJ SOLN
INTRAMUSCULAR | Status: AC
  Filled 2013-10-28: qty 2

## 2013-10-28 MED ORDER — GLYCOPYRROLATE 0.2 MG/ML IJ SOLN
INTRAMUSCULAR | Status: AC
Start: 2013-10-28 — End: 2013-10-28
  Filled 2013-10-28: qty 3

## 2013-10-28 MED ORDER — PROMETHAZINE HCL 25 MG/ML IJ SOLN
12.5000 mg | Freq: Four times a day (QID) | INTRAMUSCULAR | Status: DC | PRN
  Administered 2013-10-28: 12.5 mg via INTRAVENOUS
  Filled 2013-10-28 (×2): qty 1

## 2013-10-28 MED ORDER — NEOSTIGMINE METHYLSULFATE 10 MG/10ML IV SOLN
INTRAVENOUS | Status: DC | PRN
  Administered 2013-10-28: 4 mg via INTRAVENOUS

## 2013-10-28 MED ORDER — CEFAZOLIN SODIUM-DEXTROSE 2-3 GM-% IV SOLR
2.0000 g | INTRAVENOUS | Status: AC
  Administered 2013-10-28: 2 g via INTRAVENOUS

## 2013-10-28 MED ORDER — ROCURONIUM BROMIDE 100 MG/10ML IV SOLN
INTRAVENOUS | Status: DC | PRN
  Administered 2013-10-28: 10 mg via INTRAVENOUS
  Administered 2013-10-28: 40 mg via INTRAVENOUS
  Administered 2013-10-28: 10 mg via INTRAVENOUS

## 2013-10-28 MED ORDER — POTASSIUM CHLORIDE IN NACL 20-0.9 MEQ/L-% IV SOLN
INTRAVENOUS | Status: DC
Start: 2013-10-28 — End: 2013-10-31
  Administered 2013-10-28: 17:00:00 via INTRAVENOUS
  Administered 2013-10-29 – 2013-10-30 (×2): 75 mL via INTRAVENOUS
  Administered 2013-10-30: 50 mL via INTRAVENOUS
  Filled 2013-10-28 (×6): qty 1000

## 2013-10-28 MED ORDER — PROPOFOL 10 MG/ML IV BOLUS
INTRAVENOUS | Status: AC
  Filled 2013-10-28: qty 20

## 2013-10-28 MED ORDER — ROCURONIUM BROMIDE 100 MG/10ML IV SOLN
INTRAVENOUS | Status: AC
  Filled 2013-10-28: qty 1

## 2013-10-28 MED ORDER — EPHEDRINE SULFATE 50 MG/ML IJ SOLN
INTRAMUSCULAR | Status: AC
  Filled 2013-10-28: qty 1

## 2013-10-28 MED ORDER — MIDAZOLAM HCL 5 MG/5ML IJ SOLN
INTRAMUSCULAR | Status: DC | PRN
  Administered 2013-10-28: 2 mg via INTRAVENOUS

## 2013-10-28 MED ORDER — BUPIVACAINE-EPINEPHRINE 0.25% -1:200000 IJ SOLN
INTRAMUSCULAR | Status: DC | PRN
  Administered 2013-10-28: 20 mL

## 2013-10-28 MED ORDER — PROMETHAZINE HCL 25 MG/ML IJ SOLN
INTRAMUSCULAR | Status: AC
  Filled 2013-10-28: qty 1

## 2013-10-28 MED ORDER — ATROPINE SULFATE 0.4 MG/ML IJ SOLN
INTRAMUSCULAR | Status: DC | PRN
  Administered 2013-10-28: .8 mg via INTRAVENOUS

## 2013-10-28 MED ORDER — POTASSIUM CHLORIDE IN NACL 20-0.9 MEQ/L-% IV SOLN
INTRAVENOUS | Status: AC
  Filled 2013-10-28: qty 1000

## 2013-10-28 MED ORDER — ONDANSETRON HCL 4 MG/2ML IJ SOLN
INTRAMUSCULAR | Status: DC | PRN
  Administered 2013-10-28: 4 mg via INTRAVENOUS

## 2013-10-28 MED ORDER — BUPIVACAINE-EPINEPHRINE (PF) 0.25% -1:200000 IJ SOLN
INTRAMUSCULAR | Status: AC
  Filled 2013-10-28: qty 30

## 2013-10-28 MED ORDER — HYDROMORPHONE HCL 1 MG/ML IJ SOLN: 0.2500 mg | INTRAMUSCULAR | Status: DC | PRN

## 2013-10-28 MED ORDER — LIDOCAINE HCL (CARDIAC) 20 MG/ML IV SOLN
INTRAVENOUS | Status: DC | PRN
  Administered 2013-10-28: 80 mg via INTRAVENOUS

## 2013-10-28 MED ORDER — ONDANSETRON HCL 4 MG/2ML IJ SOLN
INTRAMUSCULAR | Status: AC
  Filled 2013-10-28: qty 2

## 2013-10-28 MED ORDER — GLYCOPYRROLATE 0.2 MG/ML IJ SOLN
INTRAMUSCULAR | Status: DC | PRN
  Administered 2013-10-28: .6 mg via INTRAVENOUS

## 2013-10-28 SURGICAL SUPPLY — 64 items
ADH SKN CLS APL DERMABOND .7 (GAUZE/BANDAGES/DRESSINGS) ×1
ANCHOR TIS RET SYS 200 (MISCELLANEOUS) IMPLANT
APPLIER CLIP ROT 10 11.4 M/L (STAPLE) ×3
APR CLP MED LRG 11.4X10 (STAPLE) ×1
BAG SPEC RTRVL 10 TROC 200 (ENDOMECHANICALS) ×1
BLADE EXTENDED COATED 6.5IN (ELECTRODE) ×2 IMPLANT
CLAMP ENDO BABCK 10MM (STAPLE) IMPLANT
CLIP APPLIE ROT 10 11.4 M/L (STAPLE) IMPLANT
CONNECTOR 5 IN 1 STRAIGHT STRL (MISCELLANEOUS) ×3 IMPLANT
COVER SURGICAL LIGHT HANDLE (MISCELLANEOUS) ×1 IMPLANT
DECANTER SPIKE VIAL GLASS SM (MISCELLANEOUS) ×1 IMPLANT
DERMABOND ADVANCED (GAUZE/BANDAGES/DRESSINGS) ×2
DERMABOND ADVANCED .7 DNX12 (GAUZE/BANDAGES/DRESSINGS) IMPLANT
DISSECTOR BLUNT TIP ENDO 5MM (MISCELLANEOUS) ×3 IMPLANT
DRAPE LAPAROSCOPIC ABDOMINAL (DRAPES) ×3 IMPLANT
DRAPE WARM FLUID 44X44 (DRAPE) ×3 IMPLANT
FILTER SMOKE EVAC LAPAROSHD (FILTER) ×1 IMPLANT
GLOVE BIOGEL PI IND STRL 6.5 (GLOVE) IMPLANT
GLOVE BIOGEL PI IND STRL 7.0 (GLOVE) ×1 IMPLANT
GLOVE BIOGEL PI IND STRL 7.5 (GLOVE) IMPLANT
GLOVE BIOGEL PI INDICATOR 6.5 (GLOVE) ×6
GLOVE BIOGEL PI INDICATOR 7.0 (GLOVE) ×4
GLOVE BIOGEL PI INDICATOR 7.5 (GLOVE) ×4
GLOVE SURG ORTHO 8.0 STRL STRW (GLOVE) ×2 IMPLANT
GLOVE SURG SIGNA 7.5 PF LTX (GLOVE) ×3 IMPLANT
GOWN STRL REUS W/TWL LRG LVL3 (GOWN DISPOSABLE) ×1 IMPLANT
GOWN STRL REUS W/TWL XL LVL3 (GOWN DISPOSABLE) ×10 IMPLANT
HANDLE STAPLE EGIA 4 XL (STAPLE) IMPLANT
KIT BASIN OR (CUSTOM PROCEDURE TRAY) ×3 IMPLANT
NS IRRIG 1000ML POUR BTL (IV SOLUTION) ×3 IMPLANT
PENCIL BUTTON HOLSTER BLD 10FT (ELECTRODE) ×3 IMPLANT
POUCH ENDO CATCH II 15MM (MISCELLANEOUS) IMPLANT
POUCH RETRIEVAL ECOSAC 10 (ENDOMECHANICALS) IMPLANT
POUCH RETRIEVAL ECOSAC 10MM (ENDOMECHANICALS) ×2
RELOAD WHITE ECR60W (STAPLE) ×4 IMPLANT
SCISSORS LAP 5X35 DISP (ENDOMECHANICALS) ×3 IMPLANT
SET IRRIG TUBING LAPAROSCOPIC (IRRIGATION / IRRIGATOR) ×3 IMPLANT
SHEARS HARMONIC ACE PLUS 36CM (ENDOMECHANICALS) ×3 IMPLANT
SLEEVE XCEL OPT CAN 5 100 (ENDOMECHANICALS) ×2 IMPLANT
SLEEVE Z-THREAD 5X100MM (TROCAR) IMPLANT
SOLUTION ANTI FOG 6CC (MISCELLANEOUS) ×3 IMPLANT
SPONGE LAP 18X18 X RAY DECT (DISPOSABLE) ×1 IMPLANT
STAPLE ECHEON FLEX 60 POW ENDO (STAPLE) ×2 IMPLANT
STAPLER VISISTAT 35W (STAPLE) ×1 IMPLANT
SUT MNCRL AB 4-0 PS2 18 (SUTURE) ×2 IMPLANT
SUT NOVA NAB DX-16 0-1 5-0 T12 (SUTURE) ×2 IMPLANT
SUT VIC AB 3-0 SH 27 (SUTURE) ×3
SUT VIC AB 3-0 SH 27XBRD (SUTURE) IMPLANT
SUT VIC AB 4-0 SH 18 (SUTURE) ×1 IMPLANT
SYR 30ML LL (SYRINGE) ×1 IMPLANT
SYS LAPSCP GELPORT 120MM (MISCELLANEOUS)
SYSTEM LAPSCP GELPORT 120MM (MISCELLANEOUS) IMPLANT
TAPE CLOTH 4X10 WHT NS (GAUZE/BANDAGES/DRESSINGS) IMPLANT
TOWEL OR 17X26 10 PK STRL BLUE (TOWEL DISPOSABLE) ×3 IMPLANT
TRAY FOLEY CATH 14FRSI W/METER (CATHETERS) ×3 IMPLANT
TRAY LAPAROSCOPIC (CUSTOM PROCEDURE TRAY) ×3 IMPLANT
TROCAR BLADELESS OPT 5 100 (ENDOMECHANICALS) ×5 IMPLANT
TROCAR BLADELESS OPT 5 75 (ENDOMECHANICALS) IMPLANT
TROCAR XCEL 12X100 BLDLESS (ENDOMECHANICALS) ×2 IMPLANT
TROCAR XCEL NON-BLD 11X100MML (ENDOMECHANICALS) ×2 IMPLANT
TROCAR XCEL UNIV SLVE 11M 100M (ENDOMECHANICALS) IMPLANT
TUBING FILTER THERMOFLATOR (ELECTROSURGICAL) ×3 IMPLANT
WATER STERILE IRR 1500ML POUR (IV SOLUTION) ×1 IMPLANT
YANKAUER SUCT BULB TIP 10FT TU (MISCELLANEOUS) ×3 IMPLANT

## 2013-10-28 NOTE — Op Note (Signed)
NAME:  [name], [name] NO.:  [id number]  MEDICAL RECORD NO.:  [id number]  LOCATION:  [facility]                         FACILITY:  [facility]  PHYSICIAN:  Coralie Keens, M.D. DATE OF BIRTH:  11-10-1970  DATE OF PROCEDURE:  10/28/2013 DATE OF DISCHARGE:                              OPERATIVE REPORT   PREOPERATIVE DIAGNOSIS:  Splenic artery aneurysm.  POSTOPERATIVE DIAGNOSIS:  Splenic artery aneurysm.  PROCEDURE:  Laparoscopic splenectomy.  SURGEON:  Coralie Keens, M.D.  ASSISTANCE:  Earnstine Regal, MD.  ANESTHESIA:  General endotracheal anesthesia.  ESTIMATED BLOOD LOSS:  Minimal.  INDICATIONS:  This is a 43 year old female who presented with left upper quadrant abdominal pain.  She was found on CAT scan to have a splenic artery aneurysm.  Attempt was made at Interventional Radiology embolizing the aneurysm, but this was unsuccessful.  Therefore, she presents for laparoscopic splenectomy.  PROCEDURE IN DETAIL:  The patient was brought to the operative room, identified as Dawn Boyd.  She was placed supine on the operating room table and general anesthesia was induced.  She was then placed in the right lateral decubitus position and supported with a bean bag.  Her abdomen was then prepped and draped in the usual sterile fashion.  I made a small longitudinal incision above the umbilicus.  I took this down to fascia, which was then opened with a scalpel.  A hemostat was used to pass into the peritoneal cavity.  A 0 Vicryl pursestring suture was then placed around the fascial opening.  The Hasson port was placed through the opening and insufflation of the abdomen was begun.  I placed another 5-mm port in the patient's epigastrium, a 12-mm port in the patient's left lateral side.  I then placed another 5-mm port under direct vision in the left mid abdomen.  The spleen was easily identified.  I took down the posterior attachments through Harmonic scalpel.  I then  took down small attachments to the lesser pole with Harmonic Scalpel as well.  I then identified the short gastrics, took these down with the Harmonic Scalpel also.  I kept working inferior, superior, and slowly freed up the spleen up into a pedicle.  One small bridging vessel had to be clipped near the lower pole with a surgical clip.  I could easily identify the larger aneurysm coming off the vessel of the superior pole.  The smaller aneurysm was difficult to visualize. I was finally able to identify the very tortuous splenic artery as well as splenic vein.  I was able to come across the artery with a single firing of the endovascular stapler.  I then elevated the spleen at the vein and came across this with an endovascular stapler as well.  This easily transected the vessels at the pedicle and the staple lines appeared quite hemostatic.  I then placed an endosac through the 12 mm trocar and placed the spleen into the endosac.  I then pulled this endosac up through the laterals.  I then pulled the spleen and bag up through the lateral port site.  I had to make a skin incision larger with a scalpel.  I then morcellated the spleen and  was then able to pull the bag and spleen out through the incision.  I then closed the fascial defect with several interrupted #1 Novafil sutures at the lateral port site.  I then reinsufflated the abdomen and copiously irrigated with normal saline.  Hemostasis appeared to be achieved.  All ports were then removed under direct vision, the abdomen was deflated.  The 0 Vicryl at the umbilicus was tied in place and closing the fascial defect there as well.  All incisions were then anesthetized with Marcaine and closed with 4-0 Monocryl subcuticular sutures.  Dermabond was then applied. The patient tolerated the procedure well.  All the counts were correct at the end of the procedure.  The patient was then extubated in operating room and taken in stable condition  to recovery room.     Coralie Keens, M.D.     DB/MEDQ  D:  10/28/2013  T:  10/28/2013  Job:  [id number]

## 2013-10-28 NOTE — Op Note (Signed)
LAPAROSCOPIC SPLENECTOMY  Procedure Note  Dawn Boyd 10/28/2013   Pre-op Diagnosis: Splenic Artery Aneurysm     Post-op Diagnosis: same  Procedure(s): LAPAROSCOPIC SPLENECTOMY  Surgeon(s): Coralie Keens, MD Armandina Gemma, MD  Anesthesia: General  Staff:  Circulator: Tamala Julian, RN Scrub Person: Wynell Balloon, CST; Tiki W Burris  Estimated Blood Loss: Minimal               Specimens: sent to path          Dawn Boyd   Date: 10/28/2013  Time: 12:43 PM

## 2013-10-28 NOTE — Anesthesia Preprocedure Evaluation (Signed)
Anesthesia Evaluation  Patient identified by MRN, date of birth, ID band Patient awake    Reviewed: Allergy & Precautions, H&P , NPO status , Patient's Chart, lab work & pertinent test results  History of Anesthesia Complications (+) PONV and history of anesthetic complications  Airway Mallampati: II TM Distance: >3 FB Neck ROM: Full    Dental  (+) Teeth Intact, Dental Advisory Given   Pulmonary former smoker,    Pulmonary exam normal       Cardiovascular + Peripheral Vascular Disease     Neuro/Psych  Headaches, PSYCHIATRIC DISORDERS Anxiety Depression    GI/Hepatic Neg liver ROS, GERD-  ,  Endo/Other  negative endocrine ROS  Renal/GU negative Renal ROS     Musculoskeletal   Abdominal   Peds  Hematology   Anesthesia Other Findings   Reproductive/Obstetrics                           Anesthesia Physical  Anesthesia Plan  ASA: II  Anesthesia Plan: General   Post-op Pain Management:    Induction: Intravenous  Airway Management Planned: Oral ETT  Additional Equipment:   Intra-op Plan:   Post-operative Plan: Extubation in OR  Informed Consent: I have reviewed the patients History and Physical, chart, labs and discussed the procedure including the risks, benefits and alternatives for the proposed anesthesia with the patient or authorized representative who has indicated his/her understanding and acceptance.   Dental advisory given  Plan Discussed with: CRNA, Anesthesiologist and Surgeon  Anesthesia Plan Comments:         Anesthesia Quick Evaluation

## 2013-10-28 NOTE — Interval H&P Note (Signed)
History and Physical Interval Note: no change in H and P  10/28/2013 10:17 AM  Dawn Boyd  has presented today for surgery, with the diagnosis of Splenic Artery Aneurysm  The various methods of treatment have been discussed with the patient and family. After consideration of risks, benefits and other options for treatment, the patient has consented to  Procedure(s): LAPAROSCOPIC SPLENECTOMY (N/A) as a surgical intervention .  The patient's history has been reviewed, patient examined, no change in status, stable for surgery.  I have reviewed the patient's chart and labs.  Questions were answered to the patient's satisfaction.     Dawn Boyd A

## 2013-10-28 NOTE — Transfer of Care (Signed)
Immediate Anesthesia Transfer of Care Note  Patient: Dawn Boyd  Procedure(s) Performed: Procedure(s): LAPAROSCOPIC SPLENECTOMY (N/A)  Patient Location: PACU  Anesthesia Type:General  Level of Consciousness: awake and oriented  Airway & Oxygen Therapy: Patient Spontanous Breathing and Patient connected to face mask oxygen  Post-op Assessment: Report given to PACU RN and Post -op Vital signs reviewed and stable  Post vital signs: Reviewed and stable  Complications: No apparent anesthesia complications

## 2013-10-28 NOTE — Progress Notes (Signed)
Report from [place], Therapist, sports. Pt resting in bed, c/o nausea and pain. Medicated w/ phenergan and dilaudid as ordered. Abd dermabond sites intact. Pt oriented to [place]bell and environment. POC discussed.

## 2013-10-29 ENCOUNTER — Encounter (HOSPITAL_COMMUNITY): Payer: Self-pay | Admitting: Surgery

## 2013-10-29 LAB — BASIC METABOLIC PANEL
Anion gap: 10 (ref 5–15)
BUN: 11 mg/dL (ref 6–23)
CO2: 26 mEq/L (ref 19–32)
Calcium: 8.6 mg/dL (ref 8.4–10.5)
Chloride: 104 mEq/L (ref 96–112)
Creatinine, Ser: 0.89 mg/dL (ref 0.50–1.10)
GFR, EST NON AFRICAN AMERICAN: 78 mL/min — AB (ref >90)
Glucose, Bld: 94 mg/dL (ref 70–99)
POTASSIUM: 4.2 meq/L (ref 3.7–5.3)
SODIUM: 140 meq/L (ref 137–147)

## 2013-10-29 LAB — CBC
HCT: 33.5 % — ABNORMAL LOW (ref 36.0–46.0)
Hemoglobin: 11.2 g/dL — ABNORMAL LOW (ref 12.0–15.0)
MCH: 28.9 pg (ref 26.0–34.0)
MCHC: 33.4 g/dL (ref 30.0–36.0)
MCV: 86.6 fL (ref 78.0–100.0)
Platelets: 219 10*3/uL (ref 150–400)
RBC: 3.87 MIL/uL (ref 3.87–5.11)
RDW: 11.9 % (ref 11.5–15.5)
WBC: 11.2 10*3/uL — ABNORMAL HIGH (ref 4.0–10.5)

## 2013-10-29 MED ORDER — ASPIRIN-ACETAMINOPHEN-CAFFEINE 250-250-65 MG PO TABS
0.5000 | ORAL_TABLET | Freq: Four times a day (QID) | ORAL | Status: DC | PRN
  Filled 2013-10-29: qty 1

## 2013-10-29 MED ORDER — SUMATRIPTAN SUCCINATE 50 MG PO TABS
50.0000 mg | ORAL_TABLET | ORAL | Status: DC | PRN
  Administered 2013-10-29 – 2013-10-30 (×2): 50 mg via ORAL
  Filled 2013-10-29 (×3): qty 1

## 2013-10-29 NOTE — Anesthesia Postprocedure Evaluation (Signed)
  Anesthesia Post-op Note  Patient: Dawn Boyd  Procedure(s) Performed: Procedure(s) (LRB): LAPAROSCOPIC SPLENECTOMY (N/A)  Patient Location: PACU  Anesthesia Type: General  Level of Consciousness: awake and alert   Airway and Oxygen Therapy: Patient Spontanous Breathing  Post-op Pain: mild  Post-op Assessment: Post-op Vital signs reviewed, Patient's Cardiovascular Status Stable, Respiratory Function Stable, Patent Airway and No signs of Nausea or vomiting  Last Vitals:  Filed Vitals:   10/29/13 1000  BP: 119/74  Pulse: 60  Temp: 36.7 C  Resp: 18    Post-op Vital Signs: stable   Complications: No apparent anesthesia complications

## 2013-10-29 NOTE — Progress Notes (Signed)
1 Day Post-Op  Subjective: comfortable  Objective: Vital signs in last 24 hours: Temp:  [97.9 F (36.6 C)-99.1 F (37.3 C)] 97.9 F (36.6 C) (10/14 0600) Pulse Rate:  [61-79] 71 (10/14 0600) Resp:  [10-16] 16 (10/14 0600) BP: (100-157)/(65-85) 115/76 mmHg (10/14 0600) SpO2:  [100 %] 100 % (10/14 0600) Last BM Date: 10/27/13  Intake/Output from previous day: 10/13 0701 - 10/14 0700 In: 2060 [P.O.:60; I.V.:2000] Out: 1260 [Urine:1260] Intake/Output this shift:    Abdomen soft, incisions clean  Lab Results:   Recent Labs  10/29/13 0500  WBC 11.2*  HGB 11.2*  HCT 33.5*  PLT 219   BMET  Recent Labs  10/29/13 0500  NA 140  K 4.2  CL 104  CO2 26  GLUCOSE 94  BUN 11  CREATININE 0.89  CALCIUM 8.6   PT/INR No results found for this basename: LABPROT, INR,  in the last 72 hours ABG No results found for this basename: PHART, PCO2, PO2, HCO3,  in the last 72 hours  Studies/Results: No results found.  Anti-infectives: Anti-infectives   Start     Dose/Rate Route Frequency Ordered Stop   10/28/13 0732  ceFAZolin (ANCEF) IVPB 2 g/50 mL premix     2 g 100 mL/hr over 30 Minutes Intravenous On call to O.R. 10/28/13 0732 10/28/13 1035      Assessment/Plan: s/p Procedure(s): LAPAROSCOPIC SPLENECTOMY (N/A)  Leave foley until tomorrow.  She has a history of urinary retention after any procedure. Advance diet and activity  LOS: 1 day    Tanishka Drolet A 10/29/2013

## 2013-10-30 MED ORDER — SUMATRIPTAN SUCCINATE 50 MG PO TABS
50.0000 mg | ORAL_TABLET | ORAL | Status: DC | PRN
  Filled 2013-10-30: qty 1

## 2013-10-30 MED ORDER — PROMETHAZINE HCL 25 MG/ML IJ SOLN
12.5000 mg | Freq: Four times a day (QID) | INTRAMUSCULAR | Status: DC | PRN
  Administered 2013-10-30: 12.5 mg via INTRAVENOUS
  Filled 2013-10-30: qty 1

## 2013-10-30 NOTE — Progress Notes (Signed)
Pt voided 500 complaining of urinary retention and pain. Bladder scanned and got 730m. Order received from Dr. BNinfa Lindento in and out cath once. ALynann Bologna RN 10/30/13 [phone]

## 2013-10-30 NOTE — Progress Notes (Signed)
2 Days Post-Op  Subjective: POD#2 Complains for incisional pain, migraine  Objective: Vital signs in last 24 hours: Temp:  [98.1 F (36.7 C)-99.8 F (37.7 C)] 99.8 F (37.7 C) (10/14 2130) Pulse Rate:  [60-84] 84 (10/14 2130) Resp:  [18] 18 (10/14 2130) BP: (112-132)/(74-84) 132/84 mmHg (10/14 2130) SpO2:  [100 %] 100 % (10/14 2130) Last BM Date: 10/27/13  Intake/Output from previous day: 10/14 0701 - 10/15 0700 In: 2431.3 [P.O.:840; I.V.:1591.3] Out: 2700 [Urine:2700] Intake/Output this shift: Total I/O In: -  Out: 900 [Urine:900]  Abdomen soft, non-distended, incisions clean  Lab Results:   Recent Labs  10/29/13 0500  WBC 11.2*  HGB 11.2*  HCT 33.5*  PLT 219   BMET  Recent Labs  10/29/13 0500  NA 140  K 4.2  CL 104  CO2 26  GLUCOSE 94  BUN 11  CREATININE 0.89  CALCIUM 8.6   PT/INR No results found for this basename: LABPROT, INR,  in the last 72 hours ABG No results found for this basename: PHART, PCO2, PO2, HCO3,  in the last 72 hours  Studies/Results: No results found.  Anti-infectives: Anti-infectives   Start     Dose/Rate Route Frequency Ordered Stop   10/28/13 0732  ceFAZolin (ANCEF) IVPB 2 g/50 mL premix     2 g 100 mL/hr over 30 Minutes Intravenous On call to O.R. 10/28/13 0732 10/28/13 1035      Assessment/Plan: s/p Procedure(s): LAPAROSCOPIC SPLENECTOMY (N/A)  D/c foley today Ambulate Advance diet   LOS: 2 days    Caidyn Blossom A 10/30/2013

## 2013-10-31 ENCOUNTER — Other Ambulatory Visit: Payer: Self-pay

## 2013-10-31 LAB — CBC
HEMATOCRIT: 33.6 % — AB (ref 36.0–46.0)
Hemoglobin: 11.3 g/dL — ABNORMAL LOW (ref 12.0–15.0)
MCH: 29.1 pg (ref 26.0–34.0)
MCHC: 33.6 g/dL (ref 30.0–36.0)
MCV: 86.6 fL (ref 78.0–100.0)
PLATELETS: 251 10*3/uL (ref 150–400)
RBC: 3.88 MIL/uL (ref 3.87–5.11)
RDW: 11.8 % (ref 11.5–15.5)
WBC: 8.7 10*3/uL (ref 4.0–10.5)

## 2013-10-31 MED ORDER — ONDANSETRON 4 MG PO TBDP: 4.0000 mg | ORAL_TABLET | Freq: Three times a day (TID) | ORAL | Status: DC | PRN

## 2013-10-31 MED ORDER — OXYCODONE-ACETAMINOPHEN 5-325 MG PO TABS: 1.0000 | ORAL_TABLET | ORAL | Status: DC | PRN

## 2013-10-31 NOTE — Discharge Summary (Signed)
Physician Discharge Summary  Patient ID: Dawn Boyd MRN: [id number] DOB/AGE: 44-Jun-1972 44 y.o.  Admit date: 10/28/2013 Discharge date: 10/31/2013  Admission Diagnoses:  Discharge Diagnoses:  Active Problems:   S/P splenectomy splenic artery aneurysm  Discharged Condition: good  Hospital Course: uneventful post op recovery.  Mild urinary retention resolved.  Discharged POD#3  Consults: None  Significant Diagnostic Studies:   Treatments: surgery: laparoscopic splenectomy  Discharge Exam: Blood pressure 149/84, pulse 68, temperature 99.1 F (37.3 C), temperature source Oral, resp. rate 18, height 5' 3"  (1.6 m), weight 116 lb 8 oz (52.844 kg), last menstrual period 10/31/2010, SpO2 100.00%. General appearance: alert and no distress Resp: clear to auscultation bilaterally Cardio: regular rate and rhythm, S1, S2 normal, no murmur, click, rub or gallop Incision/Wound: abdomen soft, incisions clean  Disposition: 01-Home or Self Care     Medication List         aspirin-acetaminophen-caffeine 861-683-72 MG per tablet  Commonly known as:  EXCEDRIN MIGRAINE  Take 0.5 tablets by mouth every 6 (six) hours as needed for headache.     oxyCODONE-acetaminophen 5-325 MG per tablet  Commonly known as:  PERCOCET/ROXICET  Take 1-2 tablets by mouth every 4 (four) hours as needed for moderate pain.     rizatriptan 10 MG tablet  Commonly known as:  MAXALT  Take 5 mg by mouth as needed. May repeat in 2 hours if needed, migraine           Follow-up Information   Follow up with [facility] A, MD. Schedule an appointment as soon as possible for a visit in 3 weeks.   Specialty:  General Surgery   Contact information:   [address] [place] [address] [phone]       Signed: Harl Bowie 10/31/2013, 8:05 AM

## 2013-10-31 NOTE — Progress Notes (Signed)
Patient verbalized understanding of discharge instructions. Patient is stable at discharge. Patient was given prescription and d/c forms.

## 2013-10-31 NOTE — Progress Notes (Signed)
Patient ID: Dawn Boyd, female   DOB: [birth year], 43 y.o.   MRN: [id number]  Doing well Voiding on own Abdomen soft  Discharge home

## 2013-10-31 NOTE — Discharge Instructions (Signed)
Ok to shower  Stool softener for constipation  No lifting more than 15 to 20 pounds from date of surgery

## 2013-12-15 ENCOUNTER — Other Ambulatory Visit: Payer: Self-pay | Admitting: Obstetrics and Gynecology

## 2013-12-17 ENCOUNTER — Other Ambulatory Visit: Payer: Self-pay | Admitting: Obstetrics and Gynecology

## 2013-12-17 DIAGNOSIS — Z1509 Genetic susceptibility to other malignant neoplasm: Principal | ICD-10-CM

## 2013-12-17 DIAGNOSIS — Z1501 Genetic susceptibility to malignant neoplasm of breast: Secondary | ICD-10-CM

## 2013-12-23 ENCOUNTER — Ambulatory Visit
Admission: RE | Admit: 2013-12-23 | Discharge: 2013-12-23 | Disposition: A | Discharge status: Home / Self Care | Payer: BC Managed Care – PPO | Source: Ambulatory Visit | Attending: Obstetrics and Gynecology | Admitting: Obstetrics and Gynecology

## 2013-12-23 DIAGNOSIS — Z1501 Genetic susceptibility to malignant neoplasm of breast: Secondary | ICD-10-CM

## 2013-12-23 DIAGNOSIS — Z1509 Genetic susceptibility to other malignant neoplasm: Principal | ICD-10-CM

## 2013-12-23 MED ORDER — GADOBENATE DIMEGLUMINE 529 MG/ML IV SOLN
10.0000 mL | Freq: Once | INTRAVENOUS | Status: AC | PRN
  Administered 2013-12-23: 10 mL via INTRAVENOUS

## 2014-02-05 ENCOUNTER — Telehealth: Payer: Self-pay | Admitting: *Deleted

## 2014-02-05 NOTE — Telephone Encounter (Signed)
Left message for pt to return my call so I can schedule her w/ a new med onc since Dr. Humphrey Rolls is no longer with [place].

## 2014-02-09 ENCOUNTER — Telehealth: Payer: Self-pay | Admitting: *Deleted

## 2014-02-09 NOTE — Telephone Encounter (Signed)
Called pt and confirmed 02/25/14 high risk appt w/ her.

## 2014-02-25 ENCOUNTER — Ambulatory Visit (HOSPITAL_BASED_OUTPATIENT_CLINIC_OR_DEPARTMENT_OTHER): Payer: BLUE CROSS/BLUE SHIELD | Admitting: Hematology

## 2014-02-25 ENCOUNTER — Encounter: Payer: Self-pay | Admitting: Hematology

## 2014-02-25 ENCOUNTER — Telehealth: Payer: Self-pay | Admitting: Hematology

## 2014-02-25 ENCOUNTER — Ambulatory Visit: Payer: BLUE CROSS/BLUE SHIELD

## 2014-02-25 VITALS — BP 121/73 | HR 70 | Temp 98.2°F | Resp 19 | Ht 63.0 in | Wt 117.3 lb

## 2014-02-25 DIAGNOSIS — Z1501 Genetic susceptibility to malignant neoplasm of breast: Secondary | ICD-10-CM

## 2014-02-25 DIAGNOSIS — Z1509 Genetic susceptibility to other malignant neoplasm: Principal | ICD-10-CM

## 2014-02-25 NOTE — Progress Notes (Signed)
[place]  [phone] [phone]  Clinic Follow up Note   Patient Care Team: Precious Reel, MD as PCP - General (Internal Medicine) 02/25/2014  DIAGNOSIS: 1. BRCA2 mutation carrier   PREVIOUS THERAPY 1. Prophylactic abdominal hysterectomy and bilateral salpingo-oophorectomy in 2012   INTERVAL HISTORY: Dawn Boyd is a 44 year old female, with BRCA2 gene mutation, returns for follow-up. She was previously seen by Dr. Humphrey Rolls in August 2013.  She feels well overall. She has been doing annually mammogram and breast MRI, which all have been negative. She underwent a prophylactic splenectomy in October 2015 due to aneurysm. She has good appetite and energy level, denies any complaints.  REVIEW OF SYSTEMS:   Constitutional: Denies fevers, chills or abnormal weight loss Eyes: Denies blurriness of vision Ears, nose, mouth, throat, and face: Denies mucositis or sore throat Respiratory: Denies cough, dyspnea or wheezes Cardiovascular: Denies palpitation, chest discomfort or lower extremity swelling Gastrointestinal:  Denies nausea, heartburn or change in bowel habits Skin: Denies abnormal skin rashes Lymphatics: Denies new lymphadenopathy or easy bruising Neurological:Denies numbness, tingling or new weaknesses Behavioral/Psych: Mood is stable, no new changes  All other systems were reviewed with the patient and are negative.  MEDICAL HISTORY:  Past Medical History  Diagnosis Date  . Seasonal allergies   . GERD (gastroesophageal reflux disease)     tums prn  . Blood transfusion     pt states age 14 in [place] had problems with blood clotting, no further problems  . PONV (postoperative nausea and vomiting)   . BRCA2 positive 09/08/2011    pt. has hx./ mother has hx. of ovarian cancer-no cancers for patient  . Blood transfusion without reported diagnosis   . Allergy   . Osteopenia     MILD  . Clotting disorder     THROMOCYTOPENIA  AGE 25  .  Depression     AFTER CHILD BIRTH  . Thrombosis of splenic artery anastomosis   . IBS (irritable bowel syndrome)   . Gilbert's syndrome     "Liver doesn't process bilirubin"  . Bronchitis   . Anxiety     situational  . Headache(784.0)     migraines-usually once weekly  . Blood dyscrasia     thrombocytopenia-age 40, resolved  . Voiding difficulty     history of post procedure difficulty voiding  . Heart murmur     no problems    SURGICAL HISTORY: Past Surgical History  Procedure Laterality Date  . Nasal septum repair  2007  . Laparoscopic assisted vaginal hysterectomy  11/22/2010    Procedure: LAPAROSCOPIC ASSISTED VAGINAL HYSTERECTOMY;  Surgeon: Arloa Koh;  Location: [facility];  Service: Gynecology;  Laterality: N/A;  . Salpingoophorectomy  11/22/2010    Procedure: SALPINGO OOPHERECTOMY;  Surgeon: Arloa Koh;  Location: [facility];  Service: Gynecology;  Laterality: Bilateral;  . Abdominal hysterectomy    . Radiology with anesthesia N/A 09/29/2013    Procedure: EMBOLIZATION ;  Surgeon: Azzie Roup, MD;  Location: [facility];  Service: Radiology;  Laterality: N/A;  . Laparoscopic splenectomy N/A 10/28/2013    Procedure: LAPAROSCOPIC SPLENECTOMY;  Surgeon: Coralie Keens, MD;  Location: [facility];  Service: General;  Laterality: N/A;    I have reviewed the social history and family history with the patient and they are unchanged from previous note.  ALLERGIES:  is allergic to lactose intolerance (gi) and tape.  MEDICATIONS:  Current Outpatient Prescriptions  Medication Sig Dispense Refill  . aspirin-acetaminophen-caffeine ([place]) [phone]  MG per tablet Take 0.5 tablets by mouth every 6 (six) hours as needed for headache.     . rizatriptan (MAXALT) 10 MG tablet Take 5 mg by mouth as needed. May repeat in 2 hours if needed, migraine    . amoxicillin (AMOXIL) 500 MG capsule Only takes prior to dental works.  0   No current facility-administered medications for this  visit.    PHYSICAL EXAMINATION: ECOG PERFORMANCE STATUS: 0 - Asymptomatic  Filed Vitals:   02/25/14 1439  BP: 121/73  Pulse: 70  Temp: 98.2 F (36.8 C)  Resp: 19   Filed Weights   02/25/14 1439  Weight: 117 lb 4.8 oz (53.207 kg)    GENERAL:alert, no distress and comfortable SKIN: skin color, texture, turgor are normal, no rashes or significant lesions EYES: normal, Conjunctiva are pink and non-injected, sclera clear OROPHARYNX:no exudate, no erythema and lips, buccal mucosa, and tongue normal  NECK: supple, thyroid normal size, non-tender, without nodularity LYMPH:  no palpable lymphadenopathy in the cervical, axillary or inguinal LUNGS: clear to auscultation and percussion with normal breathing effort HEART: regular rate & rhythm and no murmurs and no lower extremity edema ABDOMEN:abdomen soft, non-tender and normal bowel sounds Musculoskeletal:no cyanosis of digits and no clubbing  NEURO: alert & oriented x 3 with fluent speech, no focal motor/sensory deficits  LABORATORY DATA:  I have reviewed the data as listed CBC Latest Ref Rng 10/31/2013 10/29/2013 10/23/2013  WBC 4.0 - 10.5 K/uL 8.7 11.2(H) 4.3  Hemoglobin 12.0 - 15.0 g/dL 11.3(L) 11.2(L) 12.6  Hematocrit 36.0 - 46.0 % 33.6(L) 33.5(L) 37.3  Platelets 150 - 400 K/uL 251 219 186     CMP Latest Ref Rng 10/29/2013 10/23/2013 09/29/2013  Glucose 70 - 99 mg/dL 94 94 103(H)  BUN 6 - 23 mg/dL 11 15 13   Creatinine 0.50 - 1.10 mg/dL 0.89 0.82 0.84  Sodium 137 - 147 mEq/L 140 140 142  Potassium 3.7 - 5.3 mEq/L 4.2 4.7 3.9  Chloride 96 - 112 mEq/L 104 103 105  CO2 19 - 32 mEq/L 26 27 25   Calcium 8.4 - 10.5 mg/dL 8.6 9.6 9.5  Total Protein 6.0 - 8.3 g/dL - 7.4 -  Total Bilirubin 0.3 - 1.2 mg/dL - 1.5(H) -  Alkaline Phos 39 - 117 U/L - 89 -  AST 0 - 37 U/L - 18 -  ALT 0 - 35 U/L - 11 -      RADIOGRAPHIC STUDIES: I have personally reviewed the radiological images as listed and agreed with the findings in the  report.  Breast MRI 12/23/2013 No MRI evidence of malignancy.  ASSESSMENT & PLAN:  44 year old female BRCA 2 mutation carrier.   1. BRCA2 mutation carrier  -We discussed extensively about the risks of breast cancer (45-60% by age of 67) and ovarian cancer (11-18% by age of 55), and the risks of other malignancies, including pancreatic cancer, colorectal cancer, endometrial cancer, skin cancer, etc.  -She is now status post bilateral salpingo-oophorectomies. This certainly will help her reduce her risk of developing ovarian cancer as well as reduce her risk of breast cancer as well -She has been uncertain about having bilateral mastectomies, but is more open to the idea nail, and agrees to discuss with surgeon. I'll make a referral to [facility]. - We discussed extensively today overall surveillance with MRI and mammograms and self breast examinations and clinical examinations. -We also discussed chemoprevention with use of tamoxifen 20 mg daily to help prevent breast cancer risk.  The side effects of tamoxifen, which includes but done not limited to, hot flashes, vaginal dryness, small risk of thrombosis, small risk of endometrial cancer (does not apply to her due to hysterectomy), were discussed with her in great details. Written material off tamoxifen was given to her today. She understands the risks and benefits of tamoxifen. She will think about it and let me know   Plan: -Surgical referral to discuss prophylactic mastectomy -She will call me about her decision on tamoxifen -Continue annual mammogram and MRI screening -Return to clinic in 1 year.  Orders Placed This Encounter  Procedures  . MM Digital Diagnostic Bilat    Standing Status: Future     Number of Occurrences:      Standing Expiration Date: 02/25/2015    Scheduling Instructions:     Same place she had mammo before    Order Specific Question:  Reason for Exam (SYMPTOM  OR DIAGNOSIS REQUIRED)    Answer:  (+)  BRCA2 mutation, screening mammo    Order Specific Question:  Is the patient pregnant?    Answer:  No    Order Specific Question:  Preferred imaging location?    Answer:  External   All questions were answered. The patient knows to call the clinic with any problems, questions or concerns. No barriers to learning was detected. I spent 30 minutes counseling the patient face to face. The total time spent in the appointment was 40 minutes and more than 50% was on counseling and review of test results     Truitt Merle, MD 02/25/2014 3:59 PM

## 2014-02-25 NOTE — Progress Notes (Signed)
Checked in new pt with no financial concerns. Pt has Raquel's card for any billing questions or concerns.

## 2014-02-25 NOTE — Telephone Encounter (Signed)
Gave avs & calendar for February 2017.

## 2014-02-26 ENCOUNTER — Encounter: Payer: Self-pay | Admitting: Hematology

## 2014-02-26 NOTE — Addendum Note (Signed)
Addended by: Truitt Merle on: 02/26/2014 05:34 PM   Modules accepted: Orders

## 2014-03-04 ENCOUNTER — Telehealth: Payer: Self-pay | Admitting: Hematology

## 2014-03-04 NOTE — Telephone Encounter (Signed)
FAX OFFICE NOTES TO [facility] FAX NUMBER: [phone]

## 2014-03-25 ENCOUNTER — Telehealth: Payer: Self-pay | Admitting: Genetic Counselor

## 2014-03-25 NOTE — Telephone Encounter (Signed)
left message for patient regarding new patient appt for genetic counseling. Dx:  Genetic Testing Referring: Dr. Geoffry Paradise

## 2014-03-31 ENCOUNTER — Other Ambulatory Visit: Payer: Self-pay | Admitting: Obstetrics and Gynecology

## 2014-03-31 DIAGNOSIS — Z1501 Genetic susceptibility to malignant neoplasm of breast: Secondary | ICD-10-CM

## 2014-04-06 ENCOUNTER — Ambulatory Visit
Admission: RE | Admit: 2014-04-06 | Discharge: 2014-04-06 | Disposition: A | Discharge status: Home / Self Care | Payer: BLUE CROSS/BLUE SHIELD | Source: Ambulatory Visit | Attending: Obstetrics and Gynecology | Admitting: Obstetrics and Gynecology

## 2014-04-06 ENCOUNTER — Other Ambulatory Visit: Payer: Self-pay | Admitting: Obstetrics and Gynecology

## 2014-04-06 DIAGNOSIS — Z1501 Genetic susceptibility to malignant neoplasm of breast: Secondary | ICD-10-CM

## 2014-04-06 DIAGNOSIS — N6459 Other signs and symptoms in breast: Secondary | ICD-10-CM

## 2014-05-01 ENCOUNTER — Encounter: Payer: Self-pay | Admitting: *Deleted

## 2014-05-15 ENCOUNTER — Encounter (HOSPITAL_COMMUNITY): Payer: Self-pay

## 2014-05-15 ENCOUNTER — Emergency Department (INDEPENDENT_AMBULATORY_CARE_PROVIDER_SITE_OTHER)
Admission: EM | Admit: 2014-05-15 | Discharge: 2014-05-15 | Disposition: A | Discharge status: Home / Self Care | Payer: BLUE CROSS/BLUE SHIELD | Source: Home / Self Care | Attending: Family Medicine | Admitting: Family Medicine

## 2014-05-15 DIAGNOSIS — K2970 Gastritis, unspecified, without bleeding: Secondary | ICD-10-CM | POA: Diagnosis not present

## 2014-05-15 DIAGNOSIS — R208 Other disturbances of skin sensation: Secondary | ICD-10-CM | POA: Diagnosis not present

## 2014-05-15 DIAGNOSIS — L7682 Other postprocedural complications of skin and subcutaneous tissue: Secondary | ICD-10-CM

## 2014-05-15 LAB — POCT I-STAT, CHEM 8
BUN: 14 mg/dL (ref 6–23)
CALCIUM ION: 1.21 mmol/L (ref 1.12–1.23)
CHLORIDE: 104 mmol/L (ref 96–112)
CREATININE: 0.8 mg/dL (ref 0.50–1.10)
Glucose, Bld: 100 mg/dL — ABNORMAL HIGH (ref 70–99)
HEMATOCRIT: 41 % (ref 36.0–46.0)
Hemoglobin: 13.9 g/dL (ref 12.0–15.0)
Potassium: 4.4 mmol/L (ref 3.5–5.1)
Sodium: 140 mmol/L (ref 135–145)
TCO2: 24 mmol/L (ref 0–100)

## 2014-05-15 LAB — CBC WITH DIFFERENTIAL/PLATELET
BASOS PCT: 1 % (ref 0–1)
Basophils Absolute: 0 10*3/uL (ref 0.0–0.1)
EOS ABS: 0 10*3/uL (ref 0.0–0.7)
Eosinophils Relative: 1 % (ref 0–5)
HCT: 38.3 % (ref 36.0–46.0)
HEMOGLOBIN: 12.8 g/dL (ref 12.0–15.0)
Lymphocytes Relative: 38 % (ref 12–46)
Lymphs Abs: 2.4 10*3/uL (ref 0.7–4.0)
MCH: 29.9 pg (ref 26.0–34.0)
MCHC: 33.4 g/dL (ref 30.0–36.0)
MCV: 89.5 fL (ref 78.0–100.0)
MONO ABS: 0.5 10*3/uL (ref 0.1–1.0)
Monocytes Relative: 7 % (ref 3–12)
Neutro Abs: 3.4 10*3/uL (ref 1.7–7.7)
Neutrophils Relative %: 53 % (ref 43–77)
PLATELETS: 343 10*3/uL (ref 150–400)
RBC: 4.28 MIL/uL (ref 3.87–5.11)
RDW: 12.5 % (ref 11.5–15.5)
WBC: 6.3 10*3/uL (ref 4.0–10.5)

## 2014-05-15 LAB — POCT URINALYSIS DIP (DEVICE)
BILIRUBIN URINE: NEGATIVE
Glucose, UA: NEGATIVE mg/dL
HGB URINE DIPSTICK: NEGATIVE
Ketones, ur: NEGATIVE mg/dL
Leukocytes, UA: NEGATIVE
Nitrite: NEGATIVE
PROTEIN: NEGATIVE mg/dL
Specific Gravity, Urine: 1.01 (ref 1.005–1.030)
Urobilinogen, UA: 0.2 mg/dL (ref 0.0–1.0)
pH: 7 (ref 5.0–8.0)

## 2014-05-15 MED ORDER — HYDROCODONE-ACETAMINOPHEN 5-325 MG PO TABS: 1.0000 | ORAL_TABLET | Freq: Four times a day (QID) | ORAL | Status: DC | PRN

## 2014-05-15 MED ORDER — OMEPRAZOLE 20 MG PO CPDR: 20.0000 mg | DELAYED_RELEASE_CAPSULE | Freq: Every day | ORAL | Status: DC

## 2014-05-15 MED ORDER — NAPROXEN 500 MG PO TABS: 500.0000 mg | ORAL_TABLET | Freq: Two times a day (BID) | ORAL | Status: DC

## 2014-05-15 NOTE — Discharge Instructions (Signed)

## 2014-05-15 NOTE — ED Provider Notes (Signed)
CSN: [id number]     Arrival date & time 05/15/14  1203 History   First MD Initiated Contact with Patient 05/15/14 1402     Chief Complaint  Patient presents with  . Flank Pain   (Consider location/radiation/quality/duration/timing/severity/associated sxs/prior Treatment) HPI       44 year old female presents complaining of left-sided abdominal pain, flank pain, and urinary frequency. She has had pain in the area for incision never since she had a splenectomy splenic artery aneurysm in October of last year. The pain tends to come and go but it is much worse the past 2 days that has been previously. She has seen her surgeon about this in clinic, apparently they just keep telling her to wait for this to get better. Now the pain has become quite severe and she denies any redness, fever, chills, NVD, or any drainage from the area of the incision site. She also says that she always has a feeling of urinary frequency and urgency but it seems to have gotten slightly worse over the past she days and she wants to make sure she does not have a UTI. No dysuria or hematuria.  Past Medical History  Diagnosis Date  . Seasonal allergies   . GERD (gastroesophageal reflux disease)     tums prn  . Blood transfusion     pt states age 89 in [place] had problems with blood clotting, no further problems  . PONV (postoperative nausea and vomiting)   . BRCA2 positive 09/08/2011    pt. has hx./ mother has hx. of ovarian cancer-no cancers for patient  . Blood transfusion without reported diagnosis   . Allergy   . Osteopenia     MILD  . Clotting disorder     THROMOCYTOPENIA  AGE 50  . Depression     AFTER CHILD BIRTH  . Thrombosis of splenic artery anastomosis   . IBS (irritable bowel syndrome)   . Gilbert's syndrome     "Liver doesn't process bilirubin"  . Bronchitis   . Anxiety     situational  . Headache(784.0)     migraines-usually once weekly  . Blood dyscrasia     thrombocytopenia-age 38, resolved  .  Voiding difficulty     history of post procedure difficulty voiding  . Heart murmur     no problems  . Gallbladder polyp    Past Surgical History  Procedure Laterality Date  . Nasal septum repair  2007  . Laparoscopic assisted vaginal hysterectomy  11/22/2010    Procedure: LAPAROSCOPIC ASSISTED VAGINAL HYSTERECTOMY;  Surgeon: Arloa Koh;  Location: [facility];  Service: Gynecology;  Laterality: N/A;  . Salpingoophorectomy  11/22/2010    Procedure: SALPINGO OOPHERECTOMY;  Surgeon: Arloa Koh;  Location: [facility];  Service: Gynecology;  Laterality: Bilateral;  . Abdominal hysterectomy    . Radiology with anesthesia N/A 09/29/2013    Procedure: EMBOLIZATION ;  Surgeon: Azzie Roup, MD;  Location: [facility];  Service: Radiology;  Laterality: N/A;  . Laparoscopic splenectomy N/A 10/28/2013    Procedure: LAPAROSCOPIC SPLENECTOMY;  Surgeon: Coralie Keens, MD;  Location: [facility];  Service: General;  Laterality: N/A;   Family History  Problem Relation Age of Onset  . Colon cancer Neg Hx   . Esophageal cancer Neg Hx   . Pancreatic cancer Neg Hx   . Rectal cancer Neg Hx   . Hypertension Mother   . Cancer Mother    History  Substance Use Topics  . Smoking status: Former Smoker  Types: Cigars    Quit date: 10/23/1993  . Smokeless tobacco: Never Used     Comment: During college smoked cigars  . Alcohol Use: 4.2 oz/week    7 Cans of beer per week     Comment: social beer/ mixed drink, using less now   OB History    No data available     Review of Systems  Gastrointestinal: Positive for abdominal pain.  Genitourinary: Positive for urgency and frequency. Negative for dysuria and hematuria.  All other systems reviewed and are negative.   Allergies  Lactose intolerance (gi) and Tape  Home Medications   Prior to Admission medications   Medication Sig Start Date End Date Taking? Authorizing Provider  amoxicillin (AMOXIL) 500 MG capsule Only takes prior to dental works. 01/26/14    Historical Provider, MD  aspirin-acetaminophen-caffeine (EXCEDRIN MIGRAINE) [phone] MG per tablet Take 0.5 tablets by mouth every 6 (six) hours as needed for headache.     Historical Provider, MD  HYDROcodone-acetaminophen (NORCO) 5-325 MG per tablet Take 1 tablet by mouth every 6 (six) hours as needed for severe pain. 05/15/14   Freeman Caldron Dorothyann Mourer, PA-C  naproxen (NAPROSYN) 500 MG tablet Take 1 tablet (500 mg total) by mouth 2 (two) times daily. 05/15/14   Liam Graham, PA-C  omeprazole (PRILOSEC) 20 MG capsule Take 1 capsule (20 mg total) by mouth daily. 05/15/14   Liam Graham, PA-C  rizatriptan (MAXALT) 10 MG tablet Take 5 mg by mouth as needed. May repeat in 2 hours if needed, migraine    Historical Provider, MD   BP 157/95 mmHg  Pulse 78  Temp(Src) 98.6 F (37 C) (Oral)  Resp 16  SpO2 99%  LMP 10/31/2010 Physical Exam  Constitutional: She is oriented to person, place, and time. Vital signs are normal. She appears well-developed and well-nourished. No distress.  HENT:  Head: Normocephalic and atraumatic.  Pulmonary/Chest: Effort normal. No respiratory distress.  Abdominal: Normal appearance and bowel sounds are normal. There is no rebound, no guarding and no CVA tenderness.    Neurological: She is alert and oriented to person, place, and time. She has normal strength. Coordination normal.  Skin: Skin is warm and dry. No rash noted. She is not diaphoretic.  Psychiatric: She has a normal mood and affect. Judgment normal.  Nursing note and vitals reviewed.   ED Course  Procedures (including critical care time) Labs Review Labs Reviewed  POCT I-STAT, CHEM 8 - Abnormal; Notable for the following:    Glucose, Bld 100 (*)    All other components within normal limits  URINE CULTURE  CBC WITH DIFFERENTIAL/PLATELET  POCT URINALYSIS DIP (DEVICE)    Imaging Review No results found.   MDM   1. Pain at surgical incision   2. Gastritis    Her labs are normal. I called her  surgeon who recommended follow-up in clinic. If this gets worse she will go to the urgent clinic at her surgeon's office. Otherwise she has an appointment for the end of next week. I will give her ibuprofen and a few tablets of Norco for pain. Also she complained as she was leaving of increased belching, occasional reflux, and sour stomach. I will give her a prescription of omeprazole to take for this problem.  Meds ordered this encounter  Medications  . omeprazole (PRILOSEC) 20 MG capsule    Sig: Take 1 capsule (20 mg total) by mouth daily.    Dispense:  30 capsule    Refill:  2  . naproxen (NAPROSYN) 500 MG tablet    Sig: Take 1 tablet (500 mg total) by mouth 2 (two) times daily.    Dispense:  60 tablet    Refill:  0  . HYDROcodone-acetaminophen (NORCO) 5-325 MG per tablet    Sig: Take 1 tablet by mouth every 6 (six) hours as needed for severe pain.    Dispense:  10 tablet    Refill:  0      Liam Graham, PA-C 05/15/14 1730

## 2014-05-15 NOTE — ED Notes (Signed)
C/o left flank pain since yesterday, feels like prior UTI, pain comes and goes. Had spleenectomy , and pain is in her incisional area

## 2014-05-16 LAB — URINE CULTURE
Colony Count: NO GROWTH
Culture: NO GROWTH

## 2014-05-27 ENCOUNTER — Encounter: Payer: Self-pay | Admitting: Internal Medicine

## 2014-05-27 ENCOUNTER — Ambulatory Visit (INDEPENDENT_AMBULATORY_CARE_PROVIDER_SITE_OTHER): Payer: BLUE CROSS/BLUE SHIELD | Admitting: Internal Medicine

## 2014-05-27 VITALS — BP 110/66 | HR 80 | Ht 63.0 in | Wt 117.4 lb

## 2014-05-27 DIAGNOSIS — K219 Gastro-esophageal reflux disease without esophagitis: Secondary | ICD-10-CM | POA: Diagnosis not present

## 2014-05-27 DIAGNOSIS — Z9081 Acquired absence of spleen: Secondary | ICD-10-CM

## 2014-05-27 DIAGNOSIS — K589 Irritable bowel syndrome without diarrhea: Secondary | ICD-10-CM | POA: Diagnosis not present

## 2014-05-27 DIAGNOSIS — R198 Other specified symptoms and signs involving the digestive system and abdomen: Secondary | ICD-10-CM

## 2014-05-27 DIAGNOSIS — K824 Cholesterolosis of gallbladder: Secondary | ICD-10-CM

## 2014-05-27 DIAGNOSIS — R1912 Hyperactive bowel sounds: Secondary | ICD-10-CM | POA: Diagnosis not present

## 2014-05-27 MED ORDER — RANITIDINE HCL 150 MG PO TABS: 150.0000 mg | ORAL_TABLET | Freq: Every evening | ORAL | Status: DC | PRN

## 2014-05-27 NOTE — Patient Instructions (Signed)
We have sent the following medications to your pharmacy for you to pick up at your convenience: Ranitidine 150 mg every night as needed for GERD  Please purchase the following medications over the counter and take as directed: Lactaid tablets (when needed)  Please remember lactose avoidance.  Follow up with Dr Hilarie Fredrickson as needed  You have been scheduled for an abdominal ultrasound at [facility] (1st floor of hospital) on Wednesday 07/29/14 at 8:00 am. Please arrive 15 minutes prior to your appointment for registration. Make certain not to have anything to eat or drink 6 hours prior to your appointment. Should you need to reschedule your appointment, please contact radiology at [phone]. This test typically takes about 30 minutes to perform.

## 2014-05-27 NOTE — Progress Notes (Signed)
Subjective:    Patient ID: Dawn Boyd, female    DOB: December 12, 1970, 44 y.o.   MRN: [id number]  HPI Dawn Boyd is a 44 yo female with PMH of BRCA2+ mutation status post bilateral salpingo-oophorectomy currently undergoing surveillance for breast cancer, it was initially seen in July 2015 to evaluate fecal urgency intermittent rectal pain and rectal bleeding. After that visit she had a colonoscopy performed on 08/04/2013 which was normal. She also had a history of gallbladder polyp and abdominal ultrasound was ordered to follow-up. This study, performed in July 2015, showed 2 small gallbladder polyps measuring 3 mm and 1.2 mm. No other gallbladder abnormality. Finally due to her history of BRCA2 the decision was made to perform an abdominal CT scan to evaluate upper abdominal discomfort and also screen for pancreatic abnormality/mass. This showed no pancreatic abnormality but did show a 1.2 cm splenic artery aneurysm along the splenic hilum. She was seen by Dr. Kathlene Cote and the decision was made to attempt to perform coil embolization. This was not possible due to tortuosity of the vessel and eventually decision was made to undergo splenectomy performed by Dr. Ninfa Linden in October 2015.  The surgery was uneventful, but since this time she has had soreness occasionally near and just above the left middle to left lower quadrant surgical scar. She notices borborygmi particularly in this area if she does not eat on a regular basis. There is minimal pain associated with this scar. She is having occasional reflux which is noticed near the xiphoid particularly at night. Occasionally she feels this discomfort in her upper mid back. She uses Tums with success. She denies dysphagia or odynophagia. No nausea or vomiting. She does have a mild lactose intolerance and is using Lactaid tablets with some success. She reports now regular bowel movements without diarrhea or constipation. No further bleeding. She is active and  enjoys playing tennis.  Review of Systems As per history of present illness, otherwise negative  Current Medications, Allergies, Past Medical History, Past Surgical History, Family History and Social History were reviewed in [employer] record.     Objective:   Physical Exam BP 110/66 mmHg  Pulse 80  Ht 5' 3"  (1.6 m)  Wt 117 lb 6.4 oz (53.252 kg)  BMI 20.80 kg/m2  LMP 10/31/2010 Constitutional: Well-developed and well-nourished. No distress. HEENT: Normocephalic and atraumatic. Oropharynx is clear and moist. No oropharyngeal exudate. Conjunctivae are normal.  No scleral icterus. Neck: Neck supple. Trachea midline. Cardiovascular: Normal rate, regular rhythm and intact distal pulses. No M/R/G Pulmonary/chest: Effort normal and breath sounds normal. No wheezing, rales or rhonchi. Abdominal: Soft, nontender, nondistended. Bowel sounds active throughout. There are no masses palpable. No hepatosplenomegaly. Extremities: no clubbing, cyanosis, or edema Lymphadenopathy: No cervical adenopathy noted. Neurological: Alert and oriented to person place and time. Skin: Skin is warm and dry. No rashes noted. Psychiatric: Normal mood and affect. Behavior is normal.  CBC    Component Value Date/Time   WBC 6.3 05/15/2014 1424   WBC 4.5 09/30/2010 1328   RBC 4.28 05/15/2014 1424   RBC 4.24 09/30/2010 1328   HGB 12.8 05/15/2014 1424   HGB 12.8 09/30/2010 1328   HCT 38.3 05/15/2014 1424   HCT 36.3 09/30/2010 1328   PLT 343 05/15/2014 1424   PLT 183 09/30/2010 1328   MCV 89.5 05/15/2014 1424   MCV 85.6 09/30/2010 1328   MCH 29.9 05/15/2014 1424   MCH 30.2 09/30/2010 1328   MCHC 33.4 05/15/2014 1424  MCHC 35.3 09/30/2010 1328   RDW 12.5 05/15/2014 1424   RDW 11.7 09/30/2010 1328   LYMPHSABS 2.4 05/15/2014 1424   LYMPHSABS 1.8 09/30/2010 1328   MONOABS 0.5 05/15/2014 1424   MONOABS 0.3 09/30/2010 1328   EOSABS 0.0 05/15/2014 1424   EOSABS 0.1 09/30/2010 1328    BASOSABS 0.0 05/15/2014 1424   BASOSABS 0.0 09/30/2010 1328    CMP     Component Value Date/Time   NA 140 05/15/2014 1414   K 4.4 05/15/2014 1414   CL 104 05/15/2014 1414   CO2 26 10/29/2013 0500   GLUCOSE 100* 05/15/2014 1414   BUN 14 05/15/2014 1414   CREATININE 0.80 05/15/2014 1414   CALCIUM 8.6 10/29/2013 0500   PROT 7.4 10/23/2013 1055   ALBUMIN 4.4 10/23/2013 1055   AST 18 10/23/2013 1055   ALT 11 10/23/2013 1055   ALKPHOS 89 10/23/2013 1055   BILITOT 1.5* 10/23/2013 1055   GFRNONAA 78* 10/29/2013 0500   GFRAA >90 10/29/2013 0500   Abd [place], CT scan and CT-A reviewed    Assessment & Plan:  44 yo female with PMH of +BRCA gene mutation status post hysterectomy and bilateral oophorectomy, history of splenic artery aneurysm status post splenectomy, GERD who is seen in follow-up  1. GERD -- mild and intermittent symptoms, specifically at night. No alarm symptoms such as dysphagia or odynophagia. I have recommended ranitidine 150 mg daily at bedtime on an as-needed basis. She notices worsening symptoms with alcohol use and so I have advised that if she is going to be drinking alcohol in the evenings, eating a large or late meal, that she can take ranitidine prophylactically on these evenings. If symptoms progress, become more frequent, or change she is asked to notify me and she voices understanding.  2. Lactose intolerance with IBS -- mild, intermittent irritable symptoms. Lactose avoidance and over-the-counter Lactaid tablets when necessary  3. Gallbladder polyps -- small, repeat ultrasound July 2015 to be scheduled to assess for stability. If stable likely will not require repeat imaging  4. Mild elevation in bilirubin -- chronic and in the past mostly indirect likely related to Gilbert's disease  5. Splenic artery aneurysm -- status post splenectomy. Patient did receive vaccines prior to splenectomy  25 minutes spent with the patient today

## 2014-05-28 ENCOUNTER — Emergency Department (HOSPITAL_COMMUNITY)
Admission: EM | Admit: 2014-05-28 | Discharge: 2014-05-29 | Disposition: A | Discharge status: Home / Self Care | Payer: BLUE CROSS/BLUE SHIELD | Attending: Emergency Medicine | Admitting: Emergency Medicine

## 2014-05-28 ENCOUNTER — Encounter (HOSPITAL_COMMUNITY): Payer: Self-pay | Admitting: *Deleted

## 2014-05-28 DIAGNOSIS — B349 Viral infection, unspecified: Secondary | ICD-10-CM | POA: Insufficient documentation

## 2014-05-28 DIAGNOSIS — Z8639 Personal history of other endocrine, nutritional and metabolic disease: Secondary | ICD-10-CM | POA: Diagnosis not present

## 2014-05-28 DIAGNOSIS — K219 Gastro-esophageal reflux disease without esophagitis: Secondary | ICD-10-CM | POA: Insufficient documentation

## 2014-05-28 DIAGNOSIS — J029 Acute pharyngitis, unspecified: Secondary | ICD-10-CM | POA: Diagnosis present

## 2014-05-28 DIAGNOSIS — Z862 Personal history of diseases of the blood and blood-forming organs and certain disorders involving the immune mechanism: Secondary | ICD-10-CM | POA: Diagnosis not present

## 2014-05-28 DIAGNOSIS — R011 Cardiac murmur, unspecified: Secondary | ICD-10-CM | POA: Insufficient documentation

## 2014-05-28 DIAGNOSIS — Z8659 Personal history of other mental and behavioral disorders: Secondary | ICD-10-CM | POA: Diagnosis not present

## 2014-05-28 DIAGNOSIS — Z8739 Personal history of other diseases of the musculoskeletal system and connective tissue: Secondary | ICD-10-CM | POA: Diagnosis not present

## 2014-05-28 DIAGNOSIS — Z87891 Personal history of nicotine dependence: Secondary | ICD-10-CM | POA: Insufficient documentation

## 2014-05-28 NOTE — ED Notes (Signed)
Pt c/o worsening sore throat x 2 days. States that she took ibuprofen with no relief.

## 2014-05-29 LAB — RAPID STREP SCREEN (MED CTR MEBANE ONLY): STREPTOCOCCUS, GROUP A SCREEN (DIRECT): NEGATIVE

## 2014-05-29 NOTE — ED Provider Notes (Signed)
CSN: [id number]     Arrival date & time 05/28/14  2349 History  This chart was scribed for non-physician practitioner, Etta Quill, NP working with Julianne Rice, MD by Pricilla Loveless, ED scribe. This patient was seen in room TR05C/TR05C and the patient's care was started at 12:04 AM    Chief Complaint  Patient presents with  . Sore Throat     (Consider location/radiation/quality/duration/timing/severity/associated sxs/prior Treatment) Patient is a 44 y.o. female presenting with pharyngitis. The history is provided by the patient. No language interpreter was used.  Sore Throat This is a new problem. The current episode started 2 days ago. The problem occurs rarely. The problem has been gradually worsening. Nothing aggravates the symptoms. Nothing relieves the symptoms. Treatments tried: ibuprophen. The treatment provided no relief.    HPI Comments: Dawn Boyd is a 44 y.o. female with Hx of spleanectomy who presents to the Emergency Department complaining of a constant, severe sore throat beginning 2 days ago, that has worsened in the past 2 hours.  The patient lists nausea, light-headness, rhinorrhea, scratchy feeling in the throat, and difficulty swallowing as associated symptoms. She states that she took two ibuprofen PTA with no relief.  The patient denies fever and vomiting as associated symptoms.  Past Medical History  Diagnosis Date  . Seasonal allergies   . GERD (gastroesophageal reflux disease)     tums prn  . Blood transfusion     pt states age 7 in [place] had problems with blood clotting, no further problems  . PONV (postoperative nausea and vomiting)   . BRCA2 positive 09/08/2011    pt. has hx./ mother has hx. of ovarian cancer-no cancers for patient  . Blood transfusion without reported diagnosis   . Allergy   . Osteopenia     MILD  . Clotting disorder     THROMOCYTOPENIA  AGE 72  . Depression     AFTER CHILD BIRTH  . Thrombosis of splenic artery anastomosis   .  IBS (irritable bowel syndrome)   . Gilbert's syndrome     "Liver doesn't process bilirubin"  . Bronchitis   . Anxiety     situational  . Headache(784.0)     migraines-usually once weekly  . Blood dyscrasia     thrombocytopenia-age 42, resolved  . Voiding difficulty     history of post procedure difficulty voiding  . Heart murmur     no problems  . Gallbladder polyp    Past Surgical History  Procedure Laterality Date  . Nasal septum repair  2007  . Laparoscopic assisted vaginal hysterectomy  11/22/2010    Procedure: LAPAROSCOPIC ASSISTED VAGINAL HYSTERECTOMY;  Surgeon: Arloa Koh;  Location: [facility];  Service: Gynecology;  Laterality: N/A;  . Salpingoophorectomy  11/22/2010    Procedure: SALPINGO OOPHERECTOMY;  Surgeon: Arloa Koh;  Location: [facility];  Service: Gynecology;  Laterality: Bilateral;  . Abdominal hysterectomy    . Radiology with anesthesia N/A 09/29/2013    Procedure: EMBOLIZATION ;  Surgeon: Azzie Roup, MD;  Location: [facility];  Service: Radiology;  Laterality: N/A;  . Laparoscopic splenectomy N/A 10/28/2013    Procedure: LAPAROSCOPIC SPLENECTOMY;  Surgeon: Coralie Keens, MD;  Location: [facility];  Service: General;  Laterality: N/A;   Family History  Problem Relation Age of Onset  . Colon cancer Neg Hx   . Esophageal cancer Neg Hx   . Pancreatic cancer Neg Hx   . Rectal cancer Neg Hx   . Hypertension Mother   .  Cancer Mother    History  Substance Use Topics  . Smoking status: Former Smoker    Types: Cigars    Quit date: 10/23/1993  . Smokeless tobacco: Never Used     Comment: During college smoked cigars  . Alcohol Use: 4.2 oz/week    7 Cans of beer per week     Comment: social beer/ mixed drink, using less now   OB History    No data available     Review of Systems  Constitutional: Negative for fever.  HENT: Positive for congestion, rhinorrhea and sore throat.   Gastrointestinal: Positive for nausea. Negative for vomiting.  Neurological:  Positive for light-headedness.  All other systems reviewed and are negative.     Allergies  Lactose intolerance (gi) and Tape  Home Medications   Prior to Admission medications   Medication Sig Start Date End Date Taking? Authorizing Provider  aspirin-acetaminophen-caffeine (EXCEDRIN MIGRAINE) [phone] MG per tablet Take 0.5 tablets by mouth every 6 (six) hours as needed for headache.     Historical Provider, MD  ranitidine (ZANTAC) 150 MG tablet Take 1 tablet (150 mg total) by mouth at bedtime as needed for heartburn. 05/27/14   Jerene Bears, MD  rizatriptan (MAXALT) 10 MG tablet Take 5 mg by mouth as needed. May repeat in 2 hours if needed, migraine    Historical Provider, MD   BP 156/92 mmHg  Pulse 73  Temp(Src) 97.9 F (36.6 C) (Oral)  Resp 18  Ht 5' 3"  (1.6 m)  Wt 117 lb (53.071 kg)  BMI 20.73 kg/m2  SpO2 100%  LMP 10/31/2010  Physical Exam  Constitutional: She is oriented to person, place, and time. She appears well-developed and well-nourished. No distress.  HENT:  Head: Normocephalic and atraumatic.  Mouth/Throat: No oropharyngeal exudate.  Cobblestoning No exudate 1 white spot on left oral cavity   Eyes: Conjunctivae and EOM are normal.  Neck: Neck supple.  Cardiovascular: Normal rate, regular rhythm and normal heart sounds.   Pulmonary/Chest: Effort normal and breath sounds normal. No respiratory distress.  Neurological: She is alert and oriented to person, place, and time.  Skin: Skin is warm.  Psychiatric: Her behavior is normal.  Nursing note and vitals reviewed.   ED Course  Procedures (including critical care time) DIAGNOSTIC STUDIES: Oxygen Saturation is 100% on RA, Normal by my interpretation.    COORDINATION OF CARE: 12:08 AM Discussed treatment plan at bedside. Pt agreed to plan.  Labs Review Labs Reviewed  RAPID STREP SCREEN  CULTURE, GROUP A STREP    Imaging Review No results found.   EKG Interpretation None     Strep screen  negative.  Routine culture pending.  Discussed patient with Dr.Yelverton. Patient with history of splenectomy.  No indication of sepsis at present.   Pharyngitis is likely viral. MDM   Final diagnoses:  None  Pharyngitis with viral syndrome. Return precautions discussed. Follow-up with PCP.  I personally performed the services described in this documentation, which was scribed in my presence. The recorded information has been reviewed and is accurate.     Etta Quill, NP 05/29/14 0202  Julianne Rice, MD 05/29/14 [phone]

## 2014-05-29 NOTE — Discharge Instructions (Signed)
Pharyngitis Pharyngitis is redness, pain, and swelling (inflammation) of your pharynx.  CAUSES  Pharyngitis is usually caused by infection. Most of the time, these infections are from viruses (viral) and are part of a cold. However, sometimes pharyngitis is caused by bacteria (bacterial). Pharyngitis can also be caused by allergies. Viral pharyngitis may be spread from person to person by coughing, sneezing, and personal items or utensils (cups, forks, spoons, toothbrushes). Bacterial pharyngitis may be spread from person to person by more intimate contact, such as kissing.  SIGNS AND SYMPTOMS  Symptoms of pharyngitis include:   Sore throat.   Tiredness (fatigue).   Low-grade fever.   Headache.  Joint pain and muscle aches.  Skin rashes.  Swollen lymph nodes.  Plaque-like film on throat or tonsils (often seen with bacterial pharyngitis). DIAGNOSIS  Your health care provider will ask you questions about your illness and your symptoms. Your medical history, along with a physical exam, is often all that is needed to diagnose pharyngitis. Sometimes, a rapid strep test is done. Other lab tests may also be done, depending on the suspected cause.  TREATMENT  Viral pharyngitis will usually get better in 3-4 days without the use of medicine. Bacterial pharyngitis is treated with medicines that kill germs (antibiotics).  HOME CARE INSTRUCTIONS   Drink enough water and fluids to keep your urine clear or pale yellow.   Only take over-the-counter or prescription medicines as directed by your health care provider:   If you are prescribed antibiotics, make sure you finish them even if you start to feel better.   Do not take aspirin.   Get lots of rest.   Gargle with 8 oz of salt water ( tsp of salt per 1 qt of water) as often as every 1-2 hours to soothe your throat.   Throat lozenges (if you are not at risk for choking) or sprays may be used to soothe your throat. SEEK MEDICAL  CARE IF:   You have large, tender lumps in your neck.  You have a rash.  You cough up green, yellow-brown, or bloody spit. SEEK IMMEDIATE MEDICAL CARE IF:   Your neck becomes stiff.  You drool or are unable to swallow liquids.  You vomit or are unable to keep medicines or liquids down.  You have severe pain that does not go away with the use of recommended medicines.  You have trouble breathing (not caused by a stuffy nose). MAKE SURE YOU:   Understand these instructions.  Will watch your condition.  Will get help right away if you are not doing well or get worse. Document Released: 01/02/2005 Document Revised: 10/23/2012 Document Reviewed: 09/09/2012 [facility] Patient Information 2015 [place], [place]. This information is not intended to replace advice given to you by your health care provider. Make sure you discuss any questions you have with your health care provider.

## 2014-05-31 LAB — CULTURE, GROUP A STREP: Strep A Culture: NEGATIVE

## 2014-07-29 ENCOUNTER — Ambulatory Visit (HOSPITAL_COMMUNITY)
Admission: RE | Admit: 2014-07-29 | Discharge: 2014-07-29 | Disposition: A | Discharge status: Home / Self Care | Payer: 59 | Source: Ambulatory Visit | Attending: Internal Medicine | Admitting: Internal Medicine

## 2014-07-29 DIAGNOSIS — K824 Cholesterolosis of gallbladder: Secondary | ICD-10-CM | POA: Insufficient documentation

## 2014-07-29 DIAGNOSIS — K589 Irritable bowel syndrome without diarrhea: Secondary | ICD-10-CM

## 2014-07-29 DIAGNOSIS — R198 Other specified symptoms and signs involving the digestive system and abdomen: Secondary | ICD-10-CM

## 2014-07-29 DIAGNOSIS — K219 Gastro-esophageal reflux disease without esophagitis: Secondary | ICD-10-CM

## 2014-07-30 ENCOUNTER — Other Ambulatory Visit: Payer: Self-pay | Admitting: Obstetrics and Gynecology

## 2014-07-31 LAB — CYTOLOGY - PAP

## 2014-08-05 ENCOUNTER — Ambulatory Visit
Admission: RE | Admit: 2014-08-05 | Discharge: 2014-08-05 | Disposition: A | Discharge status: Home / Self Care | Payer: 59 | Source: Ambulatory Visit | Attending: Hematology | Admitting: Hematology

## 2014-08-05 ENCOUNTER — Other Ambulatory Visit: Payer: Self-pay | Admitting: Hematology

## 2014-08-05 DIAGNOSIS — Z1501 Genetic susceptibility to malignant neoplasm of breast: Secondary | ICD-10-CM

## 2014-08-05 DIAGNOSIS — Z1509 Genetic susceptibility to other malignant neoplasm: Principal | ICD-10-CM

## 2015-02-08 ENCOUNTER — Other Ambulatory Visit: Payer: Self-pay | Admitting: Obstetrics and Gynecology

## 2015-02-08 DIAGNOSIS — Z1501 Genetic susceptibility to malignant neoplasm of breast: Secondary | ICD-10-CM

## 2015-02-08 DIAGNOSIS — Z1509 Genetic susceptibility to other malignant neoplasm: Principal | ICD-10-CM

## 2015-02-15 ENCOUNTER — Ambulatory Visit
Admission: RE | Admit: 2015-02-15 | Discharge: 2015-02-15 | Disposition: A | Discharge status: Home / Self Care | Payer: 59 | Source: Ambulatory Visit | Attending: Obstetrics and Gynecology | Admitting: Obstetrics and Gynecology

## 2015-02-15 DIAGNOSIS — Z1501 Genetic susceptibility to malignant neoplasm of breast: Secondary | ICD-10-CM

## 2015-02-15 DIAGNOSIS — Z1509 Genetic susceptibility to other malignant neoplasm: Principal | ICD-10-CM

## 2015-02-18 ENCOUNTER — Telehealth: Payer: Self-pay | Admitting: Hematology

## 2015-02-18 ENCOUNTER — Ambulatory Visit
Admission: RE | Admit: 2015-02-18 | Discharge: 2015-02-18 | Disposition: A | Discharge status: Home / Self Care | Payer: 59 | Source: Ambulatory Visit | Attending: Obstetrics and Gynecology | Admitting: Obstetrics and Gynecology

## 2015-02-18 DIAGNOSIS — Z1509 Genetic susceptibility to other malignant neoplasm: Principal | ICD-10-CM

## 2015-02-18 DIAGNOSIS — Z1501 Genetic susceptibility to malignant neoplasm of breast: Secondary | ICD-10-CM

## 2015-02-18 MED ORDER — GADOBENATE DIMEGLUMINE 529 MG/ML IV SOLN
10.0000 mL | Freq: Once | INTRAVENOUS | Status: AC | PRN
  Administered 2015-02-18: 10 mL via INTRAVENOUS

## 2015-02-18 NOTE — Telephone Encounter (Signed)
S/w pt, confirmed appt for 2/7 @ 9.15am.

## 2015-02-22 ENCOUNTER — Other Ambulatory Visit: Payer: Self-pay | Admitting: *Deleted

## 2015-02-22 DIAGNOSIS — Z1501 Genetic susceptibility to malignant neoplasm of breast: Secondary | ICD-10-CM

## 2015-02-23 ENCOUNTER — Ambulatory Visit (HOSPITAL_BASED_OUTPATIENT_CLINIC_OR_DEPARTMENT_OTHER): Payer: 59 | Admitting: Hematology

## 2015-02-23 ENCOUNTER — Encounter: Payer: Self-pay | Admitting: Hematology

## 2015-02-23 ENCOUNTER — Other Ambulatory Visit (HOSPITAL_BASED_OUTPATIENT_CLINIC_OR_DEPARTMENT_OTHER): Payer: 59

## 2015-02-23 VITALS — BP 114/70 | HR 61 | Temp 97.9°F | Resp 17 | Ht 63.0 in | Wt 120.7 lb

## 2015-02-23 DIAGNOSIS — Z1501 Genetic susceptibility to malignant neoplasm of breast: Secondary | ICD-10-CM | POA: Diagnosis not present

## 2015-02-23 DIAGNOSIS — Z1509 Genetic susceptibility to other malignant neoplasm: Principal | ICD-10-CM

## 2015-02-23 DIAGNOSIS — Z1231 Encounter for screening mammogram for malignant neoplasm of breast: Secondary | ICD-10-CM

## 2015-02-23 LAB — CBC WITH DIFFERENTIAL/PLATELET
BASO%: 0.7 % (ref 0.0–2.0)
BASOS ABS: 0 10*3/uL (ref 0.0–0.1)
EOS ABS: 0.1 10*3/uL (ref 0.0–0.5)
EOS%: 2.4 % (ref 0.0–7.0)
HCT: 36.3 % (ref 34.8–46.6)
HEMOGLOBIN: 12.2 g/dL (ref 11.6–15.9)
LYMPH#: 2.7 10*3/uL (ref 0.9–3.3)
LYMPH%: 45.6 % (ref 14.0–49.7)
MCH: 30.1 pg (ref 25.1–34.0)
MCHC: 33.6 g/dL (ref 31.5–36.0)
MCV: 89.6 fL (ref 79.5–101.0)
MONO#: 0.6 10*3/uL (ref 0.1–0.9)
MONO%: 9.6 % (ref 0.0–14.0)
NEUT#: 2.4 10*3/uL (ref 1.5–6.5)
NEUT%: 41.7 % (ref 38.4–76.8)
Platelets: 293 10*3/uL (ref 145–400)
RBC: 4.05 10*6/uL (ref 3.70–5.45)
RDW: 13 % (ref 11.2–14.5)
WBC: 5.8 10*3/uL (ref 3.9–10.3)

## 2015-02-23 LAB — COMPREHENSIVE METABOLIC PANEL
ALBUMIN: 3.9 g/dL (ref 3.5–5.0)
ALT: 22 U/L (ref 0–55)
AST: 24 U/L (ref 5–34)
Alkaline Phosphatase: 93 U/L (ref 40–150)
Anion Gap: 9 mEq/L (ref 3–11)
BILIRUBIN TOTAL: 1.58 mg/dL — AB (ref 0.20–1.20)
BUN: 15.9 mg/dL (ref 7.0–26.0)
CALCIUM: 9.4 mg/dL (ref 8.4–10.4)
CHLORIDE: 108 meq/L (ref 98–109)
CO2: 24 meq/L (ref 22–29)
CREATININE: 0.9 mg/dL (ref 0.6–1.1)
EGFR: 83 mL/min/{1.73_m2} — ABNORMAL LOW (ref >90)
Glucose: 55 mg/dl — ABNORMAL LOW (ref 70–140)
Potassium: 4.2 mEq/L (ref 3.5–5.1)
Sodium: 141 mEq/L (ref 136–145)
Total Protein: 6.8 g/dL (ref 6.4–8.3)

## 2015-02-23 NOTE — Progress Notes (Signed)
[facility]  Telephone:([phone]) [phone] [phone]) [phone]  Clinic Follow up Note   Patient Care Team: Shon Baton, MD as PCP - General (Internal Medicine) 02/23/2015  DIAGNOSIS: 1. BRCA2 mutation carrier   PREVIOUS THERAPY 1. Prophylactic abdominal hysterectomy and bilateral salpingo-oophorectomy in 2012   INTERVAL HISTORY: Mrs. Gamboa is a 45 year old female, with BRCA2 gene mutation, returns for follow-up. She was last seen by me one year ago.   She is doing well overall, denies any pain, or other symptoms. She has good appetite and energy level, her weight is been stable. She did see a Psychologist, sport and exercise at [facility], and discussed prophylactic bilateral mastectomy. She has not decided about surgery yet.  REVIEW OF SYSTEMS:   Constitutional: Denies fevers, chills or abnormal weight loss Eyes: Denies blurriness of vision Ears, nose, mouth, throat, and face: Denies mucositis or sore throat Respiratory: Denies cough, dyspnea or wheezes Cardiovascular: Denies palpitation, chest discomfort or lower extremity swelling Gastrointestinal:  Denies nausea, heartburn or change in bowel habits Skin: Denies abnormal skin rashes Lymphatics: Denies new lymphadenopathy or easy bruising Neurological:Denies numbness, tingling or new weaknesses Behavioral/Psych: Mood is stable, no new changes  All other systems were reviewed with the patient and are negative.  MEDICAL HISTORY:  Past Medical History  Diagnosis Date  . Seasonal allergies   . GERD (gastroesophageal reflux disease)     tums prn  . Blood transfusion     pt states age 33 in [place] had problems with blood clotting, no further problems  . PONV (postoperative nausea and vomiting)   . BRCA2 positive 09/08/2011    pt. has hx./ mother has hx. of ovarian cancer-no cancers for patient  . Blood transfusion without reported diagnosis   . Allergy   . Osteopenia     MILD  . Clotting disorder ([facility])     THROMOCYTOPENIA   AGE 33  . Depression     AFTER CHILD BIRTH  . 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 IBS (irritable bowel syndrome)   . Gilbert's syndrome     "Liver doesn't process bilirubin"  . Bronchitis   . Anxiety     situational  . Headache(784.0)     migraines-usually once weekly  . Blood dyscrasia     thrombocytopenia-age 49, resolved  . Voiding difficulty     history of post procedure difficulty voiding  . Heart murmur     no problems  . Gallbladder polyp     SURGICAL HISTORY: Past Surgical History  Procedure Laterality Date  . Nasal septum repair  2007  . Laparoscopic assisted vaginal hysterectomy  11/22/2010    Procedure: LAPAROSCOPIC ASSISTED VAGINAL HYSTERECTOMY;  Surgeon: Arloa Koh;  Location: [facility];  Service: Gynecology;  Laterality: N/A;  . Salpingoophorectomy  11/22/2010    Procedure: SALPINGO OOPHERECTOMY;  Surgeon: Arloa Koh;  Location: [facility];  Service: Gynecology;  Laterality: Bilateral;  . Abdominal hysterectomy    . Radiology with anesthesia N/A 09/29/2013    Procedure: EMBOLIZATION ;  Surgeon: Azzie Roup, MD;  Location: [facility];  Service: Radiology;  Laterality: N/A;  . Laparoscopic splenectomy N/A 10/28/2013    Procedure: LAPAROSCOPIC SPLENECTOMY;  Surgeon: Coralie Keens, MD;  Location: [facility];  Service: General;  Laterality: N/A;    I have reviewed the social history and family history with the patient and they are unchanged from previous note.  ALLERGIES:  is allergic to lactose intolerance (gi) and tape.  MEDICATIONS:  Current  Outpatient Prescriptions  Medication Sig Dispense Refill  . aspirin-acetaminophen-caffeine (EXCEDRIN MIGRAINE) 250-250-65 MG per tablet Take 0.5 tablets by mouth every 6 (six) hours as needed for headache.     . rizatriptan (MAXALT) 10 MG tablet Take 5 mg by mouth as needed. May repeat in 2 hours if needed, migraine     No current facility-administered medications for this visit.    PHYSICAL EXAMINATION: ECOG  PERFORMANCE STATUS: 0 - Asymptomatic  Filed Vitals:   02/23/15 1032  BP: 114/70  Pulse: 61  Temp: 97.9 F (36.6 C)  Resp: 17   Filed Weights   02/23/15 1032  Weight: 120 lb 11.2 oz (54.749 kg)    GENERAL:alert, no distress and comfortable SKIN: skin color, texture, turgor are normal, no rashes or significant lesions EYES: normal, Conjunctiva are pink and non-injected, sclera clear OROPHARYNX:no exudate, no erythema and lips, buccal mucosa, and tongue normal  NECK: supple, thyroid normal size, non-tender, without nodularity LYMPH:  no palpable lymphadenopathy in the cervical, axillary or inguinal LUNGS: clear to auscultation and percussion with normal breathing effort HEART: regular rate & rhythm and no murmurs and no lower extremity edema ABDOMEN:abdomen soft, non-tender and normal bowel sounds Musculoskeletal:no cyanosis of digits and no clubbing  NEURO: alert & oriented x 3 with fluent speech, no focal motor/sensory deficits Breasts: Breast inspection showed them to be symmetrical with no nipple discharge. (+) Nipple inversion in right breast. Palpation of the breasts and axilla revealed no obvious mass that I could appreciate.   LABORATORY DATA:  I have reviewed the data as listed CBC Latest Ref Rng 02/23/2015 05/15/2014 05/15/2014  WBC 3.9 - 10.3 10e3/uL 5.8 6.3 -  Hemoglobin 11.6 - 15.9 g/dL 12.2 12.8 13.9  Hematocrit 34.8 - 46.6 % 36.3 38.3 41.0  Platelets 145 - 400 10e3/uL 293 343 -     CMP Latest Ref Rng 02/23/2015 05/15/2014 10/29/2013  Glucose 70 - 140 mg/dl 55(L) 100(H) 94  BUN 7.0 - 26.0 mg/dL 15.9 14 11   Creatinine 0.6 - 1.1 mg/dL 0.9 0.80 0.89  Sodium 136 - 145 mEq/L 141 140 140  Potassium 3.5 - 5.1 mEq/L 4.2 4.4 4.2  Chloride 96 - 112 mmol/L - 104 104  CO2 22 - 29 mEq/L 24 - 26  Calcium 8.4 - 10.4 mg/dL 9.4 - 8.6  Total Protein 6.4 - 8.3 g/dL 6.8 - -  Total Bilirubin 0.20 - 1.20 mg/dL 1.58(H) - -  Alkaline Phos 40 - 150 U/L 93 - -  AST 5 - 34 U/L 24 - -    ALT 0 - 55 U/L 22 - -      RADIOGRAPHIC STUDIES: I have personally reviewed the radiological images as listed and agreed with the findings in the report.  Breast MRI 02/18/2015 IMPRESSION: No MRI evidence of malignancy.  No definitive cause for the right nipple inversion is identified. The nipple inversion has been previously evaluated.  RECOMMENDATION: Annual screening mammography and annual breast MRI. Ideally, the mammography and breast MRI would be staggered at 6 month intervals.  MM diag breast b/l 02/15/2015  IMPRESSION: Stable breast parenchymal pattern. No findings worrisome for developing malignancy.  RECOMMENDATION: Annual screening mammography. Annual screening breast MRI.  ASSESSMENT & PLAN:  45 year old female BRCA 2 mutation carrier.   1. BRCA2 mutation carrier  -We previously discussed extensively about the risks of breast cancer (45-60% by age of 59) and ovarian cancer (11-18% by age of 42), and the risks of other malignancies, including pancreatic cancer, colorectal cancer, endometrial  cancer, skin cancer, etc.  -She is now status post bilateral salpingo-oophorectomies. This certainly will help her reduce her risk of developing ovarian cancer as well as reduce her risk of breast cancer as well -She has been uncertain about having bilateral mastectomies, has discussed with surgeon Dr. Rush Farmer. She wants to hold on the surgery for now -She declined chemoprevention with tamoxifen due to concern of medication side effects. -She will continue close screening with annual 3-D mammogram and MRI breast. I recommend her to have wanted scan every 6 months, we'll try to get her insurance approval for both images at 6 month interval -continue annual follow-up with [place], she will also see her primary care physician and gynecologist.  Plan: -3-D mammogram in 6 months, and a breast MRI in 1 year -I'll see her back in 1 year with lab.  All questions were answered. The  patient knows to call the clinic with any problems, questions or concerns. No barriers to learning was detected. I spent 20 minutes counseling the patient face to face. The total time spent in the appointment was 25 minutes and more than 50% was on counseling and review of test results     Truitt Merle, MD 02/23/2015 11:04 AM

## 2015-02-24 ENCOUNTER — Other Ambulatory Visit: Payer: BLUE CROSS/BLUE SHIELD

## 2015-02-24 ENCOUNTER — Other Ambulatory Visit: Payer: Self-pay

## 2015-02-24 ENCOUNTER — Telehealth: Payer: Self-pay | Admitting: Hematology

## 2015-02-24 ENCOUNTER — Ambulatory Visit: Payer: BLUE CROSS/BLUE SHIELD | Admitting: Hematology

## 2015-02-24 NOTE — Addendum Note (Signed)
Addended by: Truitt Merle on: 02/24/2015 10:36 AM   Modules accepted: Orders

## 2015-02-24 NOTE — Telephone Encounter (Signed)
per pof to sch pt appt-sch and sent Dr Burr Medico email per p [facility] that diagnosis need to be chged -willsch and call pt after reply

## 2015-04-15 ENCOUNTER — Emergency Department (HOSPITAL_COMMUNITY)
Admission: EM | Admit: 2015-04-15 | Discharge: 2015-04-15 | Disposition: A | Discharge status: Home / Self Care | Payer: 59 | Attending: Emergency Medicine | Admitting: Emergency Medicine

## 2015-04-15 ENCOUNTER — Encounter (HOSPITAL_COMMUNITY): Payer: Self-pay

## 2015-04-15 DIAGNOSIS — R011 Cardiac murmur, unspecified: Secondary | ICD-10-CM | POA: Insufficient documentation

## 2015-04-15 DIAGNOSIS — T85898A Other specified complication of other internal prosthetic devices, implants and grafts, initial encounter: Secondary | ICD-10-CM | POA: Insufficient documentation

## 2015-04-15 DIAGNOSIS — Y658 Other specified misadventures during surgical and medical care: Secondary | ICD-10-CM | POA: Diagnosis not present

## 2015-04-15 NOTE — ED Notes (Signed)
Pt states she was able to get into the dentist office that did her procedure and is leaving to go there. Ambulatory without distress.

## 2015-04-15 NOTE — ED Notes (Signed)
Patient had 2 teeth extracted and 2 implants yesterday. Notice yellowing of gum and swelling, has not heard from MD

## 2015-06-07 ENCOUNTER — Telehealth: Payer: Self-pay | Admitting: Internal Medicine

## 2015-06-07 NOTE — Telephone Encounter (Signed)
Pt states she has noticed that for the past 4-5 months she passes some mucous after her bowel movements. States she has not noticed any blood in the stool. States the bottom of her stomach hurts quite a bit, cramping right before she has BM. Pt wants to know if she should be concerned or needs to do anything due to the mucous. Please advise.

## 2015-06-07 NOTE — Telephone Encounter (Signed)
She had previously tried Levsin for lower abdominal cramping. Symptoms likely secondary to irritable bowel. Levsin 0.125 mg 1-2 tablets some bleeding will continue use every 4-6 hours as needed for lower abdominal cramping Over-the-counter probiotic such as align or Florastor per box instructions may help with regularity and mucus in her stools Mucus can be seen in irritable bowel and is not by itself concerning Office follow-up if not improving

## 2015-06-08 MED ORDER — HYOSCYAMINE SULFATE 0.125 MG SL SUBL: SUBLINGUAL_TABLET | SUBLINGUAL | Status: DC

## 2015-06-08 NOTE — Telephone Encounter (Signed)
Spoke with pt and she is aware. Script sent to pharmacy. Pt knows to call back if no improvement.

## 2015-07-24 ENCOUNTER — Other Ambulatory Visit: Payer: Self-pay | Admitting: Specialist

## 2015-07-24 DIAGNOSIS — R51 Headache: Principal | ICD-10-CM

## 2015-07-24 DIAGNOSIS — R519 Headache, unspecified: Secondary | ICD-10-CM

## 2015-08-23 ENCOUNTER — Ambulatory Visit
Admission: RE | Admit: 2015-08-23 | Discharge: 2015-08-23 | Disposition: A | Discharge status: Home / Self Care | Payer: 59 | Source: Ambulatory Visit | Attending: Specialist | Admitting: Specialist

## 2015-08-23 DIAGNOSIS — R51 Headache: Principal | ICD-10-CM

## 2015-08-23 DIAGNOSIS — R519 Headache, unspecified: Secondary | ICD-10-CM

## 2015-08-26 ENCOUNTER — Telehealth: Payer: Self-pay | Admitting: Internal Medicine

## 2015-08-26 NOTE — Telephone Encounter (Signed)
Pt states she was calling to schedule her [place] of abd that is done yearly. Per last year report it does not look like she needs another [place]. Dr. Hilarie Fredrickson please advise.  Notes Recorded by Jerene Bears, MD on 07/29/2014 at 1:59 PM Stable very small gallbladder polyps without significant change Very small size and stability argue against the need for cholecystectomy No further surveillance of these gallbladder polyps felt necessary at this time

## 2015-08-30 NOTE — Telephone Encounter (Signed)
The pt was notified that no further imaging is needed and she will call if she has any further concerns or GI issues.

## 2015-08-30 NOTE — Telephone Encounter (Signed)
Guidelines do not recommend surveillance for small and stable gallbladder polyps. These are felt to be benign and given stability are felt to pose no longer term risk For this reason surveillance [place] is not necessary Please let me know if she is uncomfortable or needs for explanation for this recommendation

## 2015-09-23 ENCOUNTER — Other Ambulatory Visit: Payer: Self-pay | Admitting: Obstetrics and Gynecology

## 2015-09-23 DIAGNOSIS — Z1501 Genetic susceptibility to malignant neoplasm of breast: Secondary | ICD-10-CM

## 2015-12-14 ENCOUNTER — Encounter: Payer: Self-pay | Admitting: *Deleted

## 2016-01-06 ENCOUNTER — Other Ambulatory Visit: Payer: Self-pay | Admitting: Obstetrics and Gynecology

## 2016-01-06 DIAGNOSIS — Z1501 Genetic susceptibility to malignant neoplasm of breast: Secondary | ICD-10-CM

## 2016-01-06 DIAGNOSIS — Z1509 Genetic susceptibility to other malignant neoplasm: Principal | ICD-10-CM

## 2016-01-21 ENCOUNTER — Ambulatory Visit (INDEPENDENT_AMBULATORY_CARE_PROVIDER_SITE_OTHER): Payer: 59 | Admitting: Internal Medicine

## 2016-01-21 ENCOUNTER — Encounter: Payer: Self-pay | Admitting: Internal Medicine

## 2016-01-21 VITALS — BP 116/72 | HR 78 | Ht 63.0 in | Wt 119.5 lb

## 2016-01-21 DIAGNOSIS — Z1509 Genetic susceptibility to other malignant neoplasm: Secondary | ICD-10-CM

## 2016-01-21 DIAGNOSIS — K589 Irritable bowel syndrome without diarrhea: Secondary | ICD-10-CM | POA: Diagnosis not present

## 2016-01-21 DIAGNOSIS — R195 Other fecal abnormalities: Secondary | ICD-10-CM

## 2016-01-21 DIAGNOSIS — R1032 Left lower quadrant pain: Secondary | ICD-10-CM

## 2016-01-21 DIAGNOSIS — K824 Cholesterolosis of gallbladder: Secondary | ICD-10-CM

## 2016-01-21 DIAGNOSIS — R1031 Right lower quadrant pain: Secondary | ICD-10-CM | POA: Diagnosis not present

## 2016-01-21 NOTE — Progress Notes (Signed)
Subjective:    Patient ID: Dawn Boyd, female    DOB: [birth year], 46 y.o.   MRN: [id number]  HPI Dawn Boyd is a 46 year old female with a past medical history of IBS, GERD, BRCA2 gene mutation status post prophylactic abdominal hysterectomy and bilateral salpingo-oophorectomy in 2012 with pending bilateral prophylactic mastectomy who is here for follow-up. She was seen last in our office in May 2016.  She reports that she's been having more issues with intermittent loose stools with mucus and lower abdominal cramping. This she refers to as a "sensitive stomach". It seems to come and go and is worse when she travels or drinks alcohol. There are times when her stools are regular and formed for weeks at a time. She has not seen blood in her stool or melena. She does report frequent abdominal bloating after eating particularly when her stools are loose. She is eating Kefir daily and also using activity or yogurt which she reports helps her tremendously. She has intermittent issues with reflux but this is rare. She denies dysphagia and odynophagia. Occasionally she feels upper abdominal pain in both the right and left upper quadrant which comes and goes and doesn't always relate to eating.  She notes that after splenectomy she takes antibiotics occasionally for prophylaxis. These always give her diarrhea.  She has upcoming bilateral prophylactic mastectomy given her history of the BRCA2 gene mutation. 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  We reviewed her family history and while there is a family history of malignancy there is no known family history of colorectal cancer. She had a colonoscopy performed in July 2015 which was normal.   Review of Systems  As per history of present illness, otherwise negative  Current Medications, Allergies, Past Medical History, Past Surgical History, Family History and Social History  were reviewed in [employer] record.     Objective:   Physical Exam BP 116/72   Pulse 78   Ht 5' 3" (1.6 m)   Wt 119 lb 8 oz (54.2 kg)   LMP 10/31/2010   BMI 21.17 kg/m  Constitutional: Well-developed and well-nourished. No distress. HEENT: Normocephalic and atraumatic.  Conjunctivae are normal.  No scleral icterus. Neck: Neck supple. Trachea midline. Cardiovascular: Normal rate, regular rhythm and intact distal pulses. No M/R/G Pulmonary/chest: Effort normal and breath sounds normal. No wheezing, rales or rhonchi. Abdominal: Soft, nontender, nondistended. Bowel sounds active throughout. There are no masses palpable. No hepatomegaly. Extremities: no clubbing, cyanosis, or edema Neurological: Alert and oriented to person place and time. Skin: Skin is warm and dry. No rashes noted. Psychiatric: Normal mood and affect. Behavior is normal.  CBC    Component Value Date/Time   WBC 5.8 02/23/2015 0957   WBC 6.3 05/15/2014 1424   RBC 4.05 02/23/2015 0957   RBC 4.28 05/15/2014 1424   HGB 12.2 02/23/2015 0957   HCT 36.3 02/23/2015 0957   PLT 293 02/23/2015 0957   MCV 89.6 02/23/2015 0957   MCH 30.1 02/23/2015 0957   MCH 29.9 05/15/2014 1424   MCHC 33.6 02/23/2015 0957   MCHC 33.4 05/15/2014 1424   RDW 13.0 02/23/2015 0957   LYMPHSABS 2.7 02/23/2015 0957   MONOABS 0.6 02/23/2015 0957   EOSABS 0.1 02/23/2015 0957   BASOSABS 0.0 02/23/2015 0957   CMP     Component Value Date/Time   NA 141 02/23/2015 0957   K 4.2 02/23/2015 0957   CL 104  05/15/2014 1414   CO2 24 02/23/2015 0957   GLUCOSE 55 (L) 02/23/2015 0957   BUN 15.9 02/23/2015 0957   CREATININE 0.9 02/23/2015 0957   CALCIUM 9.4 02/23/2015 0957   PROT 6.8 02/23/2015 0957   ALBUMIN 3.9 02/23/2015 0957   AST 24 02/23/2015 0957   ALT 22 02/23/2015 0957   ALKPHOS 93 02/23/2015 0957   BILITOT 1.58 (H) 02/23/2015 0957   GFRNONAA 78 (L) 10/29/2013 0500   GFRAA >90 10/29/2013 0500         Assessment & Plan:  45-year-old female with a past medical history of IBS, GERD, BRCA2 gene mutation status post prophylactic abdominal hysterectomy and bilateral salpingo-oophorectomy in 2012 with pending bilateral prophylactic mastectomy who is here for follow-up.  1. Intermittent loose stools/bilateral lower abdominal pain/bloating -- these symptoms are most consistent with irritable bowel. Prior celiac testing negative. Prior colonoscopy normal. We'll perform GI pathogen panel to exclude bacterial and parasitic infection. She may be an excellent candidate for rifaximin for IBS/D, but will await further testing. In the interim she can continue probiotic yogurt and also Kefir which she finds helpful.  2. MSH2 gene mutation -- this mutation is of uncertain significance though the MSH 2 gene mutation can be involved in Lynch syndrome. Lynch syndrome would have dramatic implications regarding screening for colorectal cancer with colonoscopy every 1-2 years. While this gene mutation significance remains unclear, I recommend repeating the colonoscopy for screening. She is in agreement but would like to defer this test until after her pending bilateral prophylactic mastectomy. I will see her back in early April after her surgery to discuss arranging colonoscopy.  3. Upper abdominal pain/history of gallbladder polyps -- she is interested in repeat abdominal ultrasound. We will order abdominal ultrasound today.  25 minutes spent with the patient today. Greater than 50% was spent in counseling and coordination of care with the patient  

## 2016-01-21 NOTE — Patient Instructions (Addendum)
Your physician has requested that you go to the basement for the following lab work before leaving today: GI Pathogen panel  Please follow up with Dr Hilarie Fredrickson in early April 2018.  You have been scheduled for an abdominal ultrasound at [facility] (1st floor of hospital) on 01/27/16 Thursday at 9:30 am. Please arrive 15 minutes prior to your appointment for registration. Make certain not to have anything to eat or drink 6 hours prior to your appointment. Should you need to reschedule your appointment, please contact radiology at [phone]. This test typically takes about 30 minutes to perform.  If you are age 47 or older, your body mass index should be between 23-30. Your Body mass index is 21.17 kg/m. If this is out of the aforementioned range listed, please consider follow up with your Primary Care Provider.  If you are age 30 or younger, your body mass index should be between 19-25. Your Body mass index is 21.17 kg/m. If this is out of the aformentioned range listed, please consider follow up with your Primary Care Provider.

## 2016-01-25 ENCOUNTER — Ambulatory Visit: Payer: Self-pay | Admitting: General Surgery

## 2016-01-27 ENCOUNTER — Ambulatory Visit (HOSPITAL_COMMUNITY)
Admission: RE | Admit: 2016-01-27 | Discharge: 2016-01-27 | Disposition: A | Discharge status: Home / Self Care | Payer: 59 | Source: Ambulatory Visit | Attending: Internal Medicine | Admitting: Internal Medicine

## 2016-01-27 DIAGNOSIS — K824 Cholesterolosis of gallbladder: Secondary | ICD-10-CM | POA: Diagnosis not present

## 2016-01-27 DIAGNOSIS — R1031 Right lower quadrant pain: Secondary | ICD-10-CM | POA: Diagnosis not present

## 2016-01-27 DIAGNOSIS — R195 Other fecal abnormalities: Secondary | ICD-10-CM | POA: Insufficient documentation

## 2016-01-27 DIAGNOSIS — Z9081 Acquired absence of spleen: Secondary | ICD-10-CM | POA: Insufficient documentation

## 2016-01-27 DIAGNOSIS — R1032 Left lower quadrant pain: Secondary | ICD-10-CM | POA: Insufficient documentation

## 2016-01-27 DIAGNOSIS — K589 Irritable bowel syndrome without diarrhea: Secondary | ICD-10-CM | POA: Diagnosis present

## 2016-01-28 ENCOUNTER — Ambulatory Visit
Admission: RE | Admit: 2016-01-28 | Discharge: 2016-01-28 | Disposition: A | Discharge status: Home / Self Care | Payer: 59 | Source: Ambulatory Visit | Attending: Obstetrics and Gynecology | Admitting: Obstetrics and Gynecology

## 2016-01-28 DIAGNOSIS — Z1501 Genetic susceptibility to malignant neoplasm of breast: Secondary | ICD-10-CM

## 2016-01-28 DIAGNOSIS — Z1509 Genetic susceptibility to other malignant neoplasm: Principal | ICD-10-CM

## 2016-02-03 ENCOUNTER — Inpatient Hospital Stay
Admission: RE | Admit: 2016-02-03 | Discharge: 2016-02-03 | Disposition: A | Discharge status: Home / Self Care | Payer: 59 | Source: Ambulatory Visit | Attending: Obstetrics and Gynecology | Admitting: Obstetrics and Gynecology

## 2016-02-09 ENCOUNTER — Other Ambulatory Visit: Payer: 59

## 2016-02-16 ENCOUNTER — Telehealth: Payer: Self-pay | Admitting: Hematology

## 2016-02-16 NOTE — Telephone Encounter (Signed)
Pt called to cxl 2/6 lab/ov appt due to getting surgery. Pt will call back to r/s appt at a later date

## 2016-02-22 ENCOUNTER — Other Ambulatory Visit: Payer: 59

## 2016-02-22 ENCOUNTER — Ambulatory Visit: Payer: 59 | Admitting: Hematology

## 2016-02-28 ENCOUNTER — Encounter: Payer: Self-pay | Admitting: Neurology

## 2016-02-28 ENCOUNTER — Ambulatory Visit (INDEPENDENT_AMBULATORY_CARE_PROVIDER_SITE_OTHER): Payer: 59 | Admitting: Neurology

## 2016-02-28 VITALS — BP 118/70 | HR 72 | Ht 63.0 in | Wt 119.1 lb

## 2016-02-28 DIAGNOSIS — G43009 Migraine without aura, not intractable, without status migrainosus: Secondary | ICD-10-CM | POA: Insufficient documentation

## 2016-02-28 NOTE — Patient Instructions (Signed)
1. Continue naratriptan and excedrin as needed. Do not take more than 2-3 times a week, otherwise this can worsen headaches 2. Keep a calendar of your migraines 3. Look into websites for migraines as provided 4. Follow-up in 4 months

## 2016-02-28 NOTE — Progress Notes (Signed)
NEUROLOGY CONSULTATION NOTE  Dawn Boyd MRN: [id number] DOB: 05/28/1970  Referring provider: Dr. Shon Baton Primary care provider: Dr. Shon Baton  Reason for consult:  Migraines second opinion  Dear Dr Virgina Jock:  Thank you for your kind referral of Dawn Boyd for consultation of the above symptoms. Although her history is well known to you, please allow me to reiterate it for the purpose of our medical record. Records and images were personally reviewed where available.  HISTORY OF PRESENT ILLNESS: This is a very pleasant 46 year old right-handed woman with a history of migraines since 2003, presenting for a second opinion on new approaches to her migraines. Migraines started after she gave birth to her son in 2003. They did get better after she had a hysterectomy 5 years ago, occurring every 2 weeks or so, however over the past 6 months, she has been having them more frequently, around 2-3 times a week. Migraines are over the frontal and vertex regions, sometimes behind her ears, she has throbbing pain with associated nausea if the headaches become really bad. She has really bad 10/10 ones where medications do not help much around once a month, lasting 2-3 days. She would take prn medication but headaches return. The migraines 2-3 times a week are around 6/10, usually relieved by 1/2 tablet of naratriptan combined with Excedrin migraine No visual obscurations, she has some photo and phonophobia. She has found playing tennis to be a trigger, she plays it twice a week and consistently gets a migraine, so she would preventatively take naratriptan and Excedrin before playing. Other triggers include perfume, chocolates, wine, cheese, poor sleep, skipping meals. She has found that drinking coffee helps, she usually drinks 1 cup of coffee daily. She reports hitting her head twice this year, with the last one she had worsened headaches which have resolved. She has had right-sided tinnitus for the past  year, ENT evaluation unremarkable. She had an MRI brain without contrast last 08/2015 which I personally reviewed, no acute changes seen, within normal limits. She previously took Maxalt which helped the migraine immediately, but did not last long enough, she would have migraine recurrence. She was switched to naratriptan, which does last longer, but takes a longer time to take effect. In the past she took an unrecalled antidepressant for migraine prevention, but had side effects of palpitations, which made her hesitant about preventative medications.  She denies any dizziness, diplopia, dysarthria/dysphagia, focal numbness/tingling/weakness, bowel/bladder dysfunction. She has some neck pain, at times with shooting pain in the back of her head, and feels that her migraines may be coming from her neck. She usually gets 7-8 hours of good sleep. There is a family history of migraines in her mother and sister. Her maternal grandmother had several strokes. She has a strong family history of cancer and was positive for BRCA 2 gene mutation, planning to have bilateral mastectomies soon.   PAST MEDICAL HISTORY: Past Medical History:  Diagnosis Date  . Allergy   . Anxiety    situational  . Blood dyscrasia    thrombocytopenia-age 1, resolved  . Blood transfusion    pt states age 49 in [place] had problems with blood clotting, no further problems  . Blood transfusion without reported diagnosis   . BRCA2 positive 09/08/2011   pt. has hx./ mother has hx. of ovarian cancer-no cancers for patient  . Bronchitis   . Clotting disorder ([place])    THROMOCYTOPENIA  AGE 62  . Depression    AFTER CHILD  BIRTH  . Gallbladder polyp   . GERD (gastroesophageal reflux disease)    tums prn  . Gilbert's syndrome    "Liver doesn't process bilirubin"  . Headache(784.0)    migraines-usually once weekly  . Heart murmur    no problems  . IBS (irritable bowel syndrome)   . Osteopenia    MILD  . PONV (postoperative nausea  and vomiting)   . Seasonal allergies   . 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 Voiding difficulty    history of post procedure difficulty voiding    PAST SURGICAL HISTORY: Past Surgical History:  Procedure Laterality Date  . ABDOMINAL HYSTERECTOMY    . LAPAROSCOPIC ASSISTED VAGINAL HYSTERECTOMY  11/22/2010   Procedure: LAPAROSCOPIC ASSISTED VAGINAL HYSTERECTOMY;  Surgeon: Arloa Koh;  Location: [facility];  Service: Gynecology;  Laterality: N/A;  . LAPAROSCOPIC SPLENECTOMY N/A 10/28/2013   Procedure: LAPAROSCOPIC SPLENECTOMY;  Surgeon: Coralie Keens, MD;  Location: [facility];  Service: General;  Laterality: N/A;  . nasal septum repair  2007  . RADIOLOGY WITH ANESTHESIA N/A 09/29/2013   Procedure: EMBOLIZATION ;  Surgeon: Azzie Roup, MD;  Location: [facility];  Service: Radiology;  Laterality: N/A;  . SALPINGOOPHORECTOMY  11/22/2010   Procedure: SALPINGO OOPHERECTOMY;  Surgeon: Arloa Koh;  Location: [facility];  Service: Gynecology;  Laterality: Bilateral;    MEDICATIONS: Current Outpatient Prescriptions on File Prior to Visit  Medication Sig Dispense Refill  . aspirin-acetaminophen-caffeine (EXCEDRIN MIGRAINE) 250-250-65 MG tablet Take 1 tablet by mouth daily as needed for migraine.    Marland Kitchen ibuprofen (ADVIL,MOTRIN) 200 MG tablet Take 200 mg by mouth as needed for headache.     . naratriptan (AMERGE) 2.5 MG tablet Take 1.25 mg by mouth daily.      No current facility-administered medications on file prior to visit.     ALLERGIES: Allergies  Allergen Reactions  . Lactose Intolerance (Gi) Diarrhea, Nausea Only and Other (See Comments)    Bloating   . Tape Dermatitis    Skin redness from the glue.    FAMILY HISTORY: Family History  Problem Relation Age of Onset  . Hypertension Mother   . Cancer Mother   . Colon cancer Neg Hx   . Esophageal cancer Neg Hx   . Pancreatic cancer Neg Hx   . Rectal cancer Neg Hx     SOCIAL HISTORY: Social History   Social History    . Marital status: Married    Spouse name: N/A  . Number of children: 2  . Years of education: N/A   Occupational History  . Artist    Social History Main Topics  . Smoking status: Former Smoker    Types: Cigars    Quit date: 10/23/1993  . Smokeless tobacco: Never Used     Comment: During college smoked cigars  . Alcohol use 4.2 oz/week    7 Cans of beer per week     Comment: social beer/ mixed drink, using less now  . Drug use: No  . Sexual activity: Yes   Other Topics Concern  . Not on file   Social History Narrative  . No narrative on file    REVIEW OF SYSTEMS: Constitutional: No fevers, chills, or sweats, no generalized fatigue, change in appetite Eyes: No visual changes, double vision, eye pain Ear, nose and throat: No hearing loss, ear pain, nasal congestion, sore throat Cardiovascular: No chest pain, palpitations Respiratory:  No shortness of breath at rest or with exertion, wheezes GastrointestinaI:  No nausea, vomiting, diarrhea, abdominal pain, fecal incontinence Genitourinary:  No dysuria, urinary retention or frequency Musculoskeletal:  + neck pain, back pain Integumentary: No rash, pruritus, skin lesions Neurological: as above Psychiatric: No depression, insomnia, anxiety Endocrine: No palpitations, fatigue, diaphoresis, mood swings, change in appetite, change in weight, increased thirst Hematologic/Lymphatic:  No anemia, purpura, petechiae. Allergic/Immunologic: no itchy/runny eyes, nasal congestion, recent allergic reactions, rashes  PHYSICAL EXAM: Vitals:   02/28/16 0904  BP: 118/70  Pulse: 72   General: No acute distress Head:  Normocephalic/atraumatic Eyes: Fundoscopic exam shows bilateral sharp discs, no vessel changes, exudates, or hemorrhages Neck: supple, no paraspinal tenderness, full range of motion Back: No paraspinal tenderness Heart: regular rate and rhythm Lungs: Clear to auscultation bilaterally. Vascular: No carotid  bruits. Skin/Extremities: No rash, no edema Neurological Exam: Mental status: alert and oriented to person, place, and time, no dysarthria or aphasia, Fund of knowledge is appropriate.  Recent and remote memory are intact.  Attention and concentration are normal.    Able to name objects and repeat phrases. Cranial nerves: CN I: not tested CN II: pupils equal, round and reactive to light, visual fields intact, fundi unremarkable. CN III, IV, VI:  full range of motion, no nystagmus, no ptosis CN V: facial sensation intact CN VII: upper and lower face symmetric CN VIII: hearing intact to finger rub CN IX, X: gag intact, uvula midline CN XI: sternocleidomastoid and trapezius muscles intact CN XII: tongue midline Bulk & Tone: normal, no fasciculations. Motor: 5/5 throughout with no pronator drift. Sensation: intact to light touch, cold, pin, vibration and joint position sense.  No extinction to double simultaneous stimulation.  Romberg test negative Deep Tendon Reflexes: +2 throughout, no ankle clonus Plantar responses: downgoing bilaterally Cerebellar: no incoordination on finger to nose, heel to shin. No dysdiadochokinesia Gait: narrow-based and steady, able to tandem walk adequately. Tremor: none  IMPRESSION: This is a pleasant 46 year old right-handed woman with a history of migraines without aura, presenting for second opinion regarding other treatment options. Migraines have increased in frequency over the past 6 months, possibly related to head injuries. Her neurological exam and MRI brain normal. We discussed starting preventative migraine medication since she is having 2-3 migraines a week. She is hesitant and would like to look into options first. We discussed indications for migraine preventative medications, as well as minimizing rescue medications to 2-3 times a week to avoid rebound headaches. She will keep a headache calendar and follow-up in 4 months.   Thank you for allowing me  to participate in the care of this patient. Please do not hesitate to call for any questions or concerns.   Ellouise Newer, M.D.  CC: Dr. Virgina Jock

## 2016-02-29 ENCOUNTER — Encounter (HOSPITAL_COMMUNITY)
Admission: RE | Admit: 2016-02-29 | Discharge: 2016-02-29 | Disposition: A | Discharge status: Home / Self Care | Payer: 59 | Source: Ambulatory Visit | Attending: General Surgery | Admitting: General Surgery

## 2016-02-29 ENCOUNTER — Encounter (HOSPITAL_COMMUNITY): Payer: Self-pay

## 2016-02-29 DIAGNOSIS — Z01818 Encounter for other preprocedural examination: Secondary | ICD-10-CM | POA: Insufficient documentation

## 2016-02-29 DIAGNOSIS — Z1501 Genetic susceptibility to malignant neoplasm of breast: Secondary | ICD-10-CM | POA: Insufficient documentation

## 2016-02-29 DIAGNOSIS — Z1502 Genetic susceptibility to malignant neoplasm of ovary: Secondary | ICD-10-CM | POA: Diagnosis not present

## 2016-02-29 HISTORY — DX: Unspecified osteoarthritis, unspecified site: M19.90

## 2016-02-29 LAB — BASIC METABOLIC PANEL
ANION GAP: 12 (ref 5–15)
BUN: 17 mg/dL (ref 6–20)
CALCIUM: 10 mg/dL (ref 8.9–10.3)
CO2: 24 mmol/L (ref 22–32)
Chloride: 104 mmol/L (ref 101–111)
Creatinine, Ser: 0.79 mg/dL (ref 0.44–1.00)
GFR calc Af Amer: >60 mL/min (ref >60)
GFR calc non Af Amer: >60 mL/min (ref >60)
GLUCOSE: 94 mg/dL (ref 65–99)
POTASSIUM: 3.8 mmol/L (ref 3.5–5.1)
Sodium: 140 mmol/L (ref 135–145)

## 2016-02-29 LAB — CBC
HCT: 37.5 % (ref 36.0–46.0)
Hemoglobin: 12.6 g/dL (ref 12.0–15.0)
MCH: 29.8 pg (ref 26.0–34.0)
MCHC: 33.6 g/dL (ref 30.0–36.0)
MCV: 88.7 fL (ref 78.0–100.0)
PLATELETS: 318 10*3/uL (ref 150–400)
RBC: 4.23 MIL/uL (ref 3.87–5.11)
RDW: 12.7 % (ref 11.5–15.5)
WBC: 7.2 10*3/uL (ref 4.0–10.5)

## 2016-02-29 NOTE — Pre-Procedure Instructions (Signed)
Dawn Boyd  02/29/2016      [facility], [place] [address] Phone: [phone] Fax: [phone]    Your procedure is scheduled on Wednesday February 21.  Report to [facility] Admitting at 6:30 A.M.  Call this number if you have problems the morning of surgery:  [phone]   Remember:  Do not eat food or drink liquids after midnight.  Take these medicines the morning of surgery with A SIP OF WATER: naratriptan (amerge)  7 days prior to surgery STOP taking any Excedrin Migraine, Aspirin, Aleve, Naproxen, Ibuprofen, Motrin, Advil, Goody's, BC's, all herbal medications, fish oil, and all vitamins  REFER TO ENHANCED RECOVERY AFTER SURGERY INSTRUCTIONS    Do not wear jewelry, make-up or nail polish.    Do not wear lotions, powders, or perfumes, or deoderant.   Do not shave 48 hours prior to surgery.   Do not bring valuables to the hospital.   [facility] is not responsible for any belongings or valuables.  Contacts, dentures or bridgework may not be worn into surgery.  Leave your suitcase in the car.  After surgery it may be brought to your room.  For patients admitted to the hospital, discharge time will be determined by your treatment team.  Patients discharged the day of surgery will not be allowed to drive home.   Special instructions:    [place] [facility]- Preparing For Surgery  Before surgery, you can play an important role. Because skin is not sterile, your skin needs to be as free of germs as possible. You can reduce the number of germs on your skin by washing with CHG (chlorahexidine gluconate) Soap before surgery.  CHG is an antiseptic cleaner which kills germs and bonds with the skin to continue killing germs even after washing.  Please do not use if you have an allergy to CHG or antibacterial soaps. If your skin becomes reddened/irritated stop using the CHG.   Do not shave (including legs and underarms) for at least 48 hours prior to first CHG shower. It is OK to shave your face.  Please follow these instructions carefully.   1. Shower the NIGHT BEFORE SURGERY and the MORNING OF SURGERY with CHG.   2. If you chose to wash your hair, wash your hair first as usual with your normal shampoo.  3. After you shampoo, rinse your hair and body thoroughly to remove the shampoo.  4. Use CHG as you would any other liquid soap. You can apply CHG directly to the skin and wash gently with a scrungie or a clean washcloth.   5. Apply the CHG Soap to your body ONLY FROM THE NECK DOWN.  Do not use on open wounds or open sores. Avoid contact with your eyes, ears, mouth and genitals (private parts). Wash genitals (private parts) with your normal soap.  6. Wash thoroughly, paying special attention to the area where your surgery will be performed.  7. Thoroughly rinse your body with warm water from the neck down.  8. DO NOT shower/wash with your normal soap after using and rinsing off the CHG Soap.  9. Pat yourself dry with a CLEAN TOWEL.   10. Wear CLEAN PAJAMAS   11. Place CLEAN SHEETS on your bed the night of your first shower and DO NOT SLEEP WITH PETS.    Day of Surgery: Do not apply any deodorants/lotions. Please wear clean clothes to  the hospital/surgery center.

## 2016-02-29 NOTE — Progress Notes (Signed)
Pt. Reports lactose intolerance, Boost breeze has milk ingredient. Call to [facility], spoke with April, she will let Dr. Marlou Starks be aware that pt. Is agreeable to the water 2 hr. Prior to arrival in place of [employer].

## 2016-03-08 ENCOUNTER — Ambulatory Visit (HOSPITAL_COMMUNITY): Payer: 59 | Admitting: Anesthesiology

## 2016-03-08 ENCOUNTER — Encounter (HOSPITAL_COMMUNITY)
Admission: RE | Disposition: A | Discharge status: Home / Self Care | Payer: Self-pay | Source: Ambulatory Visit | Attending: General Surgery

## 2016-03-08 ENCOUNTER — Encounter (HOSPITAL_COMMUNITY): Payer: Self-pay | Admitting: *Deleted

## 2016-03-08 ENCOUNTER — Ambulatory Visit (HOSPITAL_COMMUNITY)
Admission: RE | Admit: 2016-03-08 | Discharge: 2016-03-09 | Disposition: A | Discharge status: Home / Self Care | Payer: 59 | Source: Ambulatory Visit | Attending: General Surgery | Admitting: General Surgery

## 2016-03-08 DIAGNOSIS — F329 Major depressive disorder, single episode, unspecified: Secondary | ICD-10-CM | POA: Diagnosis not present

## 2016-03-08 DIAGNOSIS — D242 Benign neoplasm of left breast: Secondary | ICD-10-CM | POA: Insufficient documentation

## 2016-03-08 DIAGNOSIS — N6011 Diffuse cystic mastopathy of right breast: Secondary | ICD-10-CM | POA: Insufficient documentation

## 2016-03-08 DIAGNOSIS — Z87891 Personal history of nicotine dependence: Secondary | ICD-10-CM | POA: Diagnosis not present

## 2016-03-08 DIAGNOSIS — N6012 Diffuse cystic mastopathy of left breast: Secondary | ICD-10-CM | POA: Diagnosis not present

## 2016-03-08 DIAGNOSIS — N6041 Mammary duct ectasia of right breast: Secondary | ICD-10-CM | POA: Insufficient documentation

## 2016-03-08 DIAGNOSIS — F419 Anxiety disorder, unspecified: Secondary | ICD-10-CM | POA: Diagnosis not present

## 2016-03-08 DIAGNOSIS — Z4001 Encounter for prophylactic removal of breast: Secondary | ICD-10-CM | POA: Diagnosis not present

## 2016-03-08 DIAGNOSIS — I739 Peripheral vascular disease, unspecified: Secondary | ICD-10-CM | POA: Insufficient documentation

## 2016-03-08 DIAGNOSIS — N6042 Mammary duct ectasia of left breast: Secondary | ICD-10-CM | POA: Insufficient documentation

## 2016-03-08 DIAGNOSIS — Z888 Allergy status to other drugs, medicaments and biological substances status: Secondary | ICD-10-CM | POA: Insufficient documentation

## 2016-03-08 DIAGNOSIS — E739 Lactose intolerance, unspecified: Secondary | ICD-10-CM | POA: Diagnosis not present

## 2016-03-08 DIAGNOSIS — K219 Gastro-esophageal reflux disease without esophagitis: Secondary | ICD-10-CM | POA: Insufficient documentation

## 2016-03-08 DIAGNOSIS — Z1501 Genetic susceptibility to malignant neoplasm of breast: Secondary | ICD-10-CM | POA: Diagnosis not present

## 2016-03-08 DIAGNOSIS — Z1502 Genetic susceptibility to malignant neoplasm of ovary: Secondary | ICD-10-CM | POA: Diagnosis present

## 2016-03-08 DIAGNOSIS — M1990 Unspecified osteoarthritis, unspecified site: Secondary | ICD-10-CM | POA: Insufficient documentation

## 2016-03-08 DIAGNOSIS — Z1509 Genetic susceptibility to other malignant neoplasm: Secondary | ICD-10-CM

## 2016-03-08 HISTORY — PX: TOTAL MASTECTOMY: SHX6129

## 2016-03-08 HISTORY — PX: MASTECTOMY: SHX3

## 2016-03-08 SURGERY — MASTECTOMY, SIMPLE
Anesthesia: Regional | Site: Breast | Laterality: Bilateral

## 2016-03-08 MED ORDER — METHOCARBAMOL 500 MG PO TABS
500.0000 mg | ORAL_TABLET | Freq: Four times a day (QID) | ORAL | Status: DC | PRN
  Administered 2016-03-08: 500 mg via ORAL
  Filled 2016-03-08: qty 1

## 2016-03-08 MED ORDER — MIDAZOLAM HCL 2 MG/2ML IJ SOLN
INTRAMUSCULAR | Status: AC
  Filled 2016-03-08: qty 2

## 2016-03-08 MED ORDER — FENTANYL CITRATE (PF) 100 MCG/2ML IJ SOLN
INTRAMUSCULAR | Status: AC
  Filled 2016-03-08: qty 2

## 2016-03-08 MED ORDER — SCOPOLAMINE 1 MG/3DAYS TD PT72
MEDICATED_PATCH | TRANSDERMAL | Status: DC | PRN
  Administered 2016-03-08: 1 via TRANSDERMAL

## 2016-03-08 MED ORDER — MIDAZOLAM HCL 5 MG/5ML IJ SOLN
INTRAMUSCULAR | Status: DC | PRN
  Administered 2016-03-08: 2 mg via INTRAVENOUS

## 2016-03-08 MED ORDER — MINERAL OIL LIGHT 100 % EX OIL
TOPICAL_OIL | CUTANEOUS | Status: AC
  Filled 2016-03-08: qty 25

## 2016-03-08 MED ORDER — OXYCODONE HCL 5 MG PO TABS: 5.0000 mg | ORAL_TABLET | Freq: Once | ORAL | Status: DC | PRN

## 2016-03-08 MED ORDER — DEXAMETHASONE SODIUM PHOSPHATE 10 MG/ML IJ SOLN
INTRAMUSCULAR | Status: DC | PRN
  Administered 2016-03-08: 10 mg via INTRAVENOUS

## 2016-03-08 MED ORDER — HEPARIN SODIUM (PORCINE) 5000 UNIT/ML IJ SOLN: 5000.0000 [IU] | Freq: Three times a day (TID) | INTRAMUSCULAR | Status: DC

## 2016-03-08 MED ORDER — CHLORHEXIDINE GLUCONATE CLOTH 2 % EX PADS: 6.0000 | MEDICATED_PAD | Freq: Once | CUTANEOUS | Status: DC

## 2016-03-08 MED ORDER — LIDOCAINE HCL (CARDIAC) 20 MG/ML IV SOLN
INTRAVENOUS | Status: DC | PRN
  Administered 2016-03-08: 40 mg via INTRAVENOUS

## 2016-03-08 MED ORDER — HYDROMORPHONE HCL 1 MG/ML IJ SOLN: 0.2500 mg | INTRAMUSCULAR | Status: DC | PRN

## 2016-03-08 MED ORDER — SUGAMMADEX SODIUM 200 MG/2ML IV SOLN
INTRAVENOUS | Status: AC
  Filled 2016-03-08: qty 2

## 2016-03-08 MED ORDER — ACETAMINOPHEN 500 MG PO TABS
1000.0000 mg | ORAL_TABLET | ORAL | Status: AC
  Administered 2016-03-08: 1000 mg via ORAL
  Filled 2016-03-08: qty 2

## 2016-03-08 MED ORDER — FENTANYL CITRATE (PF) 100 MCG/2ML IJ SOLN
INTRAMUSCULAR | Status: DC | PRN
Start: 2016-03-08 — End: 2016-03-08
  Administered 2016-03-08 (×2): 50 ug via INTRAVENOUS

## 2016-03-08 MED ORDER — EPHEDRINE 5 MG/ML INJ
INTRAVENOUS | Status: AC
  Filled 2016-03-08: qty 10

## 2016-03-08 MED ORDER — OXYCODONE HCL 5 MG/5ML PO SOLN: 5.0000 mg | Freq: Once | ORAL | Status: DC | PRN

## 2016-03-08 MED ORDER — GABAPENTIN 300 MG PO CAPS
300.0000 mg | ORAL_CAPSULE | ORAL | Status: AC
  Administered 2016-03-08: 300 mg via ORAL
  Filled 2016-03-08: qty 1

## 2016-03-08 MED ORDER — 0.9 % SODIUM CHLORIDE (POUR BTL) OPTIME
TOPICAL | Status: DC | PRN
  Administered 2016-03-08: 1000 mL

## 2016-03-08 MED ORDER — EPHEDRINE SULFATE-NACL 50-0.9 MG/10ML-% IV SOSY
PREFILLED_SYRINGE | INTRAVENOUS | Status: DC | PRN
  Administered 2016-03-08: 10 mg via INTRAVENOUS

## 2016-03-08 MED ORDER — KCL IN DEXTROSE-NACL 20-5-0.9 MEQ/L-%-% IV SOLN
INTRAVENOUS | Status: DC
  Administered 2016-03-08: 12:00:00 via INTRAVENOUS
  Filled 2016-03-08 (×2): qty 1000

## 2016-03-08 MED ORDER — SUMATRIPTAN SUCCINATE 50 MG PO TABS
50.0000 mg | ORAL_TABLET | Freq: Once | ORAL | Status: DC
  Filled 2016-03-08 (×2): qty 1

## 2016-03-08 MED ORDER — ONDANSETRON HCL 4 MG/2ML IJ SOLN
INTRAMUSCULAR | Status: AC
  Filled 2016-03-08: qty 2

## 2016-03-08 MED ORDER — ROCURONIUM BROMIDE 100 MG/10ML IV SOLN
INTRAVENOUS | Status: DC | PRN
  Administered 2016-03-08: 50 mg via INTRAVENOUS

## 2016-03-08 MED ORDER — PROPOFOL 10 MG/ML IV BOLUS
INTRAVENOUS | Status: AC
  Filled 2016-03-08: qty 20

## 2016-03-08 MED ORDER — ONDANSETRON HCL 4 MG/2ML IJ SOLN
INTRAMUSCULAR | Status: DC | PRN
  Administered 2016-03-08: 4 mg via INTRAVENOUS

## 2016-03-08 MED ORDER — PHENYLEPHRINE HCL 10 MG/ML IJ SOLN
INTRAVENOUS | Status: DC | PRN
  Administered 2016-03-08: 15 ug/min via INTRAVENOUS

## 2016-03-08 MED ORDER — CEFAZOLIN SODIUM-DEXTROSE 2-4 GM/100ML-% IV SOLN
2.0000 g | INTRAVENOUS | Status: AC
  Administered 2016-03-08: 2 g via INTRAVENOUS
  Filled 2016-03-08: qty 100

## 2016-03-08 MED ORDER — ONDANSETRON 4 MG PO TBDP: 4.0000 mg | ORAL_TABLET | Freq: Four times a day (QID) | ORAL | Status: DC | PRN

## 2016-03-08 MED ORDER — MORPHINE SULFATE (PF) 2 MG/ML IV SOLN: 1.0000 mg | INTRAVENOUS | Status: DC | PRN

## 2016-03-08 MED ORDER — HYDROCODONE-ACETAMINOPHEN 5-325 MG PO TABS
1.0000 | ORAL_TABLET | ORAL | Status: DC | PRN
  Administered 2016-03-08 – 2016-03-09 (×7): 2 via ORAL
  Filled 2016-03-08 (×7): qty 2

## 2016-03-08 MED ORDER — PANTOPRAZOLE SODIUM 40 MG IV SOLR
40.0000 mg | Freq: Every day | INTRAVENOUS | Status: DC
  Administered 2016-03-08: 40 mg via INTRAVENOUS
  Filled 2016-03-08: qty 40

## 2016-03-08 MED ORDER — SUGAMMADEX SODIUM 200 MG/2ML IV SOLN
INTRAVENOUS | Status: DC | PRN
  Administered 2016-03-08: 200 mg via INTRAVENOUS

## 2016-03-08 MED ORDER — DEXAMETHASONE SODIUM PHOSPHATE 10 MG/ML IJ SOLN
INTRAMUSCULAR | Status: AC
  Filled 2016-03-08: qty 1

## 2016-03-08 MED ORDER — LACTATED RINGERS IV SOLN
INTRAVENOUS | Status: DC | PRN
  Administered 2016-03-08 (×2): via INTRAVENOUS

## 2016-03-08 MED ORDER — LIDOCAINE 2% (20 MG/ML) 5 ML SYRINGE
INTRAMUSCULAR | Status: AC
  Filled 2016-03-08: qty 5

## 2016-03-08 MED ORDER — PROPOFOL 10 MG/ML IV BOLUS
INTRAVENOUS | Status: DC | PRN
  Administered 2016-03-08: 150 mg via INTRAVENOUS

## 2016-03-08 MED ORDER — CELECOXIB 200 MG PO CAPS
400.0000 mg | ORAL_CAPSULE | ORAL | Status: AC
  Administered 2016-03-08: 400 mg via ORAL
  Filled 2016-03-08: qty 2

## 2016-03-08 MED ORDER — ONDANSETRON HCL 4 MG/2ML IJ SOLN
4.0000 mg | Freq: Four times a day (QID) | INTRAMUSCULAR | Status: DC | PRN
  Administered 2016-03-08: 4 mg via INTRAVENOUS
  Filled 2016-03-08: qty 2

## 2016-03-08 SURGICAL SUPPLY — 50 items
ADH SKN CLS APL DERMABOND .7 (GAUZE/BANDAGES/DRESSINGS) ×1
APPLIER CLIP 9.375 MED OPEN (MISCELLANEOUS) ×3
APR CLP MED 9.3 20 MLT OPN (MISCELLANEOUS) ×1
BINDER BREAST LRG (GAUZE/BANDAGES/DRESSINGS) IMPLANT
BINDER BREAST MEDIUM (GAUZE/BANDAGES/DRESSINGS) ×2 IMPLANT
BIOPATCH RED 1 DISK 7.0 (GAUZE/BANDAGES/DRESSINGS) ×4 IMPLANT
BIOPATCH RED 1IN DISK 7.0MM (GAUZE/BANDAGES/DRESSINGS) ×2
BLADE PHOTON ILLUMINATED (MISCELLANEOUS) ×2 IMPLANT
BLADE SURG 10 STRL SS (BLADE) ×2 IMPLANT
CANISTER SUCTION 2500CC (MISCELLANEOUS) ×3 IMPLANT
CHLORAPREP W/TINT 26ML (MISCELLANEOUS) ×3 IMPLANT
CLIP APPLIE 9.375 MED OPEN (MISCELLANEOUS) ×1 IMPLANT
COVER SURGICAL LIGHT HANDLE (MISCELLANEOUS) ×3 IMPLANT
DERMABOND ADVANCED (GAUZE/BANDAGES/DRESSINGS) ×2
DERMABOND ADVANCED .7 DNX12 (GAUZE/BANDAGES/DRESSINGS) ×1 IMPLANT
DRAIN CHANNEL 19F RND (DRAIN) ×3 IMPLANT
DRAPE LAPAROSCOPIC ABDOMINAL (DRAPES) ×3 IMPLANT
ELECT COATED BLADE 2.86 ST (ELECTRODE) ×3 IMPLANT
ELECT REM PT RETURN 9FT ADLT (ELECTROSURGICAL) ×6
ELECTRODE REM PT RTRN 9FT ADLT (ELECTROSURGICAL) ×1 IMPLANT
EVACUATOR SILICONE 100CC (DRAIN) ×5 IMPLANT
GAUZE SPONGE 4X4 12PLY STRL (GAUZE/BANDAGES/DRESSINGS) ×3 IMPLANT
GLOVE BIO SURGEON STRL SZ7 (GLOVE) ×2 IMPLANT
GLOVE BIO SURGEON STRL SZ7.5 (GLOVE) ×3 IMPLANT
GLOVE BIO SURGEON STRL SZ8 (GLOVE) ×2 IMPLANT
GLOVE BIOGEL PI IND STRL 7.5 (GLOVE) IMPLANT
GLOVE BIOGEL PI IND STRL 8.5 (GLOVE) IMPLANT
GLOVE BIOGEL PI INDICATOR 7.5 (GLOVE) ×2
GLOVE BIOGEL PI INDICATOR 8.5 (GLOVE) ×2
GOWN STRL REUS W/ TWL LRG LVL3 (GOWN DISPOSABLE) ×2 IMPLANT
GOWN STRL REUS W/ TWL XL LVL3 (GOWN DISPOSABLE) IMPLANT
GOWN STRL REUS W/TWL LRG LVL3 (GOWN DISPOSABLE) ×6
GOWN STRL REUS W/TWL XL LVL3 (GOWN DISPOSABLE) ×3
KIT BASIN OR (CUSTOM PROCEDURE TRAY) ×3 IMPLANT
KIT ROOM TURNOVER OR (KITS) ×3 IMPLANT
LIGHT WAVEGUIDE WIDE FLAT (MISCELLANEOUS) IMPLANT
NS IRRIG 1000ML POUR BTL (IV SOLUTION) ×3 IMPLANT
PACK GENERAL/GYN (CUSTOM PROCEDURE TRAY) ×3 IMPLANT
PAD ABD 8X10 STRL (GAUZE/BANDAGES/DRESSINGS) ×6 IMPLANT
PAD ARMBOARD 7.5X6 YLW CONV (MISCELLANEOUS) ×3 IMPLANT
SPECIMEN JAR X LARGE (MISCELLANEOUS) ×5 IMPLANT
SUT ETHILON 3 0 FSL (SUTURE) ×5 IMPLANT
SUT MON AB 4-0 PC3 18 (SUTURE) ×5 IMPLANT
SUT VIC AB 3-0 54X BRD REEL (SUTURE) IMPLANT
SUT VIC AB 3-0 BRD 54 (SUTURE) ×3
SUT VIC AB 3-0 SH 18 (SUTURE) ×5 IMPLANT
TOWEL OR 17X24 6PK STRL BLUE (TOWEL DISPOSABLE) ×3 IMPLANT
TOWEL OR 17X26 10 PK STRL BLUE (TOWEL DISPOSABLE) ×3 IMPLANT
TUBE CONNECTING 12'X1/4 (SUCTIONS) ×1
TUBE CONNECTING 12X1/4 (SUCTIONS) ×1 IMPLANT

## 2016-03-08 NOTE — Anesthesia Procedure Notes (Signed)
Procedure Name: Intubation Date/Time: 03/08/2016 8:44 AM Performed by: Kyung Rudd Pre-anesthesia Checklist: Patient identified, Emergency Drugs available, Suction available and Patient being monitored Patient Re-evaluated:Patient Re-evaluated prior to inductionOxygen Delivery Method: Circle system utilized Preoxygenation: Pre-oxygenation with 100% oxygen Intubation Type: IV induction Ventilation: Mask ventilation without difficulty Laryngoscope Size: Mac and 3 Grade View: Grade I Tube type: Oral Tube size: 7.0 mm Number of attempts: 1 Airway Equipment and Method: Stylet Placement Confirmation: ETT inserted through vocal cords under direct vision,  positive ETCO2 and breath sounds checked- equal and bilateral Secured at: 21 cm Tube secured with: Tape Dental Injury: Teeth and Oropharynx as per pre-operative assessment

## 2016-03-08 NOTE — Anesthesia Preprocedure Evaluation (Addendum)
Anesthesia Evaluation  Patient identified by MRN, date of birth, ID band Patient awake    Reviewed: Allergy & Precautions, NPO status , Patient's Chart, lab work & pertinent test results  History of Anesthesia Complications (+) PONV and history of anesthetic complications  Airway Mallampati: I  TM Distance: >3 FB Neck ROM: Full    Dental  (+) Teeth Intact   Pulmonary former smoker,    breath sounds clear to auscultation       Cardiovascular + Peripheral Vascular Disease   Rhythm:Regular     Neuro/Psych  Headaches, PSYCHIATRIC DISORDERS Anxiety Depression    GI/Hepatic Neg liver ROS, GERD  Medicated and Controlled,  Endo/Other  negative endocrine ROS  Renal/GU negative Renal ROS     Musculoskeletal  (+) Arthritis ,   Abdominal   Peds  Hematology   Anesthesia Other Findings   Reproductive/Obstetrics                            Anesthesia Physical Anesthesia Plan  ASA: I  Anesthesia Plan: General   Post-op Pain Management: GA combined w/ Regional for post-op pain   Induction: Intravenous  Airway Management Planned: Oral ETT  Additional Equipment: None  Intra-op Plan:   Post-operative Plan: Extubation in OR  Informed Consent: I have reviewed the patients History and Physical, chart, labs and discussed the procedure including the risks, benefits and alternatives for the proposed anesthesia with the patient or authorized representative who has indicated his/her understanding and acceptance.   Dental advisory given  Plan Discussed with: Anesthesiologist, CRNA and Surgeon  Anesthesia Plan Comments:        Anesthesia Quick Evaluation

## 2016-03-08 NOTE — Progress Notes (Signed)
Pt. Has no nausea/vomiting. Advanced diet per order.

## 2016-03-08 NOTE — Interval H&P Note (Signed)
History and Physical Interval Note:  03/08/2016 8:13 AM  Dawn Boyd  has presented today for surgery, with the diagnosis of BRCA 2 POSITIVE  The various methods of treatment have been discussed with the patient and family. After consideration of risks, benefits and other options for treatment, the patient has consented to  Procedure(s): BILATERAL MASTECTOMIES (Bilateral) as a surgical intervention .  The patient's history has been reviewed, patient examined, no change in status, stable for surgery.  I have reviewed the patient's chart and labs.  Questions were answered to the patient's satisfaction.     TOTH III,Jerran Tappan S

## 2016-03-08 NOTE — H&P (Signed)
Dawn Boyd  Location: [facility] Patient #: [id number] DOB: 1970-06-19 Married / Language: English / Race: White Female   History of Present Illness  Patient words: masty.  The patient is a 46 year old female who presents for a follow-up for Breast problems. The patient is a 46 year old white female who has a known BRCA2 mutation for approximately the last 6 years. She has been getting periodic mammogram and MRI evaluations which have been clean. She has already had her uterus and ovaries removed. She is now interested in having bilateral mastectomies. She is not interested in reconstruction. She denies any breast pain or discharge from the nipple. She has had an inverted nipple on the right side for a couple of years.   Allergies Lactose Intolerance *DIGESTIVE AIDS*  Tape 1"X5yd *MEDICAL DEVICES AND SUPPLIES*   Medication History Amerge (2.5MG Tablet, Oral) Active. Bactroban Nasal (2% Ointment, Nasal) Active. Medications Reconciled    Review of Systems General Not Present- Appetite Loss, Chills, Fatigue, Fever, Night Sweats, Weight Gain and Weight Loss. Skin Not Present- Change in Wart/Mole, Dryness, Hives, Jaundice, New Lesions, Non-Healing Wounds, Rash and Ulcer. HEENT Present- Hoarseness, Seasonal Allergies and Sinus Pain. Not Present- Earache, Hearing Loss, Nose Bleed, Oral Ulcers, Ringing in the Ears, Sore Throat, Visual Disturbances, Wears glasses/contact lenses and Yellow Eyes. Respiratory Not Present- Bloody sputum, Chronic Cough, Difficulty Breathing, Snoring and Wheezing. Breast Not Present- Breast Mass, Breast Pain, Nipple Discharge and Skin Changes. Gastrointestinal Present- Bloating. Not Present- Abdominal Pain, Bloody Stool, Change in Bowel Habits, Chronic diarrhea, Constipation, Difficulty Swallowing, Excessive gas, Gets full quickly at meals, Hemorrhoids, Indigestion, Nausea, Rectal Pain and Vomiting. Female Genitourinary Present- Frequency  and Urgency. Not Present- Nocturia, Painful Urination and Pelvic Pain. Musculoskeletal Present- Back Pain. Not Present- Joint Pain, Joint Stiffness, Muscle Pain, Muscle Weakness and Swelling of Extremities. Neurological Present- Headaches. Not Present- Decreased Memory, Fainting, Numbness, Seizures, Tingling, Tremor, Trouble walking and Weakness. Psychiatric Not Present- Anxiety, Bipolar, Change in Sleep Pattern, Depression, Fearful and Frequent crying. Endocrine Not Present- Cold Intolerance, Excessive Hunger, Hair Changes, Heat Intolerance, Hot flashes and New Diabetes. Hematology Not Present- Easy Bruising, Excessive bleeding, Gland problems, HIV and Persistent Infections.  Vitals  Weight: 118 lb Height: 63in Body Surface Area: 1.55 m Body Mass Index: 20.9 kg/m  Temp.: 98.6F  Pulse: 75 (Regular)  BP: 112/76 (Sitting, Left Arm, Standard)       Physical Exam General Mental Status-Alert. General Appearance-Consistent with stated age. Hydration-Well hydrated. Voice-Normal.  Head and Neck Head-normocephalic, atraumatic with no lesions or palpable masses. Trachea-midline. Thyroid Gland Characteristics - normal size and consistency.  Eye Eyeball - Bilateral-Extraocular movements intact. Sclera/Conjunctiva - Bilateral-No scleral icterus.  Chest and Lung Exam Chest and lung exam reveals -quiet, even and easy respiratory effort with no use of accessory muscles and on auscultation, normal breath sounds, no adventitious sounds and normal vocal resonance. Inspection Chest Wall - Normal. Back - normal.  Breast Note: There is no palpable mass in either breast. There is no palpable axillary, supra clavicular, or cervical lymphadenopathy. She does have an inverted nipple on the right   Cardiovascular Cardiovascular examination reveals -normal heart sounds, regular rate and rhythm with no murmurs and normal pedal pulses  bilaterally.  Abdomen Inspection Inspection of the abdomen reveals - No Hernias. Skin - Scar - no surgical scars. Palpation/Percussion Palpation and Percussion of the abdomen reveal - Soft, Non Tender, No Rebound tenderness, No Rigidity (guarding) and No hepatosplenomegaly. Auscultation Auscultation of the abdomen reveals - Bowel  sounds normal.  Neurologic Neurologic evaluation reveals -alert and oriented x 3 with no impairment of recent or remote memory. Mental Status-Normal.  Musculoskeletal Normal Exam - Left-Upper Extremity Strength Normal and Lower Extremity Strength Normal. Normal Exam - Right-Upper Extremity Strength Normal and Lower Extremity Strength Normal.  Lymphatic Head & Neck  General Head & Neck Lymphatics: Bilateral - Description - Normal. Axillary  General Axillary Region: Bilateral - Description - Normal. Tenderness - Non Tender. Femoral & Inguinal  Generalized Femoral & Inguinal Lymphatics: Bilateral - Description - Normal. Tenderness - Non Tender.    Assessment & Plan  BRCA POSITIVE (Z15.01) Impression: The patient has a known BRCA2 mutation. She has already had her ovaries removed. She is now interested in having bilateral mastectomies. I have discussed with her in detail the risks and benefits of the operation as well as some of the technical aspects and she understands and wishes to proceed. She does not want reconstruction. We will have her undergo mammogram and MRI prior to surgery to make sure that she still looks clean.

## 2016-03-08 NOTE — Op Note (Signed)
03/08/2016  10:06 AM  PATIENT:  Dawn Boyd  46 y.o. female  PRE-OPERATIVE DIAGNOSIS:  BRCA 2 POSITIVE  POST-OPERATIVE DIAGNOSIS:  BRCA 2 POSITIVE  PROCEDURE:  Procedure(s): BILATERAL MASTECTOMIES (Bilateral)  SURGEON:  Surgeon(s) and Role:    * Jovita Kussmaul, MD - Primary  PHYSICIAN ASSISTANT:   ASSISTANTS: Judyann Munson, RNFA   ANESTHESIA:   general  EBL:  Total I/O In: 900 [I.V.:900] Out: -   BLOOD ADMINISTERED:none  DRAINS: (2) Jackson-Pratt drain(s) with closed bulb suction in the prepectoral space   LOCAL MEDICATIONS USED:  NONE  SPECIMEN:  Source of Specimen:  bilateral simple mastectomies  DISPOSITION OF SPECIMEN:  PATHOLOGY  COUNTS:  YES  TOURNIQUET:  * No tourniquets in log *  DICTATION: .Dragon Dictation   After informed consent was obtained the patient was brought to the operating room and placed in the supine position on the operating room table. After adequate induction of general anesthesia the patient's bilateral chest, breast, and axillary areas were prepped with ChloraPrep, allowed to dry, and draped in usual sterile manner. An appropriate timeout was performed. The patient was BRCA2 positive and has elected for bilateral simple prophylactic mastectomies. Attention was first turned to the right breast. An elliptical incision was made around the nipple and areola complex with a 10 blade knife in order to minimize the excess skin. The incision was carried through the skin and subcutaneous tissue sharply with the photon blade. Breast hooks were used to elevate the skin flaps anteriorly towards the ceiling. Thin skin flaps were created circumferentially between the breast tissue and subcutaneous fat. This dissection was carried circumferentially all the way to the chest wall. Next the breast was removed from the pectoralis muscle with the pectoralis fascia. Once this was accomplished the breast was removed from the patient. The breast was marked with a  stitch on the lateral skin and sent to pathology. The wound was irrigated with copious amounts of saline and packed with a moistened lap sponge. Attention was then turned to the left breast. A similar elliptical incision was made around the nipple and areola complex with a 10 blade knife. The incision was carried through the skin and subcutaneous tissue sharply with the photon blade. Breast hooks were used to elevate skin flaps anteriorly towards the ceiling. Thin skin flaps were then created circumferentially using the photon blade was dissection between the subcutaneous fat and breast tissue. This dissection was carried all the way to the chest wall circumferentially. Next the breast was removed from the pectoralis muscle with the pectoralis fascia. Once this was accomplished the breast was removed from the patient and the breast was marked with a stitch on the lateral skin. The wound was irrigated with copious amounts of saline. Hemostasis was achieved using the photon blade. Next a small stab incision was made inferior to the operative bed near the anterior axillary line on each side with a 15 blade knife. A tonsil clamp was placed through each opening and used to bring a 19 [place] round Blake drain into the operative bed. The drain on both sides was cut along the chest wall. Each drain was anchored to the skin with a 2-0 nylon stitch. Next the superior and inferior skin flaps were grossly reapproximated with interrupted 3-0 Vicryl stitches. The skin incision was then closed on each side with a running 4-0 Monocryl subcuticular stitch. Dermabond dressings were applied. The patient tolerated the procedure well. At the end of the case all needle sponge counts  were correct. The patient was then awakened and taken to recovery in stable condition. The drains were placed to bulb suction and there was a good seal.  PLAN OF CARE: Admit for overnight observation  PATIENT DISPOSITION:  PACU - hemodynamically stable.    Delay start of Pharmacological VTE agent (>24hrs) due to surgical blood loss or risk of bleeding: no

## 2016-03-08 NOTE — Transfer of Care (Signed)
Immediate Anesthesia Transfer of Care Note  Patient: Dawn Boyd  Procedure(s) Performed: Procedure(s): BILATERAL MASTECTOMIES (Bilateral)  Patient Location: PACU  Anesthesia Type:GA combined with regional for post-op pain  Level of Consciousness: awake, alert  and oriented  Airway & Oxygen Therapy: Patient Spontanous Breathing and Patient connected to nasal cannula oxygen  Post-op Assessment: Report given to RN, Post -op Vital signs reviewed and stable and Patient moving all extremities X 4  Post vital signs: Reviewed and stable  Last Vitals:  Vitals:   03/08/16 0712  BP: (!) 141/75  Pulse: 72  Resp: 18  Temp: 36.7 C    Last Pain: There were no vitals filed for this visit.    Patients Stated Pain Goal: 2 (62/22/97 9892)  Complications: No apparent anesthesia complications

## 2016-03-09 ENCOUNTER — Encounter (HOSPITAL_COMMUNITY): Payer: Self-pay | Admitting: General Surgery

## 2016-03-09 DIAGNOSIS — Z4001 Encounter for prophylactic removal of breast: Secondary | ICD-10-CM | POA: Diagnosis not present

## 2016-03-09 MED ORDER — HYDROCODONE-ACETAMINOPHEN 5-325 MG PO TABS: 1.0000 | ORAL_TABLET | ORAL | 0 refills | Status: DC | PRN

## 2016-03-09 MED ORDER — ROPIVACAINE HCL 7.5 MG/ML IJ SOLN
INTRAMUSCULAR | Status: DC | PRN
  Administered 2016-03-08 (×2): 20 mL via PERINEURAL

## 2016-03-09 MED ORDER — PANTOPRAZOLE SODIUM 40 MG PO TBEC: 40.0000 mg | DELAYED_RELEASE_TABLET | Freq: Every day | ORAL | Status: DC

## 2016-03-09 NOTE — Progress Notes (Signed)
1 Day Post-Op  Subjective: Having a little pain laterally on the right but otherwise feels pretty good  Objective: Vital signs in last 24 hours: Temp:  [97.5 F (36.4 C)-98.4 F (36.9 C)] 97.9 F (36.6 C) (02/22 0521) Pulse Rate:  [50-97] 55 (02/22 0521) Resp:  [10-16] 16 (02/22 0521) BP: (100-135)/(57-77) 100/58 (02/22 0521) SpO2:  [98 %-100 %] 98 % (02/22 0521) Last BM Date: 03/08/16  Intake/Output from previous day: 02/21 0701 - 02/22 0700 In: 2122.5 [P.O.:300; I.V.:1822.5] Out: 2100 [Urine:1950; Drains:100; Blood:50] Intake/Output this shift: Total I/O In: 240 [P.O.:240] Out: 550 [Urine:550]  Resp: clear to auscultation bilaterally Chest wall: skin flaps look good Cardio: regular rate and rhythm GI: soft, non-tender; bowel sounds normal; no masses,  no organomegaly  Lab Results:  No results for input(s): WBC, HGB, HCT, PLT in the last 72 hours. BMET No results for input(s): NA, K, CL, CO2, GLUCOSE, BUN, CREATININE, CALCIUM in the last 72 hours. PT/INR No results for input(s): LABPROT, INR in the last 72 hours. ABG No results for input(s): PHART, HCO3 in the last 72 hours.  Invalid input(s): PCO2, PO2  Studies/Results: No results found.  Anti-infectives: Anti-infectives    Start     Dose/Rate Route Frequency Ordered Stop   03/08/16 0700  ceFAZolin (ANCEF) IVPB 2g/100 mL premix     2 g 200 mL/hr over 30 Minutes Intravenous On call to O.R. 03/08/16 0700 03/08/16 0855      Assessment/Plan: s/p Procedure(s): BILATERAL MASTECTOMIES (Bilateral) Advance diet Discharge  LOS: 0 days    TOTH III,Reyan Helle S 03/09/2016

## 2016-03-09 NOTE — Anesthesia Postprocedure Evaluation (Addendum)
Anesthesia Post Note  Patient: Jamaris Biernat  Procedure(s) Performed: Procedure(s) (LRB): BILATERAL MASTECTOMIES (Bilateral)  Patient location during evaluation: PACU Anesthesia Type: Regional Level of consciousness: awake and alert Pain management: pain level controlled Vital Signs Assessment: post-procedure vital signs reviewed and stable Respiratory status: spontaneous breathing, nonlabored ventilation, respiratory function stable and patient connected to nasal cannula oxygen Cardiovascular status: blood pressure returned to baseline and stable Postop Assessment: no signs of nausea or vomiting Anesthetic complications: no       Last Vitals:  Vitals:   03/09/16 0205 03/09/16 0521  BP: (!) 100/57 (!) 100/58  Pulse: (!) 50 (!) 55  Resp: 15 16  Temp: 36.7 C 36.6 C    Last Pain:  Vitals:   03/09/16 0956  TempSrc:   PainSc: 9                  Thadd Apuzzo

## 2016-03-09 NOTE — Anesthesia Procedure Notes (Signed)
Anesthesia Regional Block: Pectoralis block   Pre-Anesthetic Checklist: ,, timeout performed, Correct Patient, Correct Site, Correct Laterality, Correct Procedure, Correct Position, site marked, Risks and benefits discussed,  Surgical consent,  Pre-op evaluation,  At surgeon's request and post-op pain management  Laterality: Right  Prep: chloraprep       Needles:  Injection technique: Single-shot  Needle Type: Echogenic Stimulator Needle          Additional Needles:   Procedures: ultrasound guided,,,,,,,,  Narrative:  Start time: 03/08/2016 8:11 AM End time: 03/08/2016 8:21 AM Injection made incrementally with aspirations every 5 mL.  Performed by: Personally  Anesthesiologist: Letcher Schweikert  Additional Notes: H+P and labs reviewed, risks and benefits discussed with patient, procedure tolerated well without complications

## 2016-03-09 NOTE — Discharge Planning (Signed)
JP drain care education completed. Pt verbalizes understanding of how to appropriately empty drains, recharge and milk tubing. She also understands the importance of keeping a record of how much output comes from each drain.

## 2016-03-09 NOTE — Discharge Summary (Signed)
Physician Discharge Summary  Patient ID: Dawn Boyd MRN: [id number] DOB/AGE: [birth year] 46 y.o.  Admit date: 03/08/2016 Discharge date: 03/09/2016  Admission Diagnoses:  Discharge Diagnoses:  Active Problems:   BRCA2 positive   Discharged Condition: good  Hospital Course: the patient underwent bilateral mastectomies. She tolerated surgery well. On pod 1 she was ready for discharge home  Consults: None  Significant Diagnostic Studies: none  Treatments: surgery: as above  Discharge Exam: Blood pressure (!) 100/58, pulse (!) 55, temperature 97.9 F (36.6 C), temperature source Oral, resp. rate 16, height _0  (1.6 m), weight 58.8 kg (129 lb 9.6 oz), last menstrual period 11/01/2010, SpO2 98 %. Resp: clear to auscultation bilaterally Chest wall: skin flaps look good Cardio: regular rate and rhythm GI: soft, non-tender; bowel sounds normal; no masses,  no organomegaly  Disposition: 01-Home or Self Care  Discharge Instructions    Call MD for:  difficulty breathing, headache or visual disturbances    Complete by:  As directed    Call MD for:  extreme fatigue    Complete by:  As directed    Call MD for:  hives    Complete by:  As directed    Call MD for:  persistant dizziness or light-headedness    Complete by:  As directed    Call MD for:  persistant nausea and vomiting    Complete by:  As directed    Call MD for:  redness, tenderness, or signs of infection (pain, swelling, redness, odor or green/yellow discharge around incision site)    Complete by:  As directed    Call MD for:  severe uncontrolled pain    Complete by:  As directed    Call MD for:  temperature >100.4    Complete by:  As directed    Diet - low sodium heart healthy    Complete by:  As directed    Discharge instructions    Complete by:  As directed    Sponge bathe while drains are in. No overhead activity. Diet as tolerated. Empty drains, record output, and recharge bulb twice a day   Increase activity  slowly    Complete by:  As directed    No wound care    Complete by:  As directed      Allergies as of 03/09/2016      Reactions   Lactose Intolerance (gi) Diarrhea, Nausea Only, Other (See Comments)   Bloating   Tape Dermatitis   Skin redness from the glue.      Medication List    TAKE these medications   aspirin-acetaminophen-caffeine 250-250-65 MG tablet Commonly known as:  EXCEDRIN MIGRAINE Take 1 tablet by mouth daily as needed for migraine.   HYDROcodone-acetaminophen 5-325 MG tablet Commonly known as:  NORCO/VICODIN Take 1-2 tablets by mouth every 4 (four) hours as needed for moderate pain or severe pain.   ibuprofen 200 MG tablet Commonly known as:  ADVIL,MOTRIN Take 200 mg by mouth as needed for headache.   naratriptan 2.5 MG tablet Commonly known as:  AMERGE Take 1.25 mg by mouth daily.      Follow-up Information    TOTH III,Sachiko Methot S, MD Follow up in 1 week(s).   Specialty:  General Surgery Contact information: [address] [place] [address] [phone]           Signed: Merrie Roof 03/09/2016, 12:16 PM

## 2016-03-09 NOTE — Anesthesia Procedure Notes (Signed)
Anesthesia Regional Block: Pectoralis block   Pre-Anesthetic Checklist: ,, timeout performed, Correct Patient, Correct Site, Correct Laterality, Correct Procedure, Correct Position, site marked, Risks and benefits discussed,  Surgical consent,  Pre-op evaluation,  At surgeon's request and post-op pain management  Laterality: Left  Prep: chloraprep       Needles:  Injection technique: Single-shot  Needle Type: Echogenic Stimulator Needle          Additional Needles:   Procedures: ultrasound guided,,,,,,,,   Nerve Stimulator or Paresthesia:  Response: plantar flexion,   Additional Responses:   Narrative:  Start time: 03/08/2016 8:11 AM End time: 03/08/2016 8:21 AM  Performed by: Personally  Anesthesiologist: Seona Clemenson  Additional Notes: A functioning IV was confirmed and monitors were applied.  Sterile prep and drape, hand hygiene and sterile gloves were used.  Negative aspiration and test dose prior to incremental administration of local anesthetic. The patient tolerated the procedure well.Ultrasound  guidance: relevant anatomy identified, needle position confirmed, local anesthetic spread visualized around nerve(s), vascular puncture avoided.  Image printed for medical record.

## 2016-03-09 NOTE — Discharge Planning (Signed)
Patient discharged home in stable condition. Verbalizes understanding of all discharge instructions, including home medications and follow up appointments. 

## 2016-03-26 IMAGING — CT CT ABDOMEN WO/W CM
2 of 10 series · 12 of 46 positions shown, 19 images · IV contrast (Omnipaque 300)
Comparison: Multiple exams, including 08/08/2013

CLINICAL DATA: Pancreatic cancer screening. BRCA mutation. Upper
abdominal pain and diarrhea.

EXAM:
CT ABDOMEN WITHOUT AND WITH CONTRAST
TECHNIQUE: Multidetector CT imaging of the abdomen was performed following the
standard protocol before and following the bolus administration of
intravenous contrast.
CONTRAST:  100mL OMNIPAQUE IOHEXOL 300 MG/ML  SOLN

[Series 10: portal_venous thin · axial · 0.62mm/px · z∈[-294,-87]mm · 9 of 137 slices shown, 15 images]
[im 14/137  soft-tissue]
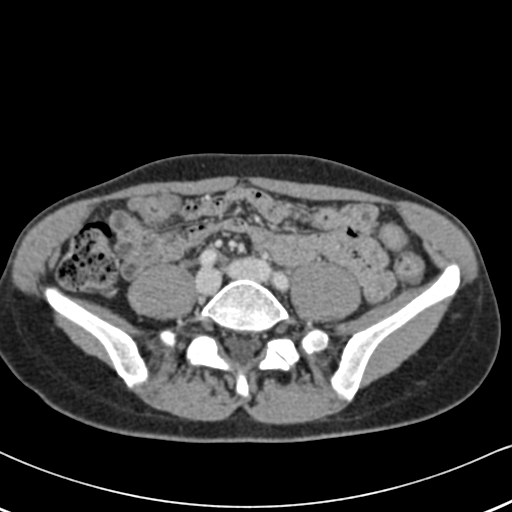
[im 14/137  bone]
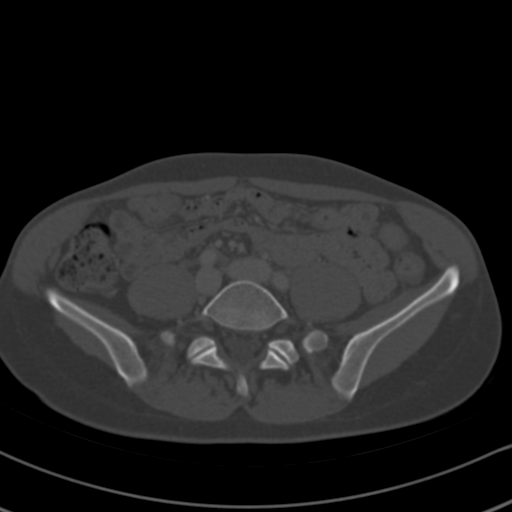
[im 28/137  soft-tissue]
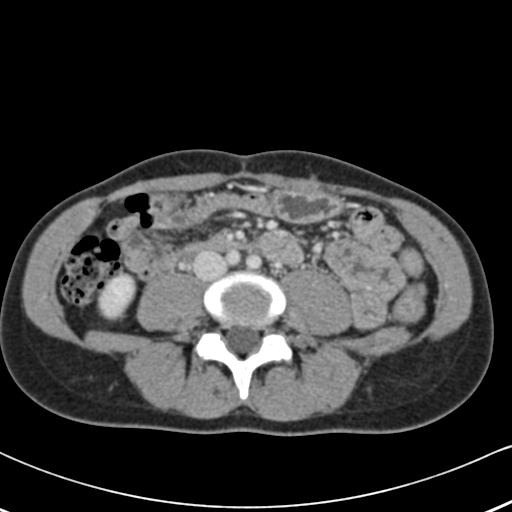
[im 41/137  soft-tissue]
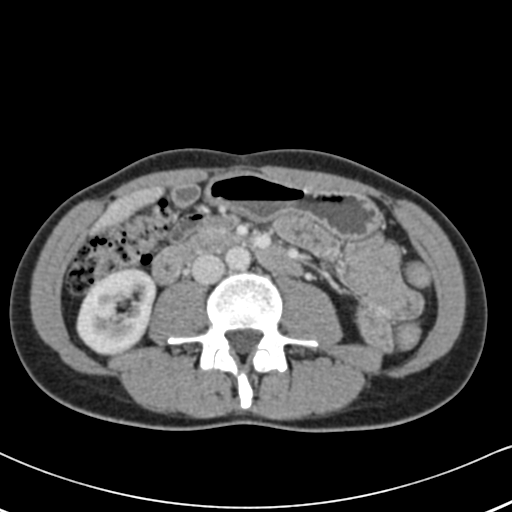
[im 55/137  soft-tissue]
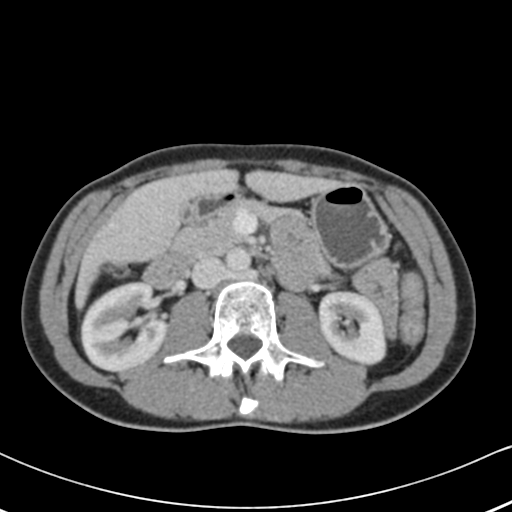
[im 69/137  soft-tissue]
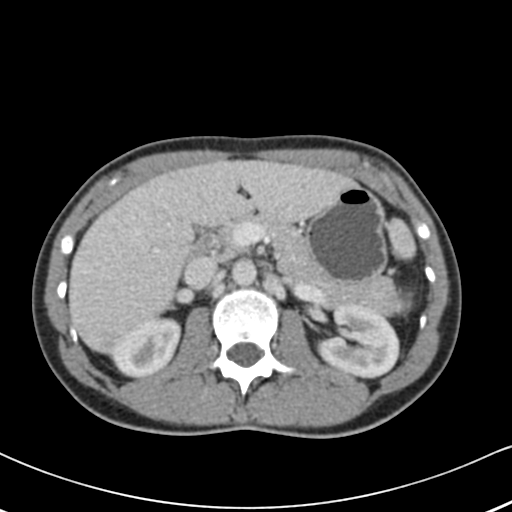
[im 82/137  soft-tissue]
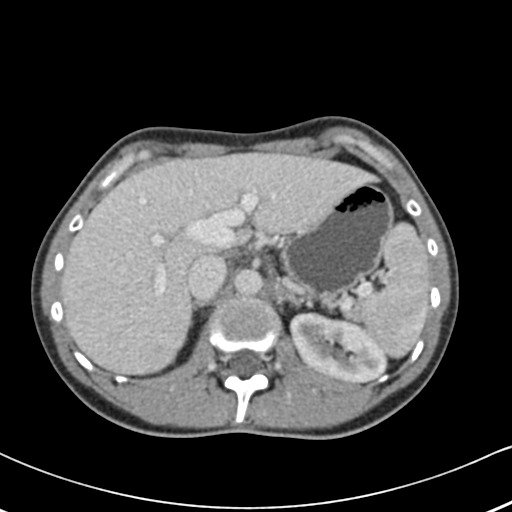
[im 82/137  lung]
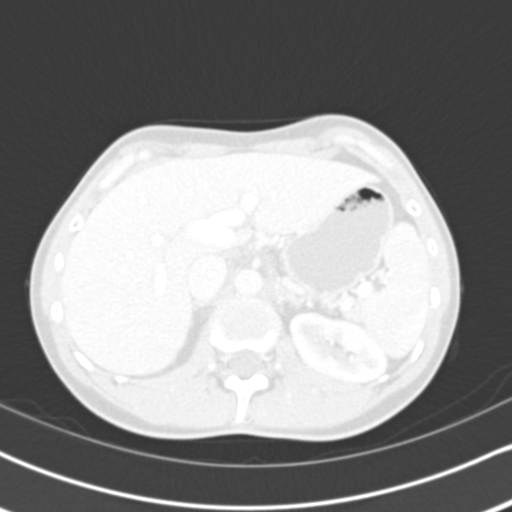
[im 96/137  soft-tissue]
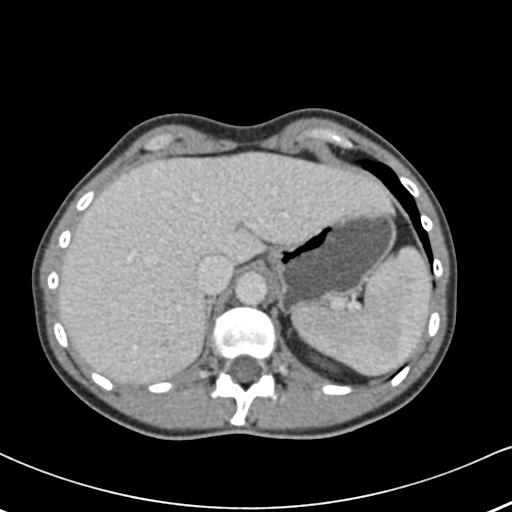
[im 96/137  lung]
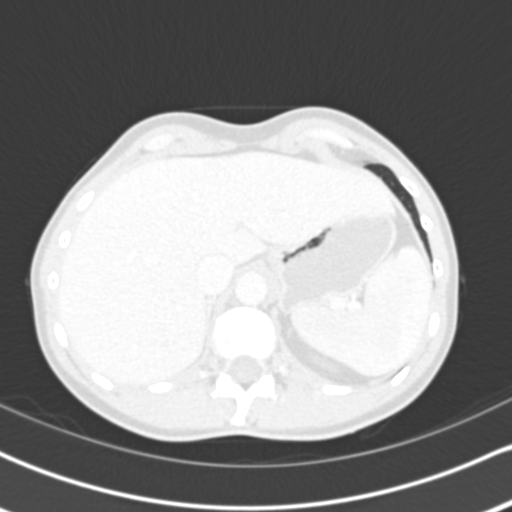
[im 109/137  soft-tissue]
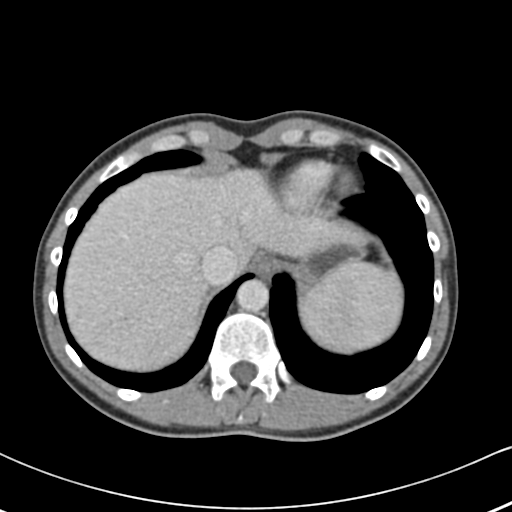
[im 109/137  lung]
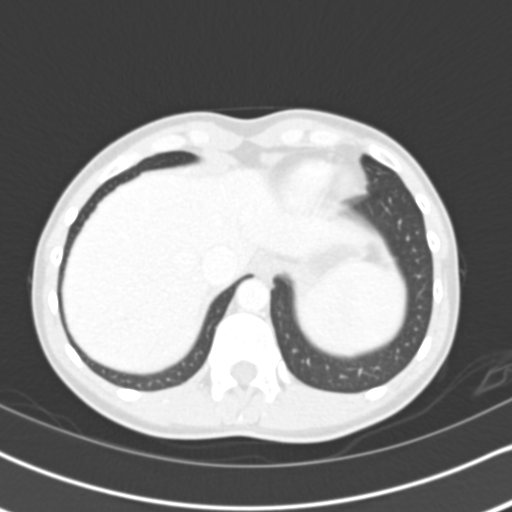
[im 123/137  soft-tissue]
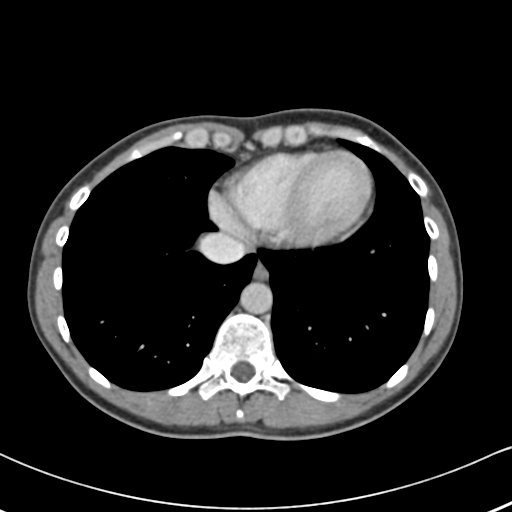
[im 123/137  lung]
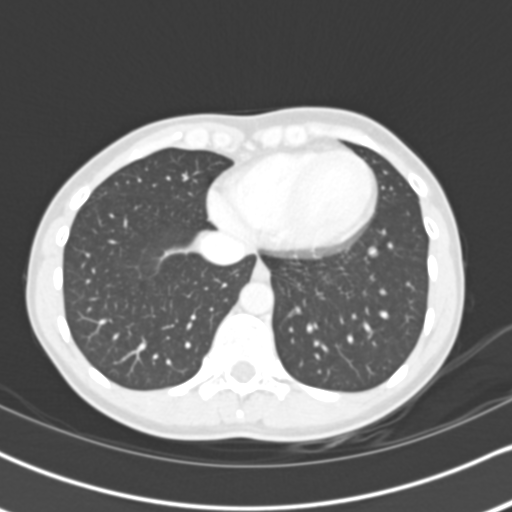
[im 123/137  bone]
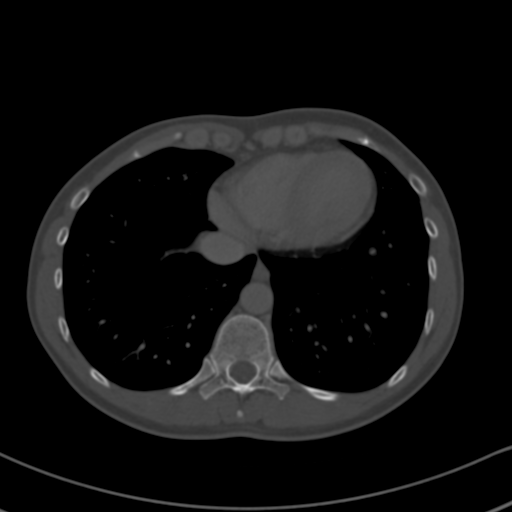

[Series 602: <mpr thick range> · coronal · 0.62mm/px · 3 of 92 slices shown, 4 images]
[im 23/92  soft-tissue]
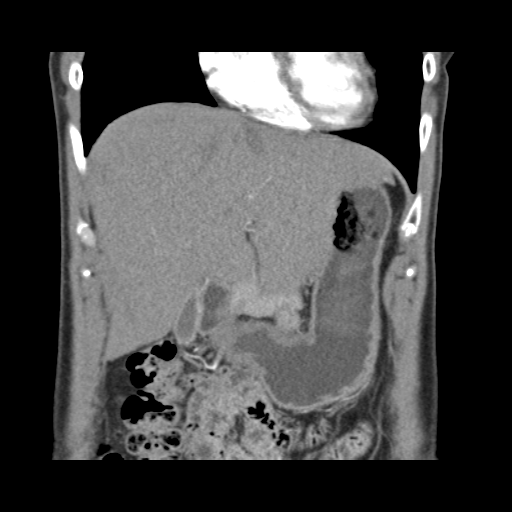
[im 46/92  soft-tissue]
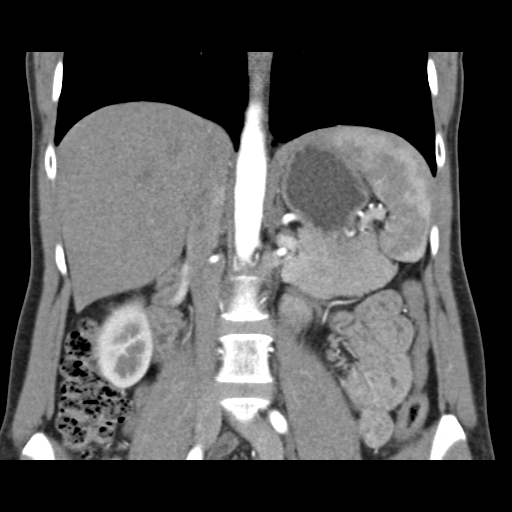
[im 46/92  bone]
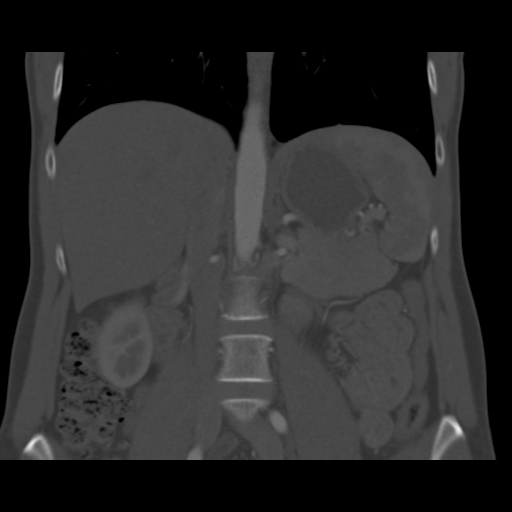
[im 69/92  soft-tissue]
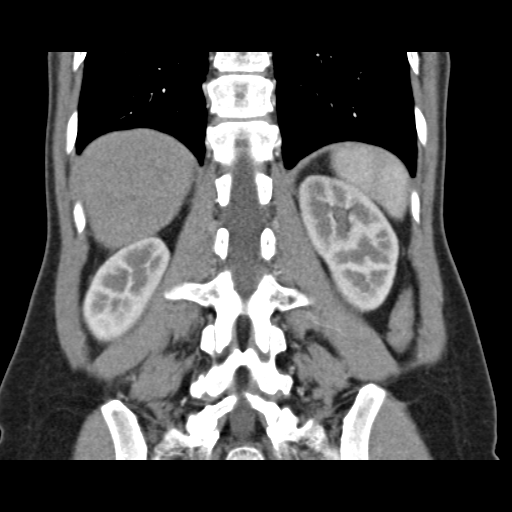

[12 of 46 positions shown; findings below may reference images not displayed]

FINDINGS: No renal calculi observed on the noncontrast images.

On arterial phase images, no hypoenhancing pancreatic lesion is
observed to suggest pancreatic cancer. Dorsal pancreatic duct
normal. No pancreas divisum.

On portal venous and delayed phase images, the enhancement pattern
of the pancreas remains normal. No findings of concern for
pancreatic cancer.

The liver, spleen, and adrenal glands appear normal. However, on
image 25 of series 5 and image 52 of series 602, we demonstrate a
1.2 cm splenic artery aneurysm in the upper splenic hilum. The
gallbladder is mildly contracted but otherwise normal in appearance.

No pathologic upper abdominal adenopathy is observed. Kidneys and
proximal ureters unremarkable. Visualized upper abdominal bowel
normal. No significant atherosclerotic calcification is observed.

Minimal disc bulge at L5-S1, without impingement.
IMPRESSION: 1. No pancreatic abnormality.
2. 1.2 cm splenic artery aneurysm along the splenic hilum.
3. Minimal disc bulge at L5-S1, without impingement.

## 2016-04-12 ENCOUNTER — Ambulatory Visit: Payer: 59 | Attending: General Surgery | Admitting: Physical Therapy

## 2016-04-12 DIAGNOSIS — M25511 Pain in right shoulder: Secondary | ICD-10-CM | POA: Insufficient documentation

## 2016-04-12 DIAGNOSIS — M25611 Stiffness of right shoulder, not elsewhere classified: Secondary | ICD-10-CM | POA: Insufficient documentation

## 2016-04-12 DIAGNOSIS — M25512 Pain in left shoulder: Secondary | ICD-10-CM | POA: Insufficient documentation

## 2016-04-12 DIAGNOSIS — M25612 Stiffness of left shoulder, not elsewhere classified: Secondary | ICD-10-CM | POA: Diagnosis present

## 2016-04-12 NOTE — Therapy (Signed)
[place], [place], [address] Phone: [phone]   Fax:  [phone]  Physical Therapy Evaluation  Patient Details  Name: Dawn Boyd MRN: [id number] Date of Birth: Nov 08, 1970 Referring Provider: Dr. Autumn Messing  Encounter Date: 04/12/2016      PT End of Session - 04/12/16 1155    Visit Number 1   Number of Visits 9   Date for PT Re-Evaluation 05/15/16   PT Start Time 1105   PT Stop Time 1153   PT Time Calculation (min) 48 min   Activity Tolerance Patient tolerated treatment well   Behavior During Therapy [facility] for tasks assessed/performed      Past Medical History:  Diagnosis Date  . Allergy   . Anxiety    situational  . Arthritis    "clicking in the neck" on occasion   . Blood dyscrasia    thrombocytopenia-age 46, resolved, told that she is completely cured    . Blood transfusion    pt states age 89 in [place] had problems with blood clotting, no further problems  . Blood transfusion without reported diagnosis   . BRCA2 positive 09/08/2011   pt. has hx./ mother has hx. of ovarian cancer-no cancers for patient  . Bronchitis   . Clotting disorder ([place])    THROMOCYTOPENIA  AGE 42  . Depression    AFTER CHILD BIRTH  . Gallbladder polyp   . GERD (gastroesophageal reflux disease)    tums prn  . Gilbert's syndrome    "Liver doesn't process bilirubin"  . Headache(784.0)    migraines-usually once weekly, really  bad q month or two  . Heart murmur    no problems, states she has been told that it is not audible at all times   . IBS (irritable bowel syndrome)   . Osteopenia    MILD  . PONV (postoperative nausea and vomiting)   . Seasonal allergies   . 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 Voiding difficulty    history of post procedure difficulty voiding    Past Surgical History:  Procedure Laterality Date  . ABDOMINAL HYSTERECTOMY    . LAPAROSCOPIC ASSISTED VAGINAL  HYSTERECTOMY  11/22/2010   Procedure: LAPAROSCOPIC ASSISTED VAGINAL HYSTERECTOMY;  Surgeon: Arloa Koh;  Location: [facility];  Service: Gynecology;  Laterality: N/A;  . LAPAROSCOPIC SPLENECTOMY N/A 10/28/2013   Procedure: LAPAROSCOPIC SPLENECTOMY;  Surgeon: Coralie Keens, MD;  Location: [facility];  Service: General;  Laterality: N/A;  . MASTECTOMY Bilateral 03/08/2016    BRCA2 mutation   . NASAL SEPTUM SURGERY  2007  . RADIOLOGY WITH ANESTHESIA N/A 09/29/2013   Procedure: EMBOLIZATION ;  Surgeon: Azzie Roup, MD;  Location: [facility];  Service: Radiology;  Laterality: N/A;  . SALPINGOOPHORECTOMY  11/22/2010   Procedure: SALPINGO OOPHERECTOMY;  Surgeon: Arloa Koh;  Location: [facility];  Service: Gynecology;  Laterality: Bilateral;  . TOTAL MASTECTOMY Bilateral 03/08/2016   Procedure: BILATERAL MASTECTOMIES;  Surgeon: Autumn Messing III, MD;  Location: [facility];  Service: General;  Laterality: Bilateral;  . VAGINAL DELIVERY     x2's     There were no vitals filed for this visit.       Subjective Assessment - 04/12/16 1107    Subjective "I had a prophylactic mastectomy and we have a high cancer ratein our family; I have a BRCA2 gene and another gene.  I had a hysterectomy--my mom is an ovarian cancer survivor.  It took me a  little time to decide about the breast."  I didn't have a reconstruction; I didn't want to have another surgery.   Pertinent History Bilateral prophylactic mastectomies 03/08/16 and patient isn't sure if they took out any lymph nodes, but doesn't think so. Pathology was negative.  Hysterectomy 2012. Migraines.  Also gets neck tightness, probably muscular.  IBS/GERD. Nothing else major. Had right shoulder bursitis years ago and had cortisone shot, then exercise at home for this; this comes back once in a while. Splenectomy two years ago due to splenic artery aneurysms.   Patient Stated Goals "I would like to get back to tennis."    Currently in Pain? No/denies   Pain Score 9    Pain  Location Chest   Pain Orientation Right;Left  right > left   Pain Descriptors / Indicators Squeezing;Tightness   Pain Type Surgical pain   Pain Onset 1 to 4 weeks ago   Aggravating Factors  reaching some directions   Pain Relieving Factors arm at rest            [facility] PT Assessment - 04/12/16 0001      Assessment   Medical Diagnosis prophylactic mastectomies for BRCA2 positivity   Referring Provider Dr. Autumn Messing   Onset Date/Surgical Date 03/08/16   Hand Dominance Right   Prior Therapy none     Precautions   Precautions Other (comment);None     Restrictions   Weight Bearing Restrictions No     Balance Screen   Has the patient fallen in the past 6 months No   Has the patient had a decrease in activity level because of a fear of falling?  No   Is the patient reluctant to leave their home because of a fear of falling?  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  tennis season is starting now     Cognition   Overall Cognitive Status Within Functional Limits for tasks assessed     Observation/Other Assessments   Skin Integrity intact; incisions well healed     Posture/Postural Control   Posture/Postural Control Postural limitations   Postural Limitations Forward head;Rounded Shoulders  slouches     ROM / Strength   AROM / PROM / Strength AROM;PROM;Strength     AROM   AROM Assessment Site Shoulder   Right/Left Shoulder Right;Left   Right Shoulder Extension 59 Degrees   Right Shoulder Flexion 141 Degrees  gets pulling at right upper chest   Right  Shoulder ABduction 149 Degrees  pulling at right pect   Right Shoulder Internal Rotation 69 Degrees  in sitting   Right Shoulder External Rotation 95 Degrees   Right Shoulder Horizontal ABduction 29 Degrees   Left Shoulder Extension 65 Degrees   Left Shoulder Flexion 147 Degrees  gets pulling down left arm   Left Shoulder ABduction 170 Degrees  little pulling at pect   Left Shoulder Internal Rotation 65 Degrees   Left Shoulder External Rotation 89 Degrees   Left Shoulder Horizontal ABduction 27 Degrees     Strength  Overall Strength Comments both shoulder strength grossly 5/5     Palpation   Palpation comment mild-moderately limited soft tissue mobility around mastectomy sites; significantly limited mobility around one drain site on each side inferior to mastectomies; cording palpable running from just below right mastectomy incision inferior                                   Long Term Clinic Goals - 04/12/16 1207      CC Long Term Goal  #1   Title Independent with HEP for shoulder and trunk stretches and ready to return to playing tennis   Time 4   Period Weeks   Status New     CC Long Term Goal  #2   Title Both shoulder active abduction to 180 degrees for improved ADLs   Time 4   Period Weeks   Status New     CC Long Term Goal  #3   Title both shoulder active flexion to 165 degrees for improved overhead reach   Time 4   Period Weeks   Status New     CC Long Term Goal  #4   Title Pt. will report at least 75% decrease in discomfort when she goes to raise her right arm to reach into a cupboard or the like   Time 4   Period Weeks   Status New     CC Long Term Goal  #5   Title Pt. will be able to mimic a tennis serve without pain   Time 4   Period Weeks   Status New            Plan - 04/12/16 1156    Clinical Impression Statement This is a very pleasant young woman s/p bilateral prophylactic mastectomies because of family history  and BRCA2 positivity.  Surgery was 03/08/16 and it doesn't look like she had lymph nodes removed; she will not have reconstruction.  She has limited AROM in both shoulders with some soft tissue tightness around incisions and especially at drain sites on each side.  She has some cording running inferior to right mastectomy incision that is palpable.  She has had a splenectomy and a hysterectomy; the former scar at left abdomen is somewhat immobile as well.  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  Currently raising the right arm in particular is uncomfortable.  Eval is moderate complexity with h/o multiple surgeries that resulted in soft tissue tightness and evolving as she is just healing from bilateral mastectomies.   Rehab Potential Excellent   PT Frequency 2x / week   PT Duration 4 weeks   PT Treatment/Interventions ADLs/Self Care Home Management;Therapeutic activities;Therapeutic exercise;Patient/family education;Manual techniques;Manual lymph drainage;Scar mobilization;Passive range of motion   PT Next Visit Plan Begin HEP instruction to include pect stretches such as over a towel roll; myofascial release, soft tissue and scar mobilization, especially at drain sites as well as mastectomy sites; mobilization of cording that is just inferior to right mastectomy scar.  Later, work toward return to tennis strokes and assist with getting her ready to work with Education officer, environmental.   Consulted and Agree with Plan of Care Patient      Patient will benefit from skilled therapeutic intervention in order to improve the following deficits and impairments:  Decreased range of motion, Impaired UE functional use, Postural dysfunction, Decreased scar mobility, Increased fascial restricitons  Visit Diagnosis: Stiffness of right shoulder, not elsewhere classified - Plan: PT plan of care cert/re-cert  Stiffness of left  shoulder, not elsewhere classified - Plan: PT plan of care cert/re-cert  Acute pain of right shoulder - Plan: PT plan of care cert/re-cert  Acute pain of left shoulder - Plan: PT plan of care cert/re-cert     Problem List Patient Active Problem List   Diagnosis Date Noted  . Migraine without aura and without status migrainosus, not intractable 02/28/2016  . S/P splenectomy 10/28/2013  . Splenic artery aneurysm ([place]) 09/04/2013  . BRCA2 positive 09/08/2011    Patty Leitzke 04/12/2016, 12:12 PM  [place], [place], [address] Phone: [phone]   Fax:  [phone]  Name: Dawn Boyd MRN: [id number] Date of Birth: August 03, 1970  Serafina Royals, PT 04/12/16 12:13 PM

## 2016-04-18 ENCOUNTER — Ambulatory Visit: Payer: 59 | Attending: General Surgery

## 2016-04-18 DIAGNOSIS — M25511 Pain in right shoulder: Secondary | ICD-10-CM | POA: Diagnosis present

## 2016-04-18 DIAGNOSIS — M25512 Pain in left shoulder: Secondary | ICD-10-CM | POA: Insufficient documentation

## 2016-04-18 DIAGNOSIS — M25611 Stiffness of right shoulder, not elsewhere classified: Secondary | ICD-10-CM | POA: Diagnosis not present

## 2016-04-18 DIAGNOSIS — M25612 Stiffness of left shoulder, not elsewhere classified: Secondary | ICD-10-CM | POA: Insufficient documentation

## 2016-04-18 NOTE — Patient Instructions (Signed)
Pectoralis Stretch With Scapula Adduction: Supine (Towel Roll)    Lie with rolled towel under spine vertically. Gently squeeze shoulder blades together. Do _10__ times, _1-2__ times per day.   Also on towel roll: 1. Lay with arms out to sides (arms resting on floor) for 2-4 minutes (bring arms to sides until gentle stretch at chest felt) 2. Then raise arms to have hands pointing at ceiling and open arms wide touching floor, then bring hands back together keeping elbows straight throughout, should feel stretch at chest. 10 times 3. Then bring hands together over waist thinking of this as the bottom of the "V", bring arms up and out to make the legs of the "V", raising arms until gentle stretch is felt. 10 times.  Over Head Pull: Narrow and Wide Grip   Cancer Rehab [phone]   On back, knees bent, feet flat, band across thighs, elbows straight but relaxed. Pull hands apart (start). Keeping elbows straight, bring arms up and over head, hands toward floor. Keep pull steady on band. Hold momentarily. Return slowly, keeping pull steady, back to start. Then do same with a wider grip on the band (past shoulder width) Repeat _10__ times. Band color __red____   Side Pull: Double Arm   On back, knees bent, feet flat. Arms perpendicular to body, shoulder level, elbows straight but relaxed. Pull arms out to sides, elbows straight. Resistance band comes across collarbones, hands toward floor. Hold momentarily. Slowly return to starting position. Repeat _10__ times. Band color __red____   Sword   On back, knees bent, feet flat, left hand on left hip, right hand above left. Pull right arm DIAGONALLY (hip to shoulder) across chest. Bring right arm along head toward floor. Hold momentarily. Slowly return to starting position. Repeat _10__ times. Do with left arm. Band color _red_____   Shoulder Rotation: Double Arm   On back, knees bent, feet flat, elbows tucked at sides, bent 90, hands palms up. Pull  hands apart and down toward floor, keeping elbows near sides. Hold momentarily. Slowly return to starting position. Repeat _10__ times. Band color __red____    Copyright  VHI. All rights reserved.   CHEST: Doorway, Bilateral - Standing    Standing in doorway with one foot in front of other, place hands on wall with elbows bent at shoulder height. Lean forward. Hold _10-20__ seconds. _3-5__ reps per set, _2-3__ sets per day.  Copyright  VHI. All rights reserved.

## 2016-04-18 NOTE — Therapy (Signed)
[place], [place], [address] Phone: [phone]   Fax:  [phone]  Physical Therapy Treatment  Patient Details  Name: Roxene Alviar MRN: [id number] Date of Birth: November 28, 1970 Referring Provider: Dr. Autumn Messing  Encounter Date: 04/18/2016      PT End of Session - 04/18/16 1205    Visit Number 2   Number of Visits 9   Date for PT Re-Evaluation 05/15/16   PT Start Time 1108   PT Stop Time 1157   PT Time Calculation (min) 49 min   Activity Tolerance Patient tolerated treatment well   Behavior During Therapy [facility] for tasks assessed/performed      Past Medical History:  Diagnosis Date  . Allergy   . Anxiety    situational  . Arthritis    "clicking in the neck" on occasion   . Blood dyscrasia    thrombocytopenia-age 86, resolved, told that she is completely cured    . Blood transfusion    pt states age 52 in [place] had problems with blood clotting, no further problems  . Blood transfusion without reported diagnosis   . BRCA2 positive 09/08/2011   pt. has hx./ mother has hx. of ovarian cancer-no cancers for patient  . Bronchitis   . Clotting disorder ([place])    THROMOCYTOPENIA  AGE 64  . Depression    AFTER CHILD BIRTH  . Gallbladder polyp   . GERD (gastroesophageal reflux disease)    tums prn  . Gilbert's syndrome    "Liver doesn't process bilirubin"  . Headache(784.0)    migraines-usually once weekly, really  bad q month or two  . Heart murmur    no problems, states she has been told that it is not audible at all times   . IBS (irritable bowel syndrome)   . Osteopenia    MILD  . PONV (postoperative nausea and vomiting)   . Seasonal allergies   . 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 Voiding difficulty    history of post procedure difficulty voiding    Past Surgical History:  Procedure Laterality Date  . ABDOMINAL HYSTERECTOMY    . LAPAROSCOPIC ASSISTED VAGINAL  HYSTERECTOMY  11/22/2010   Procedure: LAPAROSCOPIC ASSISTED VAGINAL HYSTERECTOMY;  Surgeon: Arloa Koh;  Location: [facility];  Service: Gynecology;  Laterality: N/A;  . LAPAROSCOPIC SPLENECTOMY N/A 10/28/2013   Procedure: LAPAROSCOPIC SPLENECTOMY;  Surgeon: Coralie Keens, MD;  Location: [facility];  Service: General;  Laterality: N/A;  . MASTECTOMY Bilateral 03/08/2016    BRCA2 mutation   . NASAL SEPTUM SURGERY  2007  . RADIOLOGY WITH ANESTHESIA N/A 09/29/2013   Procedure: EMBOLIZATION ;  Surgeon: Azzie Roup, MD;  Location: [facility];  Service: Radiology;  Laterality: N/A;  . SALPINGOOPHORECTOMY  11/22/2010   Procedure: SALPINGO OOPHERECTOMY;  Surgeon: Arloa Koh;  Location: [facility];  Service: Gynecology;  Laterality: Bilateral;  . TOTAL MASTECTOMY Bilateral 03/08/2016   Procedure: BILATERAL MASTECTOMIES;  Surgeon: Autumn Messing III, MD;  Location: [facility];  Service: General;  Laterality: Bilateral;  . VAGINAL DELIVERY     x2's     There were no vitals filed for this visit.      Subjective Assessment - 04/18/16 1117    Subjective No changes since I was here last except  feel like my chest tightness is improving each day. I just want to learn how to continue stretching so I can get back to tennis.    Pertinent History  Bilateral prophylactic mastectomies 03/08/16 and patient isn't sure if they took out any lymph nodes, but doesn't think so. Pathology was negative.  Hysterectomy 2012. Migraines.  Also gets neck tightness, probably muscular.  IBS/GERD. Nothing else major. Had right shoulder bursitis years ago and had cortisone shot, then exercise at home for this; this comes back once in a while. Splenectomy two years ago due to splenic artery aneurysms.   Patient Stated Goals "I would like to get back to tennis."    Currently in Pain? No/denies                         [facility] Adult PT Treatment/Exercise - 04/18/16 0001      Shoulder Exercises: Supine   Horizontal ABduction  AROM;Strengthening;Both;10 reps;Theraband;Other (comment)  First supine on towel roll, then with theraband 10x each   Theraband Level (Shoulder Horizontal ABduction) Level 2 (Red)   External Rotation Strengthening;Both;10 reps;Theraband   Theraband Level (Shoulder External Rotation) Level 2 (Red)   Flexion Strengthening;Both;10 reps;Theraband  Narrow and Wide grip 10 times each   Theraband Level (Shoulder Flexion) Level 2 (Red)   Other Supine Exercises First supine on towel roll with arms in abduction for bil pectoralis stretch x2 mins, then without towel bil D2 with red theraband 10 times each UE, pt returned correct therapist demo.     Shoulder Exercises: Stretch   Corner Stretch 3 reps;10 seconds  In doorway bil then each unilaterally     Manual Therapy   Manual Therapy Myofascial release;Passive ROM   Myofascial Release To Rt>Lt chest wall with cross hands technique horizontally and vertically   Passive ROM To Rt shoulder into abduction and D2 with diaganol myofascial stretch at Rt chest wall away from axilla to pts tolerance as this was reported to be tight and tender at incision.                PT Education - 04/18/16 1133    Education provided Yes   Education Details Pectoralis flexibility and postural strength   Person(s) Educated Patient   Methods Explanation;Demonstration;Handout   Comprehension Verbalized understanding;Returned demonstration                [facility] Goals - 04/12/16 1207      CC Long Term Goal  #1   Title Independent with HEP for shoulder and trunk stretches and ready to return to playing tennis   Time 4   Period Weeks   Status New     CC Long Term Goal  #2   Title Both shoulder active abduction to 180 degrees for improved ADLs   Time 4   Period Weeks   Status New     CC Long Term Goal  #3   Title both shoulder active flexion to 165 degrees for improved overhead reach   Time 4   Period Weeks   Status New     CC Long  Term Goal  #4   Title Pt. will report at least 75% decrease in discomfort when she goes to raise her right arm to reach into a cupboard or the like   Time 4   Period Weeks   Status New     CC Long Term Goal  #5   Title Pt. will be able to mimic a tennis serve without pain   Time 4   Period Weeks   Status New  Plan - 04/18/16 1205    Clinical Impression Statement Pt did very well with initial instruction of HEP to include pectoralis flexibility and postural strength. She was very eager to learn exercises to further her recovery, though did caution her to pace herself and not push into pain and rest if she feels fatigued. She verbalized understanding all this well and demonstrated good,safe technique with HEP. She tolerated manual therapy well today with minor c/o tenderness at Rt incision with myofascial release, also no cording palpable here today and pt reports hasn't felt it for a few days.    Rehab Potential Excellent   PT Frequency 2x / week   PT Duration 4 weeks   PT Next Visit Plan Review HEP and see if pt has any questions; cont myofascial release, soft tissue and scar mobilization, especially at drain sites as well as mastectomy sites.  Later, work toward return to tennis strokes and assist with getting her ready to work with Education officer, environmental.   Consulted and Agree with Plan of Care Patient      Patient will benefit from skilled therapeutic intervention in order to improve the following deficits and impairments:  Decreased range of motion, Impaired UE functional use, Postural dysfunction, Decreased scar mobility, Increased fascial restricitons  Visit Diagnosis: Stiffness of right shoulder, not elsewhere classified  Stiffness of left shoulder, not elsewhere classified  Acute pain of right shoulder  Acute pain of left shoulder     Problem List Patient Active Problem List   Diagnosis Date Noted  . Migraine without aura and without status migrainosus, not  intractable 02/28/2016  . S/P splenectomy 10/28/2013  . Splenic artery aneurysm ([place]) 09/04/2013  . BRCA2 positive 09/08/2011    Otelia Limes, PTA 04/18/2016, 12:10 PM  [place], [place], [address] Phone: [phone]   Fax:  [phone]  Name: Ashima Shrake MRN: [id number] Date of Birth: [birth year]

## 2016-04-21 ENCOUNTER — Ambulatory Visit: Payer: 59 | Admitting: Physical Therapy

## 2016-04-21 DIAGNOSIS — M25511 Pain in right shoulder: Secondary | ICD-10-CM

## 2016-04-21 DIAGNOSIS — M25612 Stiffness of left shoulder, not elsewhere classified: Secondary | ICD-10-CM

## 2016-04-21 DIAGNOSIS — M25512 Pain in left shoulder: Secondary | ICD-10-CM

## 2016-04-21 DIAGNOSIS — M25611 Stiffness of right shoulder, not elsewhere classified: Secondary | ICD-10-CM

## 2016-04-21 NOTE — Therapy (Signed)
[place], [place], [address] Phone: [phone]   Fax:  [phone]  Physical Therapy Treatment  Patient Details  Name: Dawn Boyd MRN: [id number] Date of Birth: 1970-07-16 Referring Provider: Dr. Autumn Messing  Encounter Date: 04/21/2016      PT End of Session - 04/21/16 1225    Visit Number 3   Number of Visits 9   Date for PT Re-Evaluation 05/15/16   PT Start Time 1105   PT Stop Time 1145   PT Time Calculation (min) 40 min   Activity Tolerance Patient tolerated treatment well   Behavior During Therapy [facility] for tasks assessed/performed      Past Medical History:  Diagnosis Date  . Allergy   . Anxiety    situational  . Arthritis    "clicking in the neck" on occasion   . Blood dyscrasia    thrombocytopenia-age 61, resolved, told that she is completely cured    . Blood transfusion    pt states age 68 in [place] had problems with blood clotting, no further problems  . Blood transfusion without reported diagnosis   . BRCA2 positive 09/08/2011   pt. has hx./ mother has hx. of ovarian cancer-no cancers for patient  . Bronchitis   . Clotting disorder ([place])    THROMOCYTOPENIA  AGE 3  . Depression    AFTER CHILD BIRTH  . Gallbladder polyp   . GERD (gastroesophageal reflux disease)    tums prn  . Gilbert's syndrome    "Liver doesn't process bilirubin"  . Headache(784.0)    migraines-usually once weekly, really  bad q month or two  . Heart murmur    no problems, states she has been told that it is not audible at all times   . IBS (irritable bowel syndrome)   . Osteopenia    MILD  . PONV (postoperative nausea and vomiting)   . Seasonal allergies   . 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 Voiding difficulty    history of post procedure difficulty voiding    Past Surgical History:  Procedure Laterality Date  . ABDOMINAL HYSTERECTOMY    . LAPAROSCOPIC ASSISTED VAGINAL  HYSTERECTOMY  11/22/2010   Procedure: LAPAROSCOPIC ASSISTED VAGINAL HYSTERECTOMY;  Surgeon: Arloa Koh;  Location: [facility];  Service: Gynecology;  Laterality: N/A;  . LAPAROSCOPIC SPLENECTOMY N/A 10/28/2013   Procedure: LAPAROSCOPIC SPLENECTOMY;  Surgeon: Coralie Keens, MD;  Location: [facility];  Service: General;  Laterality: N/A;  . MASTECTOMY Bilateral 03/08/2016    BRCA2 mutation   . NASAL SEPTUM SURGERY  2007  . RADIOLOGY WITH ANESTHESIA N/A 09/29/2013   Procedure: EMBOLIZATION ;  Surgeon: Azzie Roup, MD;  Location: [facility];  Service: Radiology;  Laterality: N/A;  . SALPINGOOPHORECTOMY  11/22/2010   Procedure: SALPINGO OOPHERECTOMY;  Surgeon: Arloa Koh;  Location: [facility];  Service: Gynecology;  Laterality: Bilateral;  . TOTAL MASTECTOMY Bilateral 03/08/2016   Procedure: BILATERAL MASTECTOMIES;  Surgeon: Autumn Messing III, MD;  Location: [facility];  Service: General;  Laterality: Bilateral;  . VAGINAL DELIVERY     x2's     There were no vitals filed for this visit.      Subjective Assessment - 04/21/16 1217    Subjective Pt feels that she is doing well and is continuing to heal.  she no longer feels the cording in her chest.  She is doing her exercises at home    Pertinent History Bilateral prophylactic mastectomies 03/08/16  and patient isn't sure if they took out any lymph nodes, but doesn't think so. Pathology was negative.  Hysterectomy 2012. Migraines.  Also gets neck tightness, probably muscular.  IBS/GERD. Nothing else major. Had right shoulder bursitis years ago and had cortisone shot, then exercise at home for this; this comes back once in a while. Splenectomy two years ago due to splenic artery aneurysms.   Patient Stated Goals "I would like to get back to tennis."    Currently in Pain? No/denies                         [facility] Adult PT Treatment/Exercise - 04/21/16 0001      Lumbar Exercises: Supine   Other Supine Lumbar Exercises alternating knee to chest for  core strengthening.     Lumbar Exercises: Quadruped   Opposite Arm/Leg Raise Right arm/Left leg;Left arm/Right leg;5 reps     Shoulder Exercises: Supine   Other Supine Exercises chest press with 3# x 10 reps      Shoulder Exercises: Standing   External Rotation Strengthening;Both;5 reps;Theraband   Theraband Level (Shoulder External Rotation) Level 3 (Green)   Internal Rotation Strengthening;Both;5 reps;Theraband   Theraband Level (Shoulder Internal Rotation) Level 3 (Green)   Flexion Strengthening;Both;5 reps;Theraband   Theraband Level (Shoulder Flexion) Level 3 (Green)   Extension Strengthening;Both;5 reps;Theraband   Theraband Level (Shoulder Extension) Level 3 (Green)   Row Strengthening;Both;5 reps;Theraband   Theraband Level (Shoulder Row) Level 4 (Blue)   Row Limitations cues to keep core engaged                 PT Education - 04/21/16 1216    Education provided Yes   Education Details [place] shoulder strengthening                 [facility] Goals - 04/12/16 1207      CC Long Term Goal  #1   Title Independent with HEP for shoulder and trunk stretches and ready to return to playing tennis   Time 4   Period Weeks   Status New     CC Long Term Goal  #2   Title Both shoulder active abduction to 180 degrees for improved ADLs   Time 4   Period Weeks   Status New     CC Long Term Goal  #3   Title both shoulder active flexion to 165 degrees for improved overhead reach   Time 4   Period Weeks   Status New     CC Long Term Goal  #4   Title Pt. will report at least 75% decrease in discomfort when she goes to raise her right arm to reach into a cupboard or the like   Time 4   Period Weeks   Status New     CC Long Term Goal  #5   Title Pt. will be able to mimic a tennis serve without pain   Time 4   Period Weeks   Status New            Plan - 04/21/16 1226    Clinical Impression Statement Pt continues to do very well with  stretching and ROM.  Upgraded home exercise to include strengthening to shoulders and core to prepare for her goal to return to tennis.    Rehab Potential Excellent   PT Frequency 2x / week   PT Duration 4 weeks   PT Treatment/Interventions ADLs/Self Care Home  Management;Therapeutic activities;Therapeutic exercise;Patient/family education;Manual techniques;Manual lymph drainage;Scar mobilization;Passive range of motion   PT Next Visit Plan Reassess for goals and for response to HEP.  Teach Strength ABC for good exercise program to follow at home. likely ready to discharge soon   Consulted and Agree with Plan of Care Patient      Patient will benefit from skilled therapeutic intervention in order to improve the following deficits and impairments:  Decreased range of motion, Impaired UE functional use, Postural dysfunction, Decreased scar mobility, Increased fascial restricitons  Visit Diagnosis: Stiffness of right shoulder, not elsewhere classified  Stiffness of left shoulder, not elsewhere classified  Acute pain of right shoulder  Acute pain of left shoulder     Problem List Patient Active Problem List   Diagnosis Date Noted  . Migraine without aura and without status migrainosus, not intractable 02/28/2016  . S/P splenectomy 10/28/2013  . Splenic artery aneurysm ([place]) 09/04/2013  . BRCA2 positive 09/08/2011   Donato Heinz. Owens Shark PT  Norwood Levo 04/21/2016, 12:31 PM  [place], [place], [address] Phone: [phone]   Fax:  [phone]  Name: Dawn Boyd MRN: [id number] Date of Birth: [birth year]

## 2016-04-21 NOTE — Patient Instructions (Signed)
Strengthening: Resisted Flexion    Cancer Rehab [phone]    Hold tubing with left arm at side. Pull forward and up. Move shoulder through pain-free range of motion. Repeat _5-10___ times per set. Do _1-2___ sessions per day.  Strengthening: Resisted Internal Rotation    Hold tubing in left hand, elbow at side and forearm out. Rotate forearm in across body. Repeat _5-10___ times per set. Do _1-2___ sessions per day.  Strengthening: Resisted Extension    Hold tubing in left hand, arm forward. Pull arm back, elbow straight. Repeat __5-10__ times per set. Do __1-2__ sessions per day.   Strengthening: Resisted External Rotation    Hold tubing in left hand, elbow at side and forearm across body. Rotate forearm out. Repeat _5-10___ times per set. Do __1-2__ sessions per day.    blue band around doorknob, pull both ends of band back, keep core tight, Then one at a time, then alternating   Lie on back, "bicycle" with leg straight and core tight

## 2016-04-26 ENCOUNTER — Ambulatory Visit: Payer: 59

## 2016-04-26 DIAGNOSIS — M25612 Stiffness of left shoulder, not elsewhere classified: Secondary | ICD-10-CM

## 2016-04-26 DIAGNOSIS — M25512 Pain in left shoulder: Secondary | ICD-10-CM

## 2016-04-26 DIAGNOSIS — M25611 Stiffness of right shoulder, not elsewhere classified: Secondary | ICD-10-CM | POA: Diagnosis not present

## 2016-04-26 DIAGNOSIS — M25511 Pain in right shoulder: Secondary | ICD-10-CM

## 2016-04-26 NOTE — Therapy (Addendum)
[place], [place], [address] Phone: [phone]   Fax:  [phone]  Physical Therapy Treatment  Patient Details  Name: Dawn Boyd MRN: [id number] Date of Birth: [birth year] Referring Provider: Dr. Autumn Messing  Encounter Date: 04/26/2016      PT End of Session - 04/26/16 1223    Visit Number 4   Number of Visits 9   Date for PT Re-Evaluation 05/15/16   PT Start Time 1110   PT Stop Time 1200   PT Time Calculation (min) 50 min   Activity Tolerance Patient tolerated treatment well   Behavior During Therapy [facility] for tasks assessed/performed      Past Medical History:  Diagnosis Date  . Allergy   . Anxiety    situational  . Arthritis    "clicking in the neck" on occasion   . Blood dyscrasia    thrombocytopenia-age 17, resolved, told that she is completely cured    . Blood transfusion    pt states age 10 in [place] had problems with blood clotting, no further problems  . Blood transfusion without reported diagnosis   . BRCA2 positive 09/08/2011   pt. has hx./ mother has hx. of ovarian cancer-no cancers for patient  . Bronchitis   . Clotting disorder ([place])    THROMOCYTOPENIA  AGE 1  . Depression    AFTER CHILD BIRTH  . Gallbladder polyp   . GERD (gastroesophageal reflux disease)    tums prn  . Gilbert's syndrome    "Liver doesn't process bilirubin"  . Headache(784.0)    migraines-usually once weekly, really  bad q month or two  . Heart murmur    no problems, states she has been told that it is not audible at all times   . IBS (irritable bowel syndrome)   . Osteopenia    MILD  . PONV (postoperative nausea and vomiting)   . Seasonal allergies   . 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 Voiding difficulty    history of post procedure difficulty voiding    Past Surgical History:  Procedure Laterality Date  . ABDOMINAL HYSTERECTOMY    . LAPAROSCOPIC ASSISTED VAGINAL  HYSTERECTOMY  11/22/2010   Procedure: LAPAROSCOPIC ASSISTED VAGINAL HYSTERECTOMY;  Surgeon: Arloa Koh;  Location: [facility];  Service: Gynecology;  Laterality: N/A;  . LAPAROSCOPIC SPLENECTOMY N/A 10/28/2013   Procedure: LAPAROSCOPIC SPLENECTOMY;  Surgeon: Coralie Keens, MD;  [facility];  Service: General;  Laterality: N/A;  . MASTECTOMY Bilateral 03/08/2016    BRCA2 mutation   . NASAL SEPTUM SURGERY  2007  . RADIOLOGY WITH ANESTHESIA N/A 09/29/2013   Procedure: EMBOLIZATION ;  Surgeon: Azzie Roup, MD;  Location: [facility];  Service: Radiology;  Laterality: N/A;  . SALPINGOOPHORECTOMY  11/22/2010   Procedure: SALPINGO OOPHERECTOMY;  Surgeon: Arloa Koh;  Location: [facility];  Service: Gynecology;  Laterality: Bilateral;  . TOTAL MASTECTOMY Bilateral 03/08/2016   Procedure: BILATERAL MASTECTOMIES;  Surgeon: Autumn Messing III, MD;  Location: [facility];  Service: General;  Laterality: Bilateral;  . VAGINAL DELIVERY     x2's     There were no vitals filed for this visit.      Subjective Assessment - 04/26/16 1114    Subjective (P)  Been doing all the exercises and they have been really helpful. I feel like I am ready to D/C today.    Pertinent History (P)  Bilateral prophylactic mastectomies 03/08/16 and patient isn't sure if they  took out any lymph nodes, but doesn't think so. Pathology was negative.  Hysterectomy 2012. Migraines.  Also gets neck tightness, probably muscular.  IBS/GERD. Nothing else major. Had right shoulder bursitis years ago and had cortisone shot, then exercise at home for this; this comes back once in a while. Splenectomy two years ago due to splenic artery aneurysms.   Patient Stated Goals (P)  "I would like to get back to tennis."    Currently in Pain? (P)  No/denies            [facility] PT Assessment - 04/26/16 0001      AROM   Right Shoulder Flexion 171 Degrees   Right Shoulder ABduction 168 Degrees   Left Shoulder Flexion 160 Degrees   Left Shoulder ABduction  170 Degrees                     [facility] Adult PT Treatment/Exercise - 04/26/16 0001      Self-Care   Self-Care Other Self-Care Comments   Other Self-Care Comments  Spent majority of session today ansewring pts questions and instructing her on safe ways to progress exercises we have given her and once she returns to playing tennins in the future.      Lumbar Exercises: Quadruped   Opposite Arm/Leg Raise Right arm/Left leg;Left arm/Right leg;5 reps   Opposite Arm/Leg Raise Limitations Focused on instructing pt proper starting position with core engaged to prevent lumbar extension and after initial trial she was then able to return correct demonstration.    Plank Therapist demonstrated this to pr who then was able to return correct demonstration as well with cuing for correct technique     Shoulder Exercises: Standing   Other Standing Exercises Therapist demonstrated correct starting technique for when she does UE exercises (scapular series) against wall: Lean against wall with back (by having core engaged), shoulders and head (with slight cervical retraction) against wall and proceed with exercises.                 PT Education - 04/26/16 1222    Education provided Yes   Education Details Verbally reviewed all exercises and answered pts questions regarding these. (had pt perform a few to assess technique, see flowsheet)   Person(s) Educated Patient   Methods Explanation;Demonstration;Tactile cues   Comprehension Verbalized understanding;Returned demonstration                [facility] Goals - 04/26/16 1149      CC Long Term Goal  #1   Title Independent with HEP for shoulder and trunk stretches and ready to return to playing tennis   Status Achieved     CC Long Term Goal  #2   Title Both shoulder active abduction to 180 degrees for improved ADLs   Baseline Rt 168 and Lt 170 degrees-04/26/16 (this seems to be pts full ROM as she has no tightness at end  ROM)   Status Achieved     CC Long Term Goal  #3   Title both shoulder active flexion to 165 degrees for improved overhead reach   Baseline Rt 171 and Lt 160 degrees-04/26/16 (this seems to be pts full ROM as she has no tightness end ROM)   Status Achieved     CC Long Term Goal  #4   Title Pt. will report at least 75% decrease in discomfort when she goes to raise her right arm to reach into a cupboard or the like  Baseline Pt reports no tightness or discomfort with reaching now-04/26/16   Status Achieved     CC Long Term Goal  #5   Title Pt. will be able to mimic a tennis serve without pain   Baseline Pt reports not feeling ready to do this as of yet, but due to an old shoulder injury, not due to recovery from surgery. Though she reports exercises we have given her have been also helping to strengthen her weak Rt shoulder. -04/26/16   Status Deferred            Plan - 04/26/16 1223    Clinical Impression Statement Pt has made very good progress with this episode and she feels well educated on how to safely progress her exercises now and in future with return to playing tennis. She has met all goals and is prepared to D/C at this time.    Rehab Potential Excellent   PT Frequency 2x / week   PT Duration 4 weeks   PT Treatment/Interventions ADLs/Self Care Home Management;Therapeutic activities;Therapeutic exercise;Patient/family education;Manual techniques;Manual lymph drainage;Scar mobilization;Passive range of motion   PT Next Visit Plan D/C this visit.   Consulted and Agree with Plan of Care Patient      Patient will benefit from skilled therapeutic intervention in order to improve the following deficits and impairments:  Decreased range of motion, Impaired UE functional use, Postural dysfunction, Decreased scar mobility, Increased fascial restricitons  Visit Diagnosis: Stiffness of right shoulder, not elsewhere classified  Stiffness of left shoulder, not elsewhere  classified  Acute pain of right shoulder  Acute pain of left shoulder     Problem List Patient Active Problem List   Diagnosis Date Noted  . Migraine without aura and without status migrainosus, not intractable 02/28/2016  . S/P splenectomy 10/28/2013  . Splenic artery aneurysm ([place]) 09/04/2013  . BRCA2 positive 09/08/2011    Otelia Limes, PTA 04/26/2016, 12:48 PM  [place], [place], [address] Phone: [phone]   Fax:  [phone]  Name: Elmarie Devlin MRN: [id number] Date of Birth: [birth year]  PHYSICAL THERAPY DISCHARGE SUMMARY  Visits from Start of Care: 4  Current functional level related to goals / functional outcomes: Goals met as noted above; one deferred, but not related to progress in therapy.   Remaining deficits: Still not serving a tennis ball.   Education / Equipment: Home exercise program for stretching and strengthening. Plan: Patient agrees to discharge.  Patient goals were met. Patient is being discharged due to meeting the stated rehab goals.  ?????    Serafina Royals, PT 04/26/16 4:05 PM

## 2016-04-28 ENCOUNTER — Ambulatory Visit: Payer: 59 | Admitting: Physical Therapy

## 2016-05-05 ENCOUNTER — Encounter: Payer: 59 | Admitting: Physical Therapy

## 2016-05-11 IMAGING — US IR ANGIO/VISCERAL SELECTIVE EA VESSEL WO/W FLUSH
1 series · 2 of 2 positions shown · non-contrast
Comparison: none

CLINICAL DATA: 12 mm distal splenic artery aneurysm with left upper
quadrant pain. The patient has been previously evaluated and
presents for arteriography and attempted embolization of the
aneurysm.

[Series 1: ir angio/visceral selective ea vessel wo/w flush · 2 of 2 slices shown]
[im 1/2]
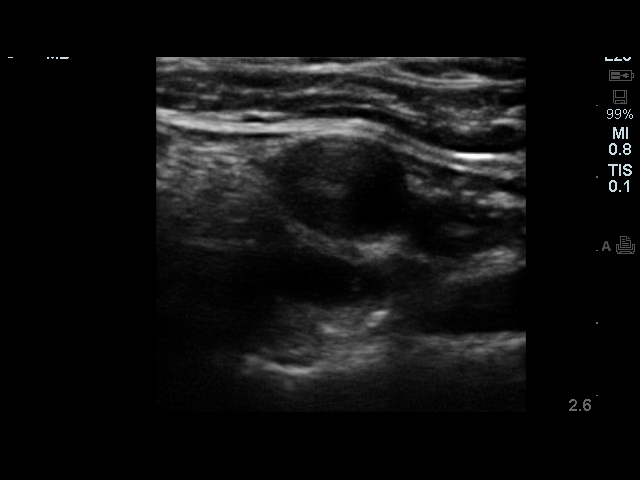
[im 2/2]
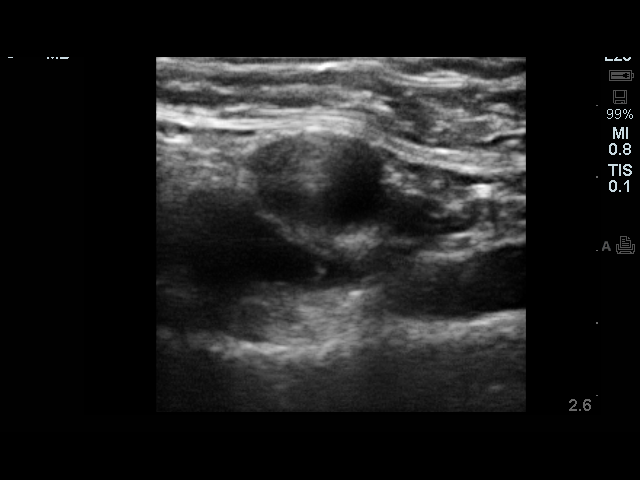

[2 of 2 positions shown; findings below may reference images not displayed]

EXAM:
1. ULTRASOUND GUIDANCE FOR VASCULAR ACCESS OF THE RIGHT COMMON
FEMORAL ARTERY
2. SELECTIVE VISCERAL ARTERIOGRAPHY OF THE CELIAC AXIS
3. ADDITIONAL SELECTIVE ARTERIOGRAPHY OF THE SPLENIC ARTERY

MEDICATIONS:
2 g IV Ancef. As antibiotic prophylaxis, Ancef was ordered
pre-procedure and administered intravenously within one hour of
incision.

Anesthesia:  General

CONTRAST:  50mL OMNIPAQUE IOHEXOL 300 MG/ML SOLN

FLUOROSCOPY TIME:  50 minutes.

PROCEDURE:
Prior to the procedure, detailed informed consent was obtained from
the patient for arteriography with possible embolization. Risks and
benefits of the procedure were discussed in detail at the time of
previous consultation.

The right groin was sterilely prepped with Betadine and draped.
Maximal barrier sterile technique was utilized including caps, mask,
sterile gowns, sterile gloves, sterile drape, hand hygiene and skin
antiseptic.

Ultrasound was used to confirm patency of the right common femoral
artery. Under ultrasound guidance, access of the artery was
performed with a micropuncture set. Ultrasound image documentation
was performed. After guidewire access, a 5 French sheath was placed
over a guidewire. Attempts were made to advance a 5 French cobra
catheter into the celiac axis. Selective arteriography of the celiac
axis was performed via a 5 French Sos catheter.

A Renegade high flow micro catheter was advanced through the 5
French catheter and into the splenic artery. Arteriography was
performed through the micro catheter at 2 different locations within
the main splenic artery.

Micro catheter and 5 French catheter were then removed and a 6
French angled sheath advanced into the abdominal aorta. The celiac
axis was recatheterized and both high flow and smaller caliber micro
catheters were advanced over various micro guidewires in the splenic
artery.

Oblique imaging of the common femoral artery access was performed
after injection of contrast through a sheath. Arteriotomy closure
was performed with the Cordis ExoSeal device.
FINDINGS: Celiac arteriography demonstrates an extremely tortuous splenic
artery. The focal 12 mm aneurysm emanating from a branch point of a
superior splenic artery branch was well visualized and completely
opacifies with contrast during arteriography. The second smaller
aneurysm emanating from the distal aspect of the main splenic artery
is visible. Small short gastric branches are visualized. The
visualized common hepatic and left gastric arteries are
unremarkable.

A micro catheter was able to be advanced nearly to the splenic hilum
but could not be advanced all the way to the level of the superior
splenic aneurysm due to the significant tortuosity present. Despite
multiple maneuvers utilizing different catheters, guidewires and a
longer sheath, catheter access could not be established to allow
coil embolization of the aneurysm.
IMPRESSION: Focal branch vessel splenic artery aneurysm in the superior splenic
hilum. The more subtle smaller distal splenic artery aneurysm is
more difficult to visualize by arteriography, but was identifiable.
Due to extreme tortuosity of the splenic artery, the distal dominant
splenic artery aneurysm could not be catheterized to allow coil
embolization.

## 2016-05-11 IMAGING — XA IR ANGIO/VISCERAL SELECTIVE EA VESSEL WO/W FLUSH
1 series · 13 of 24 positions shown · IV contrast (IODINE)
Comparison: none

CLINICAL DATA: 12 mm distal splenic artery aneurysm with left upper
quadrant pain. The patient has been previously evaluated and
presents for arteriography and attempted embolization of the
aneurysm.

[Series 300: ir embo arterial not (person_name) inc g · arterial · 13 of 27 slices shown]
[im 1/27]
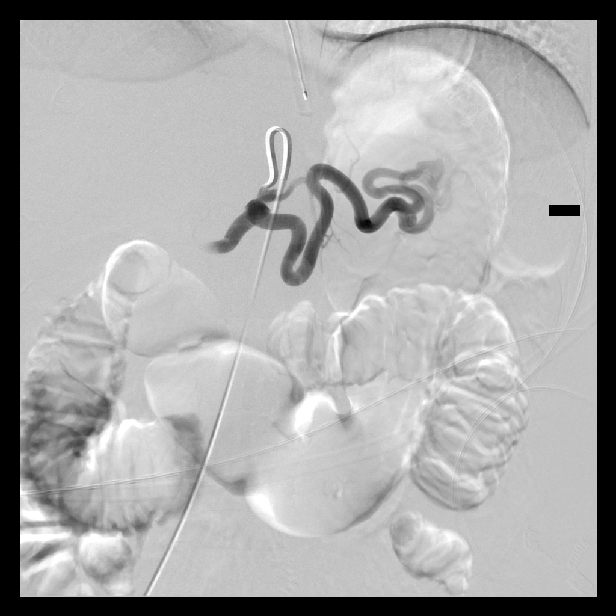
[im 3/27]
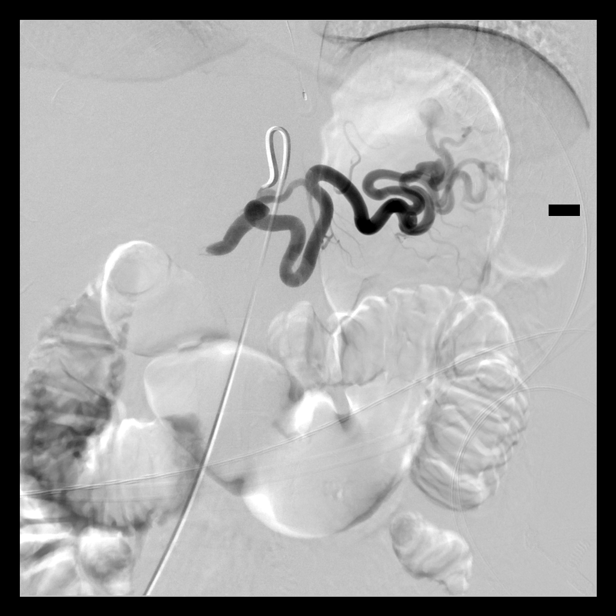
[im 5/27]
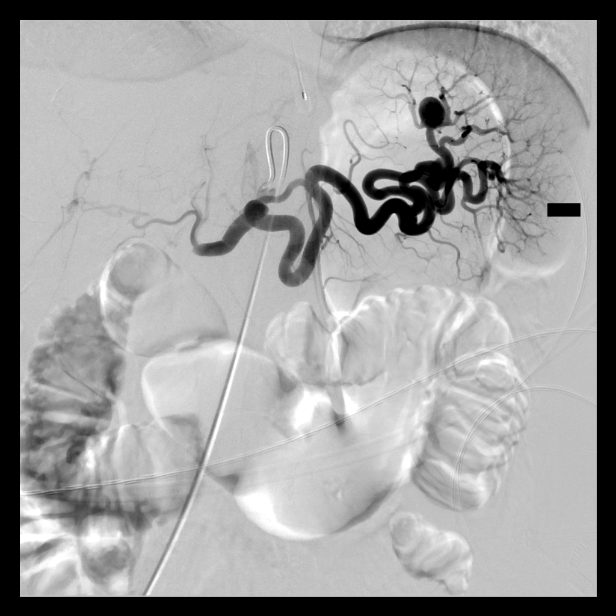
[im 7/27]
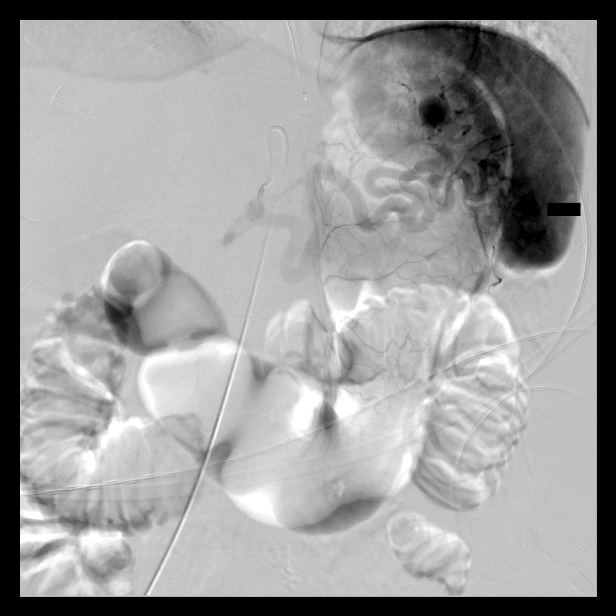
[im 10/27]
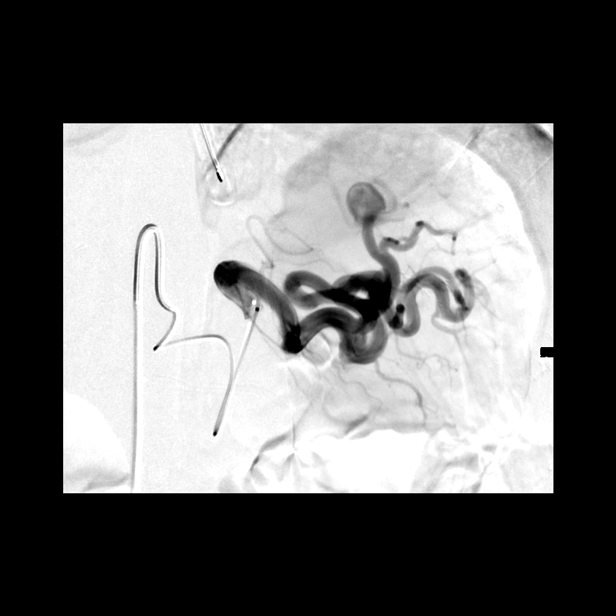
[im 12/27]
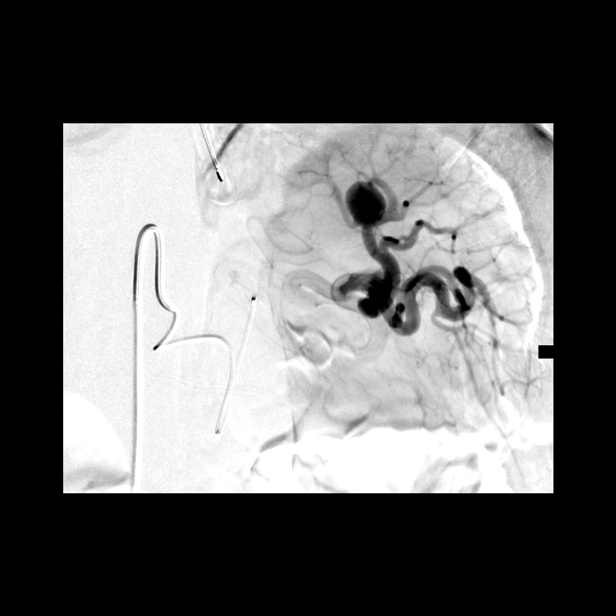
[im 14/27]
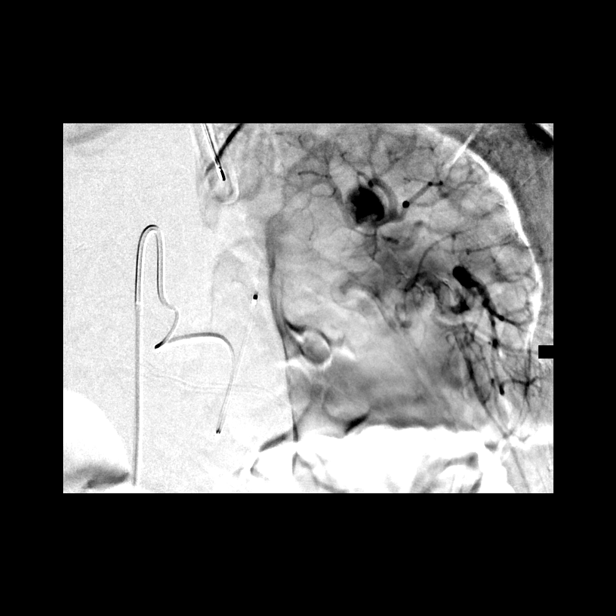
[im 15/27]
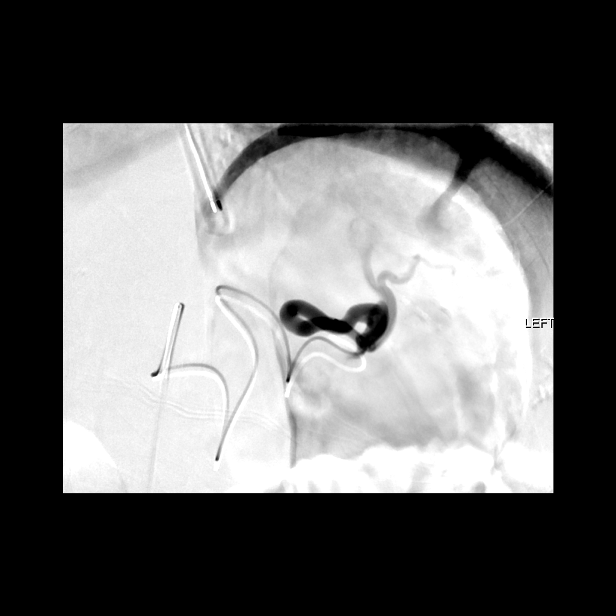
[im 17/27]
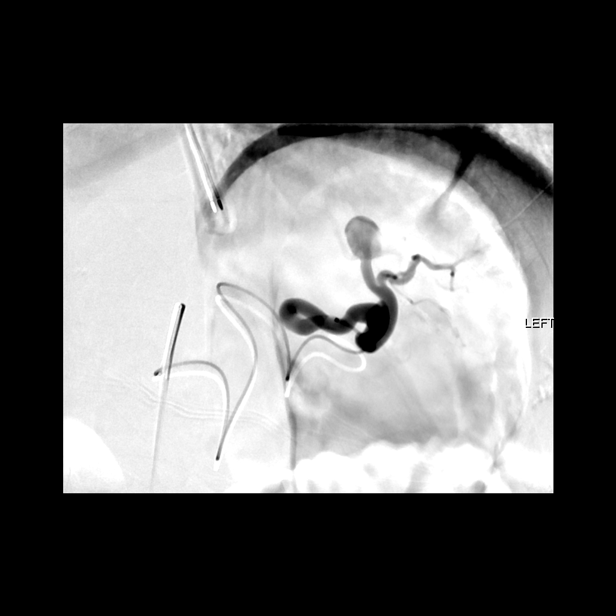
[im 20/27]
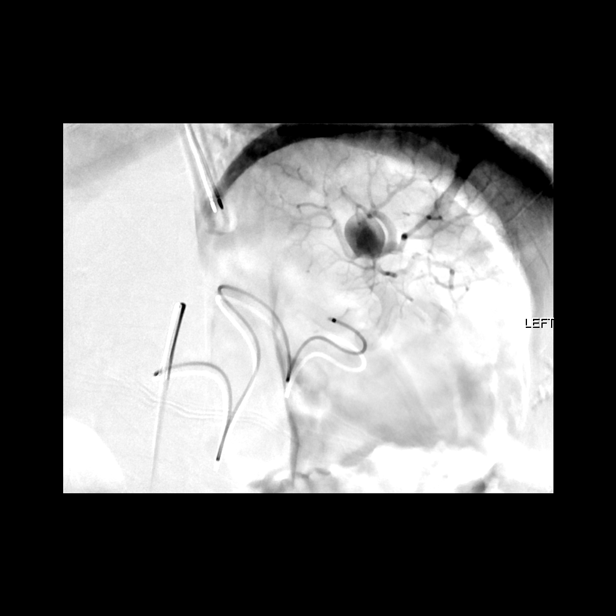
[im 22/27]
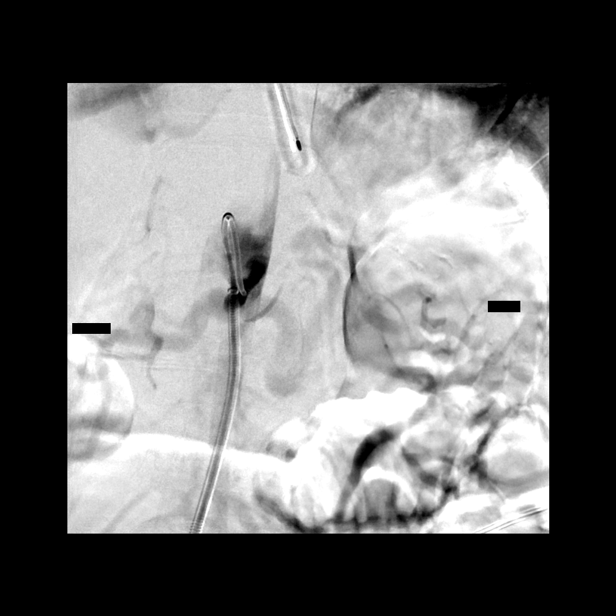
[im 24/27]
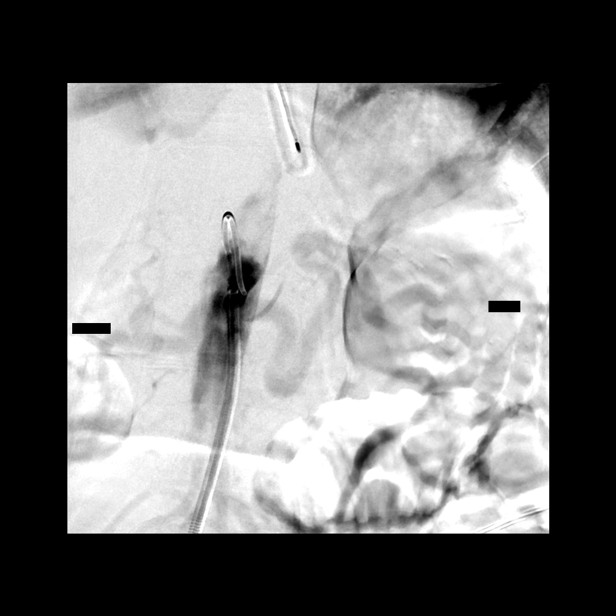
[im 27/27]
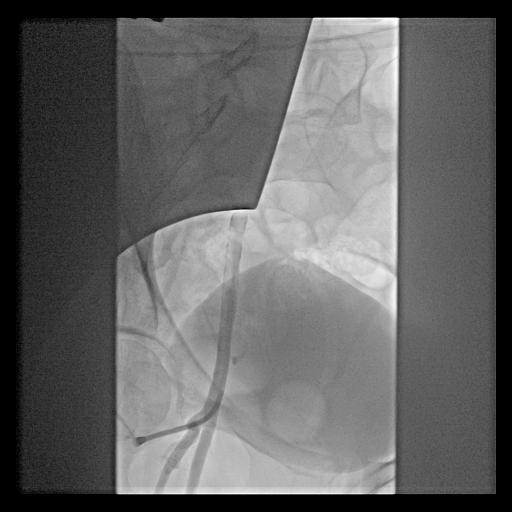

[13 of 24 positions shown; findings below may reference images not displayed]

EXAM:
1. ULTRASOUND GUIDANCE FOR VASCULAR ACCESS OF THE RIGHT COMMON
FEMORAL ARTERY
2. SELECTIVE VISCERAL ARTERIOGRAPHY OF THE CELIAC AXIS
3. ADDITIONAL SELECTIVE ARTERIOGRAPHY OF THE SPLENIC ARTERY

MEDICATIONS:
2 g IV Ancef. As antibiotic prophylaxis, Ancef was ordered
pre-procedure and administered intravenously within one hour of
incision.

Anesthesia:  General

CONTRAST:  50mL OMNIPAQUE IOHEXOL 300 MG/ML SOLN

FLUOROSCOPY TIME:  50 minutes.

PROCEDURE:
Prior to the procedure, detailed informed consent was obtained from
the patient for arteriography with possible embolization. Risks and
benefits of the procedure were discussed in detail at the time of
previous consultation.

The right groin was sterilely prepped with Betadine and draped.
Maximal barrier sterile technique was utilized including caps, mask,
sterile gowns, sterile gloves, sterile drape, hand hygiene and skin
antiseptic.

Ultrasound was used to confirm patency of the right common femoral
artery. Under ultrasound guidance, access of the artery was
performed with a micropuncture set. Ultrasound image documentation
was performed. After guidewire access, a 5 French sheath was placed
over a guidewire. Attempts were made to advance a 5 French cobra
catheter into the celiac axis. Selective arteriography of the celiac
axis was performed via a 5 French Sos catheter.

A Renegade high flow micro catheter was advanced through the 5
French catheter and into the splenic artery. Arteriography was
performed through the micro catheter at 2 different locations within
the main splenic artery.

Micro catheter and 5 French catheter were then removed and a 6
French angled sheath advanced into the abdominal aorta. The celiac
axis was recatheterized and both high flow and smaller caliber micro
catheters were advanced over various micro guidewires in the splenic
artery.

Oblique imaging of the common femoral artery access was performed
after injection of contrast through a sheath. Arteriotomy closure
was performed with the Cordis ExoSeal device.
FINDINGS: Celiac arteriography demonstrates an extremely tortuous splenic
artery. The focal 12 mm aneurysm emanating from a branch point of a
superior splenic artery branch was well visualized and completely
opacifies with contrast during arteriography. The second smaller
aneurysm emanating from the distal aspect of the main splenic artery
is visible. Small short gastric branches are visualized. The
visualized common hepatic and left gastric arteries are
unremarkable.

A micro catheter was able to be advanced nearly to the splenic hilum
but could not be advanced all the way to the level of the superior
splenic aneurysm due to the significant tortuosity present. Despite
multiple maneuvers utilizing different catheters, guidewires and a
longer sheath, catheter access could not be established to allow
coil embolization of the aneurysm.
IMPRESSION: Focal branch vessel splenic artery aneurysm in the superior splenic
hilum. The more subtle smaller distal splenic artery aneurysm is
more difficult to visualize by arteriography, but was identifiable.
Due to extreme tortuosity of the splenic artery, the distal dominant
splenic artery aneurysm could not be catheterized to allow coil
embolization.

## 2016-05-12 ENCOUNTER — Encounter: Payer: 59 | Admitting: Physical Therapy

## 2016-06-19 NOTE — Addendum Note (Signed)
Addendum  created 06/19/16 1123 by Oleta Mouse, MD   Sign clinical note

## 2016-06-27 ENCOUNTER — Ambulatory Visit (INDEPENDENT_AMBULATORY_CARE_PROVIDER_SITE_OTHER): Payer: 59 | Admitting: Neurology

## 2016-06-27 ENCOUNTER — Encounter: Payer: Self-pay | Admitting: Neurology

## 2016-06-27 VITALS — BP 116/60 | HR 57 | Ht 64.0 in | Wt 117.0 lb

## 2016-06-27 DIAGNOSIS — G43009 Migraine without aura, not intractable, without status migrainosus: Secondary | ICD-10-CM

## 2016-06-27 MED ORDER — NARATRIPTAN HCL 2.5 MG PO TABS: ORAL_TABLET | ORAL | 11 refills | Status: DC

## 2016-06-27 NOTE — Progress Notes (Signed)
NEUROLOGY FOLLOW UP OFFICE NOTE  Dawn Boyd [id number] 1970-02-09  HISTORY OF PRESENT ILLNESS: I had the pleasure of seeing Dawn Boyd in follow-up in the neurology clinic on 06/27/2016.  The patient was last seen 4 months ago for migraines. She was having an increase in frequency of migraines occurring 2-3 times a week in a 82-monthperiod. MRI brain normal. She was hesitant to start preventative medication and agreed to closely monitor headaches. She reports that she had some TMJ issues and a lot of jaw pain. After her dentist did some filing, she noticed a significant reduction in her headaches. She takes Naratriptan as needed, usually once a week on average now. She usually takes a preventative Advil before playing tennis, and finds this helps prevent a migraine. She has some sinus issues/congestion, and feels this is giving her migraines in the morning. Dehydration is also a trigger. She has some ear pain and pain around her jaws, occasional neck strain and "clicking" in the back of her neck. No focal numbness/tingling/weakness, no falls.   HPI 02/28/2016: This is a very pleasant 46yo RH woman with a history of migraines since 2003, who presented for a second opinion on new approaches to her migraines. Migraines started after she gave birth to her son in 2003. They did get better after she had a hysterectomy 5 years ago, occurring every 2 weeks or so, however over the past 6 months, she has been having them more frequently, around 2-3 times a week. Migraines are over the frontal and vertex regions, sometimes behind her ears, she has throbbing pain with associated nausea if the headaches become really bad. She has really bad 10/10 ones where medications do not help much around once a month, lasting 2-3 days. She would take prn medication but headaches return. The migraines 2-3 times a week are around 6/10, usually relieved by 1/2 tablet of naratriptan combined with Excedrin migraine No visual  obscurations, she has some photo and phonophobia. She has found playing tennis to be a trigger, she plays it twice a week and consistently gets a migraine, so she would preventatively take naratriptan and Excedrin before playing. Other triggers include perfume, chocolates, wine, cheese, poor sleep, skipping meals. She has found that drinking coffee helps, she usually drinks 1 cup of coffee daily. She reports hitting her head twice this year, with the last one she had worsened headaches which have resolved. She has had right-sided tinnitus for the past year, ENT evaluation unremarkable. She had an MRI brain without contrast last 08/2015 which I personally reviewed, no acute changes seen, within normal limits. She previously took Maxalt which helped the migraine immediately, but did not last long enough, she would have migraine recurrence. She was switched to naratriptan, which does last longer, but takes a longer time to take effect. In the past she took an unrecalled antidepressant for migraine prevention, but had side effects of palpitations, which made her hesitant about preventative medications.  She denies any dizziness, diplopia, dysarthria/dysphagia, focal numbness/tingling/weakness, bowel/bladder dysfunction. She has some neck pain, at times with shooting pain in the back of her head, and feels that her migraines may be coming from her neck. She usually gets 7-8 hours of good sleep. There is a family history of migraines in her mother and sister. Her maternal grandmother had several strokes. She has a strong family history of cancer and was positive for BRCA 2 gene mutation, and underwent bilateral mastectomy.  PAST MEDICAL HISTORY: Past Medical History:  Diagnosis Date  . Allergy   . Anxiety    situational  . Arthritis    "clicking in the neck" on occasion   . Blood dyscrasia    thrombocytopenia-age 55, resolved, told that she is completely cured    . Blood transfusion    pt states age 28 in  [place] had problems with blood clotting, no further problems  . Blood transfusion without reported diagnosis   . BRCA2 positive 09/08/2011   pt. has hx./ mother has hx. of ovarian cancer-no cancers for patient  . Bronchitis   . Clotting disorder ([place])    THROMOCYTOPENIA  AGE 73  . Depression    AFTER CHILD BIRTH  . Gallbladder polyp   . GERD (gastroesophageal reflux disease)    tums prn  . Gilbert's syndrome    "Liver doesn't process bilirubin"  . Headache(784.0)    migraines-usually once weekly, really  bad q month or two  . Heart murmur    no problems, states she has been told that it is not audible at all times   . IBS (irritable bowel syndrome)   . Osteopenia    MILD  . PONV (postoperative nausea and vomiting)   . Seasonal allergies   . 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 Voiding difficulty    history of post procedure difficulty voiding    MEDICATIONS: Current Outpatient Prescriptions on File Prior to Visit  Medication Sig Dispense Refill  . aspirin-acetaminophen-caffeine (EXCEDRIN MIGRAINE) 250-250-65 MG tablet Take 1 tablet by mouth daily as needed for migraine.    Marland Kitchen ibuprofen (ADVIL,MOTRIN) 200 MG tablet Take 200 mg by mouth as needed for headache.     . naratriptan (AMERGE) 2.5 MG tablet Take 1.25 mg by mouth daily.      No current facility-administered medications on file prior to visit.     ALLERGIES: Allergies  Allergen Reactions  . Lactose Intolerance (Gi) Diarrhea, Nausea Only and Other (See Comments)    Bloating   . Tape Dermatitis    Skin redness from the glue.    FAMILY HISTORY: Family History  Problem Relation Age of Onset  . Hypertension Mother   . Cancer Mother   . Colon cancer Neg Hx   . Esophageal cancer Neg Hx   . Pancreatic cancer Neg Hx   . Rectal cancer Neg Hx     SOCIAL HISTORY: Social History   Social History  . Marital status: Married    Spouse name: N/A  . Number of children: 2  . Years of education: N/A    Occupational History  . Artist    Social History Main Topics  . Smoking status: Former Smoker    Types: Cigars    Quit date: 10/23/1993  . Smokeless tobacco: Never Used     Comment: During college smoked cigars  . Alcohol use 4.2 oz/week    7 Cans of beer per week     Comment: social beer/ mixed drink, using less now  . Drug use: No  . Sexual activity: Yes   Other Topics Concern  . Not on file   Social History Narrative  . No narrative on file    REVIEW OF SYSTEMS: Constitutional: No fevers, chills, or sweats, no generalized fatigue, change in appetite Eyes: No visual changes, double vision, eye pain Ear, nose and throat: No hearing loss, ear pain, nasal congestion, sore throat Cardiovascular: No chest pain, palpitations Respiratory:  No shortness of breath at rest or with exertion, wheezes  GastrointestinaI: No nausea, vomiting, diarrhea, abdominal pain, fecal incontinence Genitourinary:  No dysuria, urinary retention or frequency Musculoskeletal:  + occasional neck pain, no back pain Integumentary: No rash, pruritus, skin lesions Neurological: as above Psychiatric: No depression, insomnia, anxiety Endocrine: No palpitations, fatigue, diaphoresis, mood swings, change in appetite, change in weight, increased thirst Hematologic/Lymphatic:  No anemia, purpura, petechiae. Allergic/Immunologic: no itchy/runny eyes, nasal congestion, recent allergic reactions, rashes  PHYSICAL EXAM: Vitals:   06/27/16 1005  BP: 116/60  Pulse: (!) 57   General: No acute distress Head:  Normocephalic/atraumatic Neck: supple, no paraspinal tenderness, full range of motion Heart:  Regular rate and rhythm Lungs:  Clear to auscultation bilaterally Back: No paraspinal tenderness Skin/Extremities: No rash, no edema Neurological Exam: alert and oriented to person, place, and time. No aphasia or dysarthria. Fund of knowledge is appropriate.  Recent and remote memory are intact.  Attention and  concentration are normal.    Able to name objects and repeat phrases. Cranial nerves: Pupils equal, round, reactive to light. Extraocular movements intact with no nystagmus. Visual fields full. Facial sensation intact. No facial asymmetry. Tongue, uvula, palate midline.  Motor: Bulk and tone normal, muscle strength 5/5 throughout with no pronator drift.  Sensation to light touch intact.  No extinction to double simultaneous stimulation.  Deep tendon reflexes 2+ throughout, toes downgoing.  Finger to nose testing intact.  Gait narrow-based and steady, able to tandem walk adequately.  Romberg negative.  IMPRESSION: This is a pleasant 1 o RH woman with a history of migraines without aura. She initially presented due to an increase in migraine frequency. Her neurological exam and MRI brain normal. She reports that since having dental work done for TMJ-type pain, there has been a significant reduction in migraines. She is currently happy with current migraine control, taking prn naratriptan with Advil on average once a week. She is aware of minimizing rescue medications to 2-3 times a week to avoid rebound headaches. She will keep a headache calendar and follow-up in 1 year, she knows to call for any changes.   Thank you for allowing me to participate in her care.  Please do not hesitate to call for any questions or concerns.  The duration of this appointment visit was 15 minutes of face-to-face time with the patient.  Greater than 50% of this time was spent in counseling, explanation of diagnosis, planning of further management, and coordination of care.   Ellouise Newer, M.D.   CC: Dr. Virgina Jock

## 2016-06-27 NOTE — Patient Instructions (Signed)
1. Continue naratriptan and excedrin as needed. Do not take more than 2-3 times a week, otherwise this can worsen headaches 2. Continue headache calendar 3. Follow-up in 1 year, call for any changes

## 2016-06-28 ENCOUNTER — Ambulatory Visit (INDEPENDENT_AMBULATORY_CARE_PROVIDER_SITE_OTHER): Payer: 59 | Admitting: Internal Medicine

## 2016-06-28 ENCOUNTER — Encounter: Payer: Self-pay | Admitting: Internal Medicine

## 2016-06-28 VITALS — Ht 64.0 in | Wt 117.0 lb

## 2016-06-28 DIAGNOSIS — K589 Irritable bowel syndrome without diarrhea: Secondary | ICD-10-CM

## 2016-06-28 DIAGNOSIS — Z1509 Genetic susceptibility to other malignant neoplasm: Secondary | ICD-10-CM | POA: Diagnosis not present

## 2016-06-28 MED ORDER — NA SULFATE-K SULFATE-MG SULF 17.5-3.13-1.6 GM/177ML PO SOLN: ORAL | 0 refills | Status: DC

## 2016-06-28 NOTE — Progress Notes (Signed)
Subjective:    Patient ID: Dawn Boyd, female    DOB: [birth year], 46 y.o.   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  She was last seen in January 2018. Since this time she underwent bilateral double mastectomy given her risk of breast cancer with her gene mutation. This went well.  She has been feeling well. She still has some intermittent abdominal bloating in the mid lower abdomen. At times she feels a "clicking" feeling in her right lower quadrant. Bowel movements have been regular occurring daily. Occasionally bowel movements contain mucus. No blood in her stool or melena. Appetite has been good. She denies dysphagia and odynophagia. Occasionally feels an upper abdominal fullness. Denies nausea and vomiting and dysphagia.  She had a repeat ultrasound in January, reviewed today, to follow-up gallbladder polyps. Gallbladder polyps were stable  Review of Systems As per history of present illness, otherwise negative  Current Medications, Allergies, Past Medical History, Past Surgical History, Family History and Social History were reviewed in [employer] record.     Objective:   Physical Exam Ht _0  (1.626 m)   Wt 117 lb (53.1 kg)   LMP 11/01/2010   BMI 20.08 kg/m  Constitutional: Well-developed and well-nourished. No distress. HEENT: Normocephalic and atraumatic.  Conjunctivae are normal.  No scleral icterus. Neck: Neck supple. Trachea midline. Cardiovascular: Normal rate, regular rhythm and intact distal pulses. No M/R/G Pulmonary/chest: Effort normal and breath sounds normal. No wheezing, rales or rhonchi. Abdominal: Soft, nontender, nondistended. Bowel sounds active throughout. There are no masses palpable. No hepatosplenomegaly. Extremities: no clubbing, cyanosis, or edema Neurological: Alert and oriented to  person place and time. Skin: Skin is warm and dry. Psychiatric: Normal mood and affect. Behavior is normal.  ABDOMEN ULTRASOUND COMPLETE   COMPARISON:  Abdominal ultrasound of July 29, 2014.   FINDINGS: Gallbladder: The gallbladder is adequately distended. There are echogenic non mobile nonshadowing foci in the fundus measuring up to 3 mm in diameter compatible with polyps. No stones or sludge are observed. There is no gallbladder wall thickening, pericholecystic fluid, or positive sonographic Murphy's sign.   Common bile duct: Diameter: 5.2 mm   Liver: No focal lesion identified. Within normal limits in parenchymal echogenicity.   IVC: No abnormality visualized.   Pancreas: Visualized portion unremarkable.   Spleen: The spleen is surgically absent.   Right Kidney: Length: 10.2 cm. Echogenicity within normal limits. No mass or hydronephrosis visualized.   Left Kidney: Length: 9.4 cm. Echogenicity within normal limits. No mass or hydronephrosis visualized.   Abdominal aorta: No aneurysm visualized.   Other findings: There is no ascites.   IMPRESSION: Gallbladder polyps. No stones or sonographic evidence of acute cholecystitis. If there are clinical concerns of chronic cholecystitis, a nuclear medicine hepatobiliary scan with gallbladder ejection fraction determination may be useful.   Previous splenectomy.   No acute intra-abdominal abnormality.     Electronically Signed   By: David  Martinique M.D.   On: 01/27/2016 10:37   CBC    Component Value Date/Time   WBC 7.2 02/29/2016 1430   RBC 4.23 02/29/2016 1430   HGB 12.6 02/29/2016 1430   HGB 12.2 02/23/2015 0957   HCT 37.5 02/29/2016 1430   HCT 36.3 02/23/2015 0957   PLT 318 02/29/2016 1430   PLT 293 02/23/2015 0957   MCV 88.7 02/29/2016 1430  MCV 89.6 02/23/2015 0957   MCH 29.8 02/29/2016 1430   MCHC 33.6 02/29/2016 1430   RDW 12.7 02/29/2016 1430   RDW 13.0 02/23/2015 0957   LYMPHSABS 2.7 02/23/2015  0957   MONOABS 0.6 02/23/2015 0957   EOSABS 0.1 02/23/2015 0957   BASOSABS 0.0 02/23/2015 0957      Assessment & Plan:   46 year old female with a history of IBS, GERD, BCRA2 gene mutation now s/p mastectomy earlier this year, MSH2 gene mutation here for follow-up.  1. [place] 2 gene mutation -- I recommended repeat screening colonoscopy given that leg syndrome can be associated with [facility] 2 gene mutations. We discussed the risk benefits and alternatives and she is agreeable and wishes to proceed. There is also a small risk of gastric cancer particularly when H. pylori is involved with this gene mutation. For this reason we are proceeding upper endoscopy as well.  2. Abdominal bloating intermittent abdominal discomfort -- likely irritable bowel related. This symptom is stable without alarm symptoms.  25 minutes spent with the patient today. Greater than 50% was spent in counseling and coordination of care with the patient

## 2016-06-28 NOTE — Patient Instructions (Signed)
You have been scheduled for an endoscopy and colonoscopy. Please follow the written instructions given to you at your visit today. Please pick up your prep supplies at the pharmacy within the next 1-3 days. If you use inhalers (even only as needed), please bring them with you on the day of your procedure. Your physician has requested that you go to www.startemmi.com and enter the access code given to you at your visit today. This web site gives a general overview about your procedure. However, you should still follow specific instructions given to you by our office regarding your preparation for the procedure.  If you are age 31 or older, your body mass index should be between 23-30. Your Body mass index is 20.08 kg/m. If this is out of the aforementioned range listed, please consider follow up with your Primary Care Provider.  If you are age 21 or younger, your body mass index should be between 19-25. Your Body mass index is 20.08 kg/m. If this is out of the aformentioned range listed, please consider follow up with your Primary Care Provider.

## 2016-08-21 ENCOUNTER — Encounter: Payer: Self-pay | Admitting: Internal Medicine

## 2016-08-29 ENCOUNTER — Ambulatory Visit (AMBULATORY_SURGERY_CENTER): Payer: 59 | Admitting: Internal Medicine

## 2016-08-29 ENCOUNTER — Encounter: Payer: Self-pay | Admitting: Internal Medicine

## 2016-08-29 VITALS — BP 119/73 | HR 63 | Temp 98.0°F | Resp 16 | Ht 64.0 in | Wt 117.0 lb

## 2016-08-29 DIAGNOSIS — R10819 Abdominal tenderness, unspecified site: Secondary | ICD-10-CM | POA: Diagnosis not present

## 2016-08-29 DIAGNOSIS — Z1509 Genetic susceptibility to other malignant neoplasm: Secondary | ICD-10-CM

## 2016-08-29 DIAGNOSIS — R140 Abdominal distension (gaseous): Secondary | ICD-10-CM

## 2016-08-29 DIAGNOSIS — K2950 Unspecified chronic gastritis without bleeding: Secondary | ICD-10-CM | POA: Diagnosis not present

## 2016-08-29 MED ORDER — SODIUM CHLORIDE 0.9 % IV SOLN: 500.0000 mL | INTRAVENOUS | Status: AC

## 2016-08-29 NOTE — Progress Notes (Signed)
Called to room to assist during endoscopic procedure.  Patient ID and intended procedure confirmed with present staff. Received instructions for my participation in the procedure from the performing physician.  

## 2016-08-29 NOTE — Op Note (Signed)
[facility] Patient Name: Keon Benscoter Procedure Date: 08/29/2016 1:38 PM MRN: [id number] Endoscopist: Jerene Bears , MD Age: 46 Referring MD:  Date of Birth: May 09, 1970 Gender: Female Account #: [id number] Procedure:                Upper GI endoscopy Indications:              personal history of MSH2 gene mutation raising the                            possiblility of Lynch Syndrome Medicines:                Monitored Anesthesia Care Procedure:                Pre-Anesthesia Assessment:                           - Prior to the procedure, a History and Physical                            was performed, and patient medications and                            allergies were reviewed. The patient's tolerance of                            previous anesthesia was also reviewed. The risks                            and benefits of the procedure and the sedation                            options and risks were discussed with the patient.                            All questions were answered, and informed consent                            was obtained. Prior Anticoagulants: The patient has                            taken no previous anticoagulant or antiplatelet                            agents. ASA Grade Assessment: II - A patient with                            mild systemic disease. After reviewing the risks                            and benefits, the patient was deemed in                            satisfactory condition to undergo the procedure.  After obtaining informed consent, the endoscope was                            passed under direct vision. Throughout the                            procedure, the patient's blood pressure, pulse, and                            oxygen saturations were monitored continuously. The                            Endoscope was introduced through the mouth, and                            advanced to the second part  of duodenum. The upper                            GI endoscopy was accomplished without difficulty.                            The patient tolerated the procedure well. Scope In: Scope Out: Findings:                 The examined esophagus was normal.                           The entire examined stomach was normal. Biopsies                            were taken with a cold forceps for histology and                            Helicobacter pylori testing.                           The cardia and gastric fundus were normal on                            retroflexion.                           The examined duodenum was normal. Complications:            No immediate complications. Estimated Blood Loss:     Estimated blood loss was minimal. Impression:               - Normal esophagus.                           - Normal stomach. Biopsied.                           - Normal examined duodenum. Recommendation:           - Patient has a contact number available for  emergencies. The signs and symptoms of potential                            delayed complications were discussed with the                            patient. Return to normal activities tomorrow.                            Written discharge instructions were provided to the                            patient.                           - Resume previous diet.                           - Continue present medications.                           - Await pathology results.                           - Repeat EGD interval to be based on pathology                            results. Jerene Bears, MD 08/29/2016 2:14:06 PM This report has been signed electronically.

## 2016-08-29 NOTE — Op Note (Signed)
[place] Patient Name: Dawn Boyd Procedure Date: 08/29/2016 1:38 PM MRN: [id number] Endoscopist: Jerene Bears , MD Age: 46 Referring MD:  Date of Birth: May 01, 1970 Gender: Female Account #: [id number] Procedure:                Colonoscopy Indications:              High risk colon cancer surveillance: Personal                            history of MSH2 gene mutation raising the question                            of Lynch Syndrome, Last colonoscopy 3 years ago Medicines:                Monitored Anesthesia Care Procedure:                Pre-Anesthesia Assessment:                           - Prior to the procedure, a History and Physical                            was performed, and patient medications and                            allergies were reviewed. The patient's tolerance of                            previous anesthesia was also reviewed. The risks                            and benefits of the procedure and the sedation                            options and risks were discussed with the patient.                            All questions were answered, and informed consent                            was obtained. Prior Anticoagulants: The patient has                            taken no previous anticoagulant or antiplatelet                            agents. ASA Grade Assessment: II - A patient with                            mild systemic disease. After reviewing the risks                            and benefits, the patient was deemed in  satisfactory condition to undergo the procedure.                           After obtaining informed consent, the colonoscope                            was passed under direct vision. Throughout the                            procedure, the patient's blood pressure, pulse, and                            oxygen saturations were monitored continuously. The                            Model PCF-[id number]  [id number]) scope was introduced                            through the anus and advanced to the the terminal                            ileum. The colonoscopy was performed without                            difficulty. The patient tolerated the procedure                            well. The quality of the bowel preparation was                            good. The terminal ileum, ileocecal valve,                            appendiceal orifice, and rectum were photographed. Scope In: 1:47:55 PM Scope Out: 2:03:16 PM Scope Withdrawal Time: 0 hours 11 minutes 51 seconds  Total Procedure Duration: 0 hours 15 minutes 21 seconds  Findings:                 The digital rectal exam was normal.                           The terminal ileum appeared normal.                           The colon (entire examined portion) appeared normal.                           Internal hemorrhoids were found during                            retroflexion. The hemorrhoids were small. Complications:            No immediate complications. Estimated Blood Loss:     Estimated blood loss: none. Impression:               - The examined portion of  the ileum was normal.                           - The entire examined colon is normal.                           - Small internal hemorrhoids.                           - No specimens collected. Recommendation:           - Patient has a contact number available for                            emergencies. The signs and symptoms of potential                            delayed complications were discussed with the                            patient. Return to normal activities tomorrow.                            Written discharge instructions were provided to the                            patient.                           - Resume previous diet.                           - Continue present medications.                           - Repeat colonoscopy in 2 years for screening                             purposes. Jerene Bears, MD 08/29/2016 2:18:09 PM This report has been signed electronically.

## 2016-08-29 NOTE — Progress Notes (Signed)
Report to PACU, RN, vss, BBS= Clear.  

## 2016-08-29 NOTE — Patient Instructions (Signed)
YOU HAD AN ENDOSCOPIC PROCEDURE TODAY AT [facility]:   Refer to the procedure report that was given to you for any specific questions about what was found during the examination.  If the procedure report does not answer your questions, please call your gastroenterologist to clarify.  If you requested that your care partner not be given the details of your procedure findings, then the procedure report has been included in a sealed envelope for you to review at your convenience later.  YOU SHOULD EXPECT: Some feelings of bloating in the abdomen. Passage of more gas than usual.  Walking can help get rid of the air that was put into your GI tract during the procedure and reduce the bloating. If you had a lower endoscopy (such as a colonoscopy or flexible sigmoidoscopy) you may notice spotting of blood in your stool or on the toilet paper. If you underwent a bowel prep for your procedure, you may not have a normal bowel movement for a few days.  Please Note:  You might notice some irritation and congestion in your nose or some drainage.  This is from the oxygen used during your procedure.  There is no need for concern and it should clear up in a day or so.  SYMPTOMS TO REPORT IMMEDIATELY:   Following lower endoscopy (colonoscopy or flexible sigmoidoscopy):  Excessive amounts of blood in the stool  Significant tenderness or worsening of abdominal pains  Swelling of the abdomen that is new, acute  Fever of 100F or higher   Following upper endoscopy (EGD)  Vomiting of blood or coffee ground material  New chest pain or pain under the shoulder blades  Painful or persistently difficult swallowing  New shortness of breath  Fever of 100F or higher  Black, tarry-looking stools  For urgent or emergent issues, a gastroenterologist can be reached at any hour by calling [phone].   DIET:  We do recommend a small meal at first, but then you may proceed to your regular diet.  Drink  plenty of fluids but you should avoid alcoholic beverages for 24 hours.  ACTIVITY:  You should plan to take it easy for the rest of today and you should NOT DRIVE or use heavy machinery until tomorrow (because of the sedation medicines used during the test).    FOLLOW UP: Our staff will call the number listed on your records the next business day following your procedure to check on you and address any questions or concerns that you may have regarding the information given to you following your procedure. If we do not reach you, we will leave a message.  However, if you are feeling well and you are not experiencing any problems, there is no need to return our call.  We will assume that you have returned to your regular daily activities without incident.  If any biopsies were taken you will be contacted by phone or by letter within the next 1-3 weeks.  Please call us at [phone] if you have not heard about the biopsies in 3 weeks.    SIGNATURES/CONFIDENTIALITY: You and/or your care partner have signed paperwork which will be entered into your electronic medical record.  These signatures attest to the fact that that the information above on your After Visit Summary has been reviewed and is understood.  Full responsibility of the confidentiality of this discharge information lies with you and/or your care-partner.

## 2016-08-30 ENCOUNTER — Telehealth: Payer: Self-pay

## 2016-08-30 NOTE — Telephone Encounter (Signed)
  Follow up Call-  Call back number 08/29/2016  Post procedure Call Back phone  # [phone]  Permission to leave phone message Yes  Some recent data might be hidden     Patient questions:  Do you have a fever, pain , or abdominal swelling? No. Pain Score  0 *  Have you tolerated food without any problems? Yes.    Have you been able to return to your normal activities? Yes.    Do you have any questions about your discharge instructions: Diet   No. Medications  No. Follow up visit  No.  Do you have questions or concerns about your Care? No.  Actions: * If pain score is 4 or above: No action needed, pain <4.

## 2016-09-01 ENCOUNTER — Encounter: Payer: Self-pay | Admitting: Internal Medicine

## 2016-09-08 ENCOUNTER — Telehealth: Payer: Self-pay | Admitting: Internal Medicine

## 2016-09-08 NOTE — Telephone Encounter (Signed)
Results letter reviewed with pt and her questions were answered.

## 2016-10-17 ENCOUNTER — Other Ambulatory Visit: Payer: Self-pay | Admitting: Internal Medicine

## 2016-10-17 DIAGNOSIS — R160 Hepatomegaly, not elsewhere classified: Secondary | ICD-10-CM

## 2016-10-27 ENCOUNTER — Ambulatory Visit
Admission: RE | Admit: 2016-10-27 | Discharge: 2016-10-27 | Disposition: A | Discharge status: Home / Self Care | Payer: 59 | Source: Ambulatory Visit | Attending: Internal Medicine | Admitting: Internal Medicine

## 2016-10-27 DIAGNOSIS — R160 Hepatomegaly, not elsewhere classified: Secondary | ICD-10-CM

## 2016-10-27 MED ORDER — GADOXETATE DISODIUM 0.25 MMOL/ML IV SOLN
6.0000 mL | Freq: Once | INTRAVENOUS | Status: AC | PRN
  Administered 2016-10-27: 6 mL via INTRAVENOUS

## 2016-11-03 ENCOUNTER — Telehealth: Payer: Self-pay | Admitting: Internal Medicine

## 2016-11-06 NOTE — Telephone Encounter (Signed)
Patient is advised. Referral to Dr Mervin Kung with [facility] [phone]. Records faxed per the office request [phone]. They will contact the patient and think they may be able to get her in for consultation next week. Patient given this information.

## 2016-11-06 NOTE — Telephone Encounter (Signed)
MRI reviewed Patient has a large hepatic adenoma and given the size this should be considered for resection This patient does have a history of [place] 2 gene mutation but no history of chronic liver disease I recommend she be referred to Dr. Sarajane Marek at [facility] for consideration of hepatic adenoma resection This referral can be placed. Please notify patient and Dr. Virgina Jock

## 2016-11-08 DIAGNOSIS — K769 Liver disease, unspecified: Secondary | ICD-10-CM | POA: Insufficient documentation

## 2017-02-16 ENCOUNTER — Encounter: Payer: 59 | Admitting: Internal Medicine

## 2017-03-09 ENCOUNTER — Encounter: Payer: Self-pay | Admitting: Internal Medicine

## 2017-03-09 ENCOUNTER — Ambulatory Visit (INDEPENDENT_AMBULATORY_CARE_PROVIDER_SITE_OTHER): Payer: 59 | Admitting: Internal Medicine

## 2017-03-09 DIAGNOSIS — Z7185 Encounter for immunization safety counseling: Secondary | ICD-10-CM

## 2017-03-09 DIAGNOSIS — Z9189 Other specified personal risk factors, not elsewhere classified: Secondary | ICD-10-CM

## 2017-03-09 DIAGNOSIS — Z789 Other specified health status: Secondary | ICD-10-CM

## 2017-03-09 DIAGNOSIS — Z7184 Encounter for health counseling related to travel: Secondary | ICD-10-CM | POA: Insufficient documentation

## 2017-03-09 DIAGNOSIS — Z7189 Other specified counseling: Secondary | ICD-10-CM

## 2017-03-09 MED ORDER — AZITHROMYCIN 500 MG PO TABS: 1000.0000 mg | ORAL_TABLET | Freq: Once | ORAL | 0 refills | Status: AC

## 2017-03-09 MED ORDER — ATOVAQUONE-PROGUANIL HCL 250-100 MG PO TABS: 1.0000 | ORAL_TABLET | Freq: Every day | ORAL | 0 refills | Status: DC

## 2017-03-09 NOTE — Progress Notes (Signed)
Subjective:   Dawn Boyd is a 47 y.o. female who presents to the Infectious Disease clinic for travel consultation. Planned departure date: April 02, 2017          Planned return date: 5 days Countries of travel: [place] Areas in country: resort   Accommodations: hotel Purpose of travel: vacation Prior travel out of [place]: yes     Objective:   Medications: reviewed   Assessment:   No contraindications to travel. None; no spleen, reports her PCP has given her all encapsulated vaccines appropriately; will use malaria prophylaxis for her and husband, kids also will get.     Plan:    Issues discussed: environmental concerns, future shots, insect-borne illnesses, malaria, motion sickness, MVA safety, rabies, safe food/water, traveler's diarrhea, website/handouts for more information, what to do if ill upon return and what to do if ill while there. Immunizations recommended: none indicated. Malaria prophylaxis: malarone, daily dose starting 1-2 days before entering endemic area, ending 7 days after leaving area Traveler's diarrhea prophylaxis: azithromycin. Total duration of visit: 1 Hour. Total time spent on education, counseling, coordination of care: 30 Minutes.

## 2017-03-15 ENCOUNTER — Telehealth: Payer: Self-pay | Admitting: *Deleted

## 2017-03-15 NOTE — Telephone Encounter (Signed)
Patient called and advised she was seen as a travel patient and forgot to ask Dr Linus Salmons about Rx for Doxy for she and husband. She wants to know if he would give them a Rx of Doxy just in case their stomachs become upset. She advised she is very sensitive and usually get diarrhea. She advised they leave 04/03/17 and if he will he can send to [facility] on [address]. Advised will send a message and give her a call back once he responds.

## 2017-03-15 NOTE — Telephone Encounter (Signed)
I prescribed azithromycin for traveler's diarrhea, if needed.  I would not recommend doxycycline for upset stomach as it could make it worse and would not help.  As discussed, for diarrhea, can take OTC medications such as immodium, Pepto.

## 2017-03-20 NOTE — Telephone Encounter (Signed)
Left a message for the patient to call the office back for response.

## 2017-05-30 ENCOUNTER — Telehealth: Payer: Self-pay | Admitting: *Deleted

## 2017-05-30 NOTE — Telephone Encounter (Signed)
Patient seen for travel advice 2/22, traveled to [place] 9/98.   She is calling regarding Dr Comer's advice to get immunization for Hepatitis A.  She states her primary care doctor does not carry that vaccination, wants to either come here for immunization or have a prescription sent to pharmacy. No notes regarding Hepatitis A recommendation. Please advise. She has upcoming travel scheduled to [place]. Landis Gandy, RN

## 2017-05-31 NOTE — Telephone Encounter (Signed)
Yes, she needs it now.  Just wasn't indicated for previous trip.  Can just do nurse visit if she wants.

## 2017-06-01 NOTE — Telephone Encounter (Signed)
Left her a message asking to call back and schedule nurse visit for Hepatitis A #1 at her convenience.

## 2017-06-14 ENCOUNTER — Ambulatory Visit (INDEPENDENT_AMBULATORY_CARE_PROVIDER_SITE_OTHER): Payer: 59

## 2017-06-14 DIAGNOSIS — Z9189 Other specified personal risk factors, not elsewhere classified: Secondary | ICD-10-CM

## 2017-06-14 DIAGNOSIS — Z7184 Encounter for health counseling related to travel: Secondary | ICD-10-CM

## 2017-06-14 DIAGNOSIS — Z23 Encounter for immunization: Secondary | ICD-10-CM

## 2017-06-14 DIAGNOSIS — Z7189 Other specified counseling: Secondary | ICD-10-CM

## 2017-06-14 DIAGNOSIS — Z789 Other specified health status: Secondary | ICD-10-CM | POA: Diagnosis not present

## 2017-06-14 DIAGNOSIS — Z7185 Encounter for immunization safety counseling: Secondary | ICD-10-CM

## 2017-06-27 ENCOUNTER — Ambulatory Visit: Payer: 59 | Admitting: Neurology

## 2017-08-04 ENCOUNTER — Emergency Department (HOSPITAL_COMMUNITY)
Admission: EM | Admit: 2017-08-04 | Discharge: 2017-08-04 | Disposition: A | Discharge status: Home / Self Care | Payer: 59 | Attending: Emergency Medicine | Admitting: Emergency Medicine

## 2017-08-04 ENCOUNTER — Other Ambulatory Visit: Payer: Self-pay

## 2017-08-04 ENCOUNTER — Encounter (HOSPITAL_COMMUNITY): Payer: Self-pay

## 2017-08-04 DIAGNOSIS — R040 Epistaxis: Secondary | ICD-10-CM

## 2017-08-04 DIAGNOSIS — Z87891 Personal history of nicotine dependence: Secondary | ICD-10-CM | POA: Diagnosis not present

## 2017-08-04 DIAGNOSIS — R110 Nausea: Secondary | ICD-10-CM | POA: Insufficient documentation

## 2017-08-04 MED ORDER — OXYMETAZOLINE HCL 0.05 % NA SOLN
1.0000 | Freq: Once | NASAL | Status: AC
  Administered 2017-08-04: 1 via NASAL

## 2017-08-04 MED ORDER — OXYMETAZOLINE HCL 0.05 % NA SOLN: 1.0000 | Freq: Two times a day (BID) | NASAL | 0 refills | Status: DC

## 2017-08-04 MED ORDER — OXYMETAZOLINE HCL 0.05 % NA SOLN
1.0000 | Freq: Once | NASAL | Status: AC
  Administered 2017-08-04: 1 via NASAL
  Filled 2017-08-04: qty 15

## 2017-08-04 NOTE — ED Notes (Addendum)
Pt was given one spray of Afrin per nare to assist with stopping the nose bleed. Blood is coming from nose and mouth at this time. Pt is using yanker to assist with suctioning. EDP is aware of pt's active nose bleed. Pt also has been given a cup of water to swish and spit.

## 2017-08-04 NOTE — ED Provider Notes (Signed)
[facility] DEPT Provider Note   CSN: [id number] Arrival date & time: 08/04/17  5974     History   Chief Complaint Chief Complaint  Patient presents with  . Epistaxis    HPI Dawn Boyd is a 47 y.o. female who presents emergency department today for epistaxis.  Patient reports that she has a history of deviated septum approximately 15 years ago that required surgery.  She started having nosebleeds approximate 1 week ago and was seen by [facility] (Dr. Melony Overly) where she had a cauterization done.  She reports since that time she is been doing well for the last night she aggravated the area manually and it started bleeding.  She reports that she had bleeding for approximately 15-30 minutes which was relieved by pressure, Afrin, suction.  She reports she has not had any bleeding for approximately 3 hours.  She reports she did have some nausea from the bleeding but this has resolved.  She denies any lightheadedness, dizziness, emesis or headache.  She reports that she did not have bleeding out of both nares or down the posterior pharynx.  Patient is not on blood thinners.   HPI  Past Medical History:  Diagnosis Date  . Allergy   . Anxiety    situational  . Arthritis    "clicking in the neck" on occasion   . Blood dyscrasia    thrombocytopenia-age 36, resolved, told that she is completely cured    . Blood transfusion    pt states age 75 in [place] had problems with blood clotting, no further problems  . Blood transfusion without reported diagnosis   . BRCA2 positive 09/08/2011   pt. has hx./ mother has hx. of ovarian cancer-no cancers for patient  . Bronchitis   . Clotting disorder ([place])    THROMOCYTOPENIA  AGE 22  . Depression    AFTER CHILD BIRTH  . Gallbladder polyp   . GERD (gastroesophageal reflux disease)    tums prn  . Gilbert's syndrome    "Liver doesn't process bilirubin"  . Headache(784.0)    migraines-usually once weekly,  really  bad q month or two  . Heart murmur    no problems, states she has been told that it is not audible at all times   . IBS (irritable bowel syndrome)   . Osteopenia    MILD  . PONV (postoperative nausea and vomiting)   . Seasonal allergies   . 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 Voiding difficulty    history of post procedure difficulty voiding    Patient Active Problem List   Diagnosis Date Noted  . Travel advice encounter 03/09/2017  . Migraine without aura and without status migrainosus, not intractable 02/28/2016  . S/P splenectomy 10/28/2013  . Splenic artery aneurysm ([place]) 09/04/2013  . BRCA2 positive 09/08/2011    Past Surgical History:  Procedure Laterality Date  . ABDOMINAL HYSTERECTOMY    . LAPAROSCOPIC ASSISTED VAGINAL HYSTERECTOMY  11/22/2010   Procedure: LAPAROSCOPIC ASSISTED VAGINAL HYSTERECTOMY;  Surgeon: Arloa Koh;  Location: [facility];  Service: Gynecology;  Laterality: N/A;  . LAPAROSCOPIC SPLENECTOMY N/A 10/28/2013   Procedure: LAPAROSCOPIC SPLENECTOMY;  Surgeon: Coralie Keens, MD;  Location: [facility];  Service: General;  Laterality: N/A;  . MASTECTOMY Bilateral 03/08/2016    BRCA2 mutation   . NASAL SEPTUM SURGERY  2007  . RADIOLOGY WITH ANESTHESIA N/A 09/29/2013   Procedure: EMBOLIZATION ;  Surgeon: Azzie Roup, MD;  Location: [facility];  Service: Radiology;  Laterality: N/A;  . SALPINGOOPHORECTOMY  11/22/2010   Procedure: SALPINGO OOPHERECTOMY;  Surgeon: Arloa Koh;  Location: [facility];  Service: Gynecology;  Laterality: Bilateral;  . TOTAL MASTECTOMY Bilateral 03/08/2016   Procedure: BILATERAL MASTECTOMIES;  Surgeon: Autumn Messing III, MD;  Location: [facility];  Service: General;  Laterality: Bilateral;  . VAGINAL DELIVERY     x2's      OB History   None      Home Medications    Prior to Admission medications   Medication Sig Start Date End Date Taking? Authorizing Provider  aspirin-acetaminophen-caffeine (EXCEDRIN MIGRAINE)  [phone] MG tablet Take 1 tablet by mouth daily as needed for migraine.    [provider]  atovaquone-proguanil (MALARONE) 250-100 MG TABS tablet Take 1 tablet by mouth daily. Start 2 days prior to travel to malaria area, throughout travel and for 7 days upon return. 03/09/17   Thayer Headings, MD  ibuprofen (ADVIL,MOTRIN) 200 MG tablet Take 200 mg by mouth as needed for headache.     [provider]  naratriptan (AMERGE) 2.5 MG tablet Take 1/2 to 1 tablet as needed for migraine. Do not take more than 3 a week 06/27/16   Cameron Sprang, MD    Family History Family History  Problem Relation Age of Onset  . Hypertension Mother   . Cancer Mother   . Colon cancer Neg Hx   . Esophageal cancer Neg Hx   . Pancreatic cancer Neg Hx   . Rectal cancer Neg Hx     Social History Social History   Tobacco Use  . Smoking status: Former Smoker    Types: Cigars    Last attempt to quit: 10/23/1993    Years since quitting: 23.7  . Smokeless tobacco: Never Used  . Tobacco comment: During college smoked cigars  Substance Use Topics  . Alcohol use: Yes    Alcohol/week: 4.2 oz    Types: 7 Cans of beer per week    Comment: social beer/ mixed drink, using less now  . Drug use: No     Allergies   Lactose intolerance (gi) and Tape   Review of Systems Review of Systems  All other systems reviewed and are negative.    Physical Exam Updated Vital Signs BP 126/90 (BP Location: Right Arm)   Pulse 65   Temp 98.1 F (36.7 C) (Oral)   Resp 16   Ht _0  (1.6 m)   Wt 53.1 kg (117 lb)   LMP 11/01/2010   SpO2 100%   BMI 20.73 kg/m   Physical Exam  Constitutional: She appears well-developed and well-nourished.  HENT:  Head: Normocephalic and atraumatic.  Right Ear: External ear normal.  Left Ear: External ear normal.  Nose: No septal deviation. Epistaxis is observed.  Patient with dried scab in right nare near area of kessielbachs plexus.  No epistaxis out of left nare.   No bleeding down the posterior pharynx.  Eyes: Conjunctivae are normal. Right eye exhibits no discharge. Left eye exhibits no discharge. No scleral icterus.  Pulmonary/Chest: Effort normal. No respiratory distress.  Neurological: She is alert.  Skin: Skin is warm, dry and intact. Capillary refill takes less than 2 seconds. No pallor.  Psychiatric: She has a normal mood and affect.  Nursing note and vitals reviewed.    ED Treatments / Results  Labs (all labs ordered are listed, but only abnormal results are displayed) Labs Reviewed - No data to display  EKG None  Radiology No results found.  Procedures Procedures (including critical care time)  Medications Ordered in ED Medications  oxymetazoline (AFRIN) 0.05 % nasal spray 1 spray (has no administration in time range)  oxymetazoline (AFRIN) 0.05 % nasal spray 1 spray (1 spray Each Nare Given 08/04/17 0554)     Initial Impression / Assessment and Plan / ED Course  I have reviewed the triage vital signs and the nursing notes.  Pertinent labs & imaging results that were available during my care of the patient were reviewed by me and considered in my medical decision making (see chart for details).     47 y.o. female who presents emergency department today for what appears to be anterior epistaxis.  Patient with now scab over area near Kesselbach's plexus.  Denies bleeding out of both nares and there is no bleeding down the posterior pharynx.  Patient reports she did have cauterization of the area 1 week ago by her ENT, Dr. Lucia Gaskins.  She reports this onset after manual trauma to the area.  Bleeding lasted for less than 30 minutes.  It is resolved for the last several hours.  Her vital signs have remained stable.  She is not on any blood thinners.  She denies any lightheadedness, dizziness, emesis, headache.  No blood work indicated at this time.  Will discharge home with Afrin and instructions to not blow her nose and avoid any  manipulation of the nose.  Bleeding is to start again she is to use Afrin x2 and hold pressure for 30 minutes.  She can also use ice packs.  Patient has follow-up with ENT on Monday.  Return precautions discussed.  She appears safe for discharge.  Patient case discussed with Dr. Rex Kras who is in agreement with plan.  Final Clinical Impressions(s) / ED Diagnoses   Final diagnoses:  Epistaxis    ED Discharge Orders        Ordered    oxymetazoline [facility] NASAL SPRAY) 0.05 % nasal spray  2 times daily     08/04/17 0825       Jillyn Ledger, PA-C 08/04/17 0831    Little, Wenda Overland, MD 08/04/17 [phone]

## 2017-08-04 NOTE — Discharge Instructions (Signed)
NOSE BLEED You may use nasal saline to keep your mucous membranes moist. You may use a humidifier.  Other than nasal saline, please do not put anything into your nose for the next 3-4 days. Please do not blow your nose for the next 3-4 days. If your nose begins to bleed again, at this time it is okay to blow your nose and blow out all of the clots and then spray Afrin nasal spray x2  into both nostrils and hold direct pressure for 30 minutes without stopping. Your can also place an ice pack on yoru face in order to help constrict the blood vessel. Do not tilt your head back while holding direct pressure as this may lead to nausea. If this does not stop the bleeding, please return to the hospital.  You will need to follow up with your ear nose and throat doctor on Monday, 08/06/17  Get help right away if: You have a nosebleed after a fall or a head injury. Your nosebleed does not go away after 20 minutes. You feel dizzy or weak. You have unusual bleeding from other parts of your body. You have unusual bruising on other parts of your body. You become sweaty. You vomit blood.

## 2017-08-04 NOTE — ED Notes (Signed)
Bed: DT14 Expected date:  Expected time:  Means of arrival:  Comments: EMS 47 yo female nosebleed/headache/hx migraines

## 2017-08-04 NOTE — ED Triage Notes (Signed)
Per [facility], pt arrives today with c/o active nose bleed. It has been bleeding for 15 minutes, unable to stop. She had a migraine this morning and took an 81 mg ASA. Per EMS, pt has frequent episodes of nose bleeds.   18G left AC.

## 2017-08-23 ENCOUNTER — Other Ambulatory Visit: Payer: Self-pay | Admitting: Otolaryngology

## 2017-08-23 ENCOUNTER — Ambulatory Visit
Admission: RE | Admit: 2017-08-23 | Discharge: 2017-08-23 | Disposition: A | Discharge status: Home / Self Care | Payer: 59 | Source: Ambulatory Visit | Attending: Otolaryngology | Admitting: Otolaryngology

## 2017-08-23 DIAGNOSIS — J329 Chronic sinusitis, unspecified: Secondary | ICD-10-CM

## 2017-08-23 DIAGNOSIS — G43009 Migraine without aura, not intractable, without status migrainosus: Secondary | ICD-10-CM

## 2017-10-02 ENCOUNTER — Other Ambulatory Visit: Payer: Self-pay | Admitting: Neurology

## 2017-10-02 DIAGNOSIS — G43009 Migraine without aura, not intractable, without status migrainosus: Secondary | ICD-10-CM

## 2018-01-15 ENCOUNTER — Ambulatory Visit (INDEPENDENT_AMBULATORY_CARE_PROVIDER_SITE_OTHER): Payer: 59

## 2018-01-15 DIAGNOSIS — Z23 Encounter for immunization: Secondary | ICD-10-CM | POA: Diagnosis not present

## 2018-01-15 NOTE — Progress Notes (Signed)
Patient received Hep A booster vaccine today.  Patient tolerated well.

## 2018-01-29 ENCOUNTER — Ambulatory Visit: Payer: 59 | Admitting: Neurology

## 2018-01-29 ENCOUNTER — Encounter: Payer: Self-pay | Admitting: Neurology

## 2018-01-29 ENCOUNTER — Other Ambulatory Visit: Payer: Self-pay

## 2018-01-29 ENCOUNTER — Encounter

## 2018-01-29 VITALS — BP 106/68 | HR 81 | Ht 63.0 in | Wt 114.0 lb

## 2018-01-29 DIAGNOSIS — G43009 Migraine without aura, not intractable, without status migrainosus: Secondary | ICD-10-CM

## 2018-01-29 MED ORDER — GALCANEZUMAB-GNLM 120 MG/ML ~~LOC~~ SOSY: 1.0000 "pen " | PREFILLED_SYRINGE | SUBCUTANEOUS | 11 refills | Status: DC

## 2018-01-29 MED ORDER — NARATRIPTAN HCL 2.5 MG PO TABS: ORAL_TABLET | ORAL | 11 refills | Status: DC

## 2018-01-29 NOTE — Progress Notes (Signed)
NEUROLOGY FOLLOW UP OFFICE NOTE  Dawn Boyd [id number] 23-Jul-1970  HISTORY OF PRESENT ILLNESS: I had the pleasure of seeing Dawn Boyd in follow-up in the neurology clinic on 01/29/2018.  The patient was last seen in June 2018 for migraines. MRI brain normal. She is very sensitive to medications and had side effects on an antidepressant started for migraine prevention in the past, and has been hesitant to start one. She continues to report 2-3 migraines a week, usually triggered by activity such as playing tennis. She usually premedicates by taking 1/2 tablet of Aleve before tennis, then usually in the afternoon she would have a migraine lasting 5 hours if she does not take something right away. She has naratriptan and also takes another Aleve dose. The naratriptan helps, but after 2 hours she usually feels anxious and tense, much more irritated, sometimes lasting until the next day. If she drinks alcohol socially, the next day she would predictably wake up with a migraine. Migraine associated with nausea and photosensitivity. Perfumes, dehydration are triggers. She denies any dizziness, focal numbness/tingling/weakness. No falls.   History on Initial Assessment 02/28/2016: This is a very pleasant 48 yo RH woman with a history of migraines since 2003, who presented for a second opinion on new approaches to her migraines. who presented for a second opinion on new approaches to her migraines. Migraines started after she gave birth to her son in 2003. They did get better after she had a hysterectomy 5 years ago, occurring every 2 weeks or so, however over the past 6 months, she has been having them more frequently, around 2-3 times a week. Migraines are over the frontal and vertex regions, sometimes behind her ears, she has throbbing pain with associated nausea if the headaches become really bad. She has really bad 10/10 ones where medications do not help much around once a month, lasting 2-3 days. She would take prn medication but headaches return. The migraines 2-3 times a  week are around 6/10, usually relieved by 1/2 tablet of naratriptan combined with Excedrin migraine No visual obscurations, she has some photo and phonophobia. She has found playing tennis to be a trigger, she plays it twice a week and consistently gets a migraine, so she would preventatively take naratriptan and Excedrin before playing. Other triggers include perfume, chocolates, wine, cheese, poor sleep, skipping meals. She has found that drinking coffee helps, she usually drinks 1 cup of coffee daily. She reports hitting her head twice this year, with the last one she had worsened headaches which have resolved. She has had right-sided tinnitus for the past year, ENT evaluation unremarkable. She had an MRI brain without contrast last 08/2015 which I personally reviewed, no acute changes seen, within normal limits. She previously took Maxalt which helped the migraine immediately, but did not last long enough, she would have migraine recurrence. She was switched to naratriptan, which does last longer, but takes a longer time to take effect. In the past she took an unrecalled antidepressant for migraine prevention, but had side effects of palpitations, which made her hesitant about preventative medications.  She denies any dizziness, diplopia, dysarthria/dysphagia, focal numbness/tingling/weakness, bowel/bladder dysfunction. She has some neck pain, at times with shooting pain in the back of her head, and feels that her migraines may be coming from her neck. She usually gets 7-8 hours of good sleep. There is a family history of migraines in her mother and sister. Her maternal grandmother had several strokes. She has a strong family history of cancer and was positive for BRCA 2 gene mutation, and underwent  bilateral mastectomy.  PAST MEDICAL HISTORY: Past Medical History:  Diagnosis Date  . Allergy   . Anxiety    situational  . Arthritis    "clicking in the neck" on occasion   . Blood dyscrasia     thrombocytopenia-age 48, resolved, told that she is completely cured    . Blood transfusion    pt states age 48 in [place] had problems with blood clotting, no further problems  . Blood transfusion without reported diagnosis   . BRCA2 positive 09/08/2011   pt. has hx./ mother has hx. of ovarian cancer-no cancers for patient  . Bronchitis   . Clotting disorder ([place])    THROMOCYTOPENIA  AGE 48  . Depression    AFTER CHILD BIRTH  . Gallbladder polyp   . GERD (gastroesophageal reflux disease)    tums prn  . Gilbert's syndrome    "Liver doesn't process bilirubin"  . Headache(784.0)    migraines-usually once weekly, really  bad q month or two  . Heart murmur    no problems, states she has been told that it is not audible at all times   . IBS (irritable bowel syndrome)   . Osteopenia    MILD  . PONV (postoperative nausea and vomiting)   . Seasonal allergies   . 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 Voiding difficulty    history of post procedure difficulty voiding    MEDICATIONS: Current Outpatient Medications on File Prior to Visit  Medication Sig Dispense Refill  . aspirin-acetaminophen-caffeine (EXCEDRIN MIGRAINE) 250-250-65 MG tablet Take 1 tablet by mouth daily as needed for migraine.    Marland Kitchen atovaquone-proguanil (MALARONE) 250-100 MG TABS tablet Take 1 tablet by mouth daily. Start 2 days prior to travel to malaria area, throughout travel and for 7 days upon return. 14 tablet 0  . ibuprofen (ADVIL,MOTRIN) 200 MG tablet Take 200 mg by mouth as needed for headache.     . naratriptan (AMERGE) 2.5 MG tablet TAKE 1/2 TO 1 TABLET BY MOUTH IF NEEDED FOR MIGRAINE, DO NOT TAKE MORE THAN 3 TABLETS A WEEK 10 tablet 3  . oxymetazoline (AFRIN NASAL SPRAY) 0.05 % nasal spray Place 1 spray into both nostrils 2 (two) times daily. 30 mL 0   Current Facility-Administered Medications on File Prior to Visit  Medication Dose Route Frequency Provider Last Rate Last Dose  . 0.9 %  sodium  chloride infusion  500 mL Intravenous Continuous Pyrtle, Lajuan Lines, MD        ALLERGIES: Allergies  Allergen Reactions  . Lactose Intolerance (Gi) Diarrhea, Nausea Only and Other (See Comments)    Bloating   . Tape Dermatitis    Skin redness from the glue.    FAMILY HISTORY: Family History  Problem Relation Age of Onset  . Hypertension Mother   . Cancer Mother   . Colon cancer Neg Hx   . Esophageal cancer Neg Hx   . Pancreatic cancer Neg Hx   . Rectal cancer Neg Hx     SOCIAL HISTORY: Social History   Socioeconomic History  . Marital status: Married    Spouse name: Not on file  . Number of children: 2  . Years of education: Not on file  . Highest education level: Not on file  Occupational History  . Occupation: Artist  Social Needs  . Financial resource strain: Not on file  . Food insecurity:    Worry: Not on file    Inability: Not on file  .  Transportation needs:    Medical: Not on file    Non-medical: Not on file  Tobacco Use  . Smoking status: Former Smoker    Types: Cigars    Last attempt to quit: 10/23/1993    Years since quitting: 24.2  . Smokeless tobacco: Never Used  . Tobacco comment: During college smoked cigars  Substance and Sexual Activity  . Alcohol use: Yes    Alcohol/week: 7.0 standard drinks    Types: 7 Cans of beer per week    Comment: social beer/ mixed drink, using less now  . Drug use: No  . Sexual activity: Yes  Lifestyle  . Physical activity:    Days per week: Not on file    Minutes per session: Not on file  . Stress: Not on file  Relationships  . Social connections:    Talks on phone: Not on file    Gets together: Not on file    Attends religious service: Not on file    Active member of club or organization: Not on file    Attends meetings of clubs or organizations: Not on file    Relationship status: Not on file  . Intimate partner violence:    Fear of current or ex partner: Not on file    Emotionally abused: Not on file     Physically abused: Not on file    Forced sexual activity: Not on file  Other Topics Concern  . Not on file  Social History Narrative  . Not on file    REVIEW OF SYSTEMS: Constitutional: No fevers, chills, or sweats, no generalized fatigue, change in appetite Eyes: No visual changes, double vision, eye pain Ear, nose and throat: No hearing loss, ear pain, nasal congestion, sore throat Cardiovascular: No chest pain, palpitations Respiratory:  No shortness of breath at rest or with exertion, wheezes GastrointestinaI: No nausea, vomiting, diarrhea, abdominal pain, fecal incontinence Genitourinary:  No dysuria, urinary retention or frequency Musculoskeletal:  No neck pain, back pain Integumentary: No rash, pruritus, skin lesions Neurological: as above Psychiatric: No depression, insomnia, anxiety Endocrine: No palpitations, fatigue, diaphoresis, mood swings, change in appetite, change in weight, increased thirst Hematologic/Lymphatic:  No anemia, purpura, petechiae. Allergic/Immunologic: no itchy/runny eyes, nasal congestion, recent allergic reactions, rashes  PHYSICAL EXAM: Vitals:   01/29/18 0842  BP: 106/68  Pulse: 81  SpO2: 99%   General: No acute distress Head:  Normocephalic/atraumatic Neck: supple, no paraspinal tenderness, full range of motion Heart:  Regular rate and rhythm Lungs:  Clear to auscultation bilaterally Back: No paraspinal tenderness Skin/Extremities: No rash, no edema Neurological Exam: alert and oriented to person, place, and time. No aphasia or dysarthria. Fund of knowledge is appropriate.  Recent and remote memory are intact.  Attention and concentration are normal.    Able to name objects and repeat phrases. Cranial nerves: Pupils equal, round, reactive to light. Extraocular movements intact with no nystagmus. Visual fields full. Facial sensation intact. No facial asymmetry. Tongue, uvula, palate midline.  Motor: Bulk and tone normal, muscle strength 5/5  throughout with no pronator drift.  Sensation to light touch intact.  No extinction to double simultaneous stimulation.  Deep tendon reflexes 2+ throughout, toes downgoing.  Finger to nose testing intact.  Gait narrow-based and steady, able to tandem walk adequately.  Romberg negative.  IMPRESSION: This is a pleasant 48 yo RH woman with a history of migraines without aura. She continues to report 2-3 migraines a week, usually triggered by activity or occasional alcohol intake.  Her neurological exam and MRI brain normal. She is very sensitive to oral medications and after side effects from an antidepressant started for migraine prevention, she has been hesitant. We discussed starting Emgality for migraine prophylaxis, since it is a subcutaneous medication, would potentially have less of the systemic side effects she had with oral medications. Continue prn naratriptan and Aleve for migraine rescue, she knows to minimize to 2-3 a week to avoid rebound headaches. Continue migraine calendar, she will follow-up in 6 months and knows to call for any changes.   Thank you for allowing me to participate in her care.  Please do not hesitate to call for any questions or concerns.  The duration of this appointment visit was 30 minutes of face-to-face time with the patient.  Greater than 50% of this time was spent in counseling, explanation of diagnosis, planning of further management, and coordination of care.   Ellouise Newer, M.D.   CC: Dr. Virgina Jock

## 2018-01-29 NOTE — Patient Instructions (Signed)
1. Let's try to start Emgality once a month and see if this helps better with migraine prevention  2. Continue naratriptan as needed, do not take more than 2-3 times a week  3. Continue migraine diary  4. Follow-up in 6 months, call for any changes

## 2018-01-31 NOTE — Progress Notes (Signed)
Emgality PA initiated on cover [employer] [id number]

## 2018-02-01 NOTE — Progress Notes (Signed)
Received notice from [employer] that pt's EMGALITY has been   Approved 01/31/2018 through 05/02/2018

## 2018-02-04 ENCOUNTER — Other Ambulatory Visit: Payer: Self-pay | Admitting: General Surgery

## 2018-02-04 DIAGNOSIS — R160 Hepatomegaly, not elsewhere classified: Secondary | ICD-10-CM

## 2018-02-15 ENCOUNTER — Ambulatory Visit
Admission: RE | Admit: 2018-02-15 | Discharge: 2018-02-15 | Disposition: A | Discharge status: Home / Self Care | Payer: 59 | Source: Ambulatory Visit | Attending: General Surgery | Admitting: General Surgery

## 2018-02-15 DIAGNOSIS — R160 Hepatomegaly, not elsewhere classified: Secondary | ICD-10-CM

## 2018-02-15 MED ORDER — GADOXETATE DISODIUM 0.25 MMOL/ML IV SOLN
5.0000 mL | Freq: Once | INTRAVENOUS | Status: AC | PRN
  Administered 2018-02-15: 5 mL via INTRAVENOUS

## 2018-02-19 NOTE — Progress Notes (Signed)
Please let patient know area in liver looks unchanged and benign.  Can discuss more at visit.

## 2018-05-09 ENCOUNTER — Telehealth: Payer: Self-pay | Admitting: *Deleted

## 2018-05-09 ENCOUNTER — Encounter: Payer: Self-pay | Admitting: *Deleted

## 2018-05-09 NOTE — Progress Notes (Addendum)
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   When the questions became available I call Syan for her to answer such as if there was a decrease in headaches during the first 3 months of treatment. She said she does not want to continue taking this medication and she will see Dr. Delice Lesch in 1 year for follow up. She said she will call to make that appointment. So I deleted the request for her.

## 2018-05-09 NOTE — Telephone Encounter (Signed)
I started the prior auth so she could continue emgality after her initial 3 month trial.  When the questions became available I called  Kirah for her to answer such as if there was a decrease in headaches during the first 3 months of treatment, etc.  She said she does not want to continue taking this medication and she will see Dr. Delice Lesch in 1 year for follow up. She said she will call to make that appointment. So I deleted the request for prior authorization for her.

## 2018-05-13 NOTE — Telephone Encounter (Signed)
Noted, thanks!

## 2018-08-17 ENCOUNTER — Encounter: Payer: Self-pay | Admitting: Internal Medicine

## 2018-08-26 ENCOUNTER — Ambulatory Visit: Payer: 59 | Admitting: Neurology

## 2018-11-20 ENCOUNTER — Telehealth: Payer: Self-pay | Admitting: Neurology

## 2018-11-20 ENCOUNTER — Other Ambulatory Visit: Payer: Self-pay

## 2018-11-20 DIAGNOSIS — G43009 Migraine without aura, not intractable, without status migrainosus: Secondary | ICD-10-CM

## 2018-11-20 MED ORDER — NARATRIPTAN HCL 2.5 MG PO TABS: ORAL_TABLET | ORAL | 6 refills | Status: DC

## 2018-11-20 NOTE — Telephone Encounter (Signed)
Pt has has an appt scheduled in March. Naratriptan #10 with 6 refills sent to [facility] on [address].

## 2018-11-20 NOTE — Telephone Encounter (Signed)
Left message with the after hour service on 11-20-18 @ 12:49 pm   Caller states she needs a refill on her RX 2.5 mg narotriptin    Last seen on 01-29-18

## 2019-01-28 ENCOUNTER — Other Ambulatory Visit: Payer: Self-pay | Admitting: General Surgery

## 2019-01-28 DIAGNOSIS — R160 Hepatomegaly, not elsewhere classified: Secondary | ICD-10-CM

## 2019-03-14 ENCOUNTER — Other Ambulatory Visit: Payer: Self-pay

## 2019-03-14 ENCOUNTER — Ambulatory Visit
Admission: RE | Admit: 2019-03-14 | Discharge: 2019-03-14 | Disposition: A | Discharge status: Home / Self Care | Payer: 59 | Source: Ambulatory Visit | Attending: General Surgery | Admitting: General Surgery

## 2019-03-14 DIAGNOSIS — R160 Hepatomegaly, not elsewhere classified: Secondary | ICD-10-CM

## 2019-03-14 MED ORDER — GADOXETATE DISODIUM 0.25 MMOL/ML IV SOLN
5.0000 mL | Freq: Once | INTRAVENOUS | Status: AC | PRN
  Administered 2019-03-14: 5 mL via INTRAVENOUS

## 2019-03-24 DIAGNOSIS — K589 Irritable bowel syndrome without diarrhea: Secondary | ICD-10-CM | POA: Insufficient documentation

## 2019-03-24 DIAGNOSIS — G43909 Migraine, unspecified, not intractable, without status migrainosus: Secondary | ICD-10-CM | POA: Insufficient documentation

## 2019-03-24 DIAGNOSIS — N649 Disorder of breast, unspecified: Secondary | ICD-10-CM | POA: Insufficient documentation

## 2019-03-24 DIAGNOSIS — N951 Menopausal and female climacteric states: Secondary | ICD-10-CM | POA: Insufficient documentation

## 2019-03-25 ENCOUNTER — Ambulatory Visit: Payer: 59 | Admitting: Neurology

## 2019-03-25 ENCOUNTER — Encounter: Payer: Self-pay | Admitting: Neurology

## 2019-03-25 ENCOUNTER — Other Ambulatory Visit: Payer: Self-pay

## 2019-03-25 DIAGNOSIS — G43009 Migraine without aura, not intractable, without status migrainosus: Secondary | ICD-10-CM | POA: Diagnosis not present

## 2019-03-25 MED ORDER — NURTEC 75 MG PO TBDP: 75.0000 mg | ORAL_TABLET | Freq: Every day | ORAL | 0 refills | Status: DC

## 2019-03-25 MED ORDER — NARATRIPTAN HCL 2.5 MG PO TABS: ORAL_TABLET | ORAL | 11 refills | Status: DC

## 2019-03-25 NOTE — Patient Instructions (Signed)
1. Try Nurtec as needed at onset of migraine. Do not take more than 1 tablet every 24 hours. If it helps better with migraine compared to [place], let us know and we will send refills.  2. Amerge is available at your pharmacy for refills.  3. Continue migraine calendar.  4. Follow-up in 1 year, call for any changes.

## 2019-03-25 NOTE — Progress Notes (Signed)
[name] KeyLucita Lora - PA Case ID: [id number] - Rx #: [id number] Need help? 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

## 2019-03-25 NOTE — Progress Notes (Signed)
NEUROLOGY FOLLOW UP OFFICE NOTE  Dawn Boyd [id number] [birth year]  HISTORY OF PRESENT ILLNESS: I had the pleasure of seeing Dawn Boyd in follow-up in the neurology clinic on 03/25/2019. She was last seen over a year ago for migraines. MRI brain normal. She is very sensitive to medications and had side effects on an antidepressant started for migraine prevention in the past, making her hesitant to start preventative medication. On her last visit, she had initially agreed to start [facility], however after further consideration, she decided not to start it. She continues to have 2-3 migraines a week, consistently triggered by activity such as playing tennis or weather changes. Red wine, chocolate, cheese, and bananas have also been triggers. She usually takes an Aleve 2-3 times a week prior to tennis, and this usually helps prevent a migraine after. If she has one, she would take Amerge which works until the next day. In the past she took Maxalt which worked fasted but shorter duration of effect. She otherwise feels fine, she occasionally feels lightheaded. No focal numbness/tingling/weakness, no falls.   History on Initial Assessment 02/28/2016: This is a very pleasant 49 yo RH woman with a history of migraines since 2003, who presented for a second opinion on new approaches to her migraines. Migraines started after she gave birth to her son in 2003. They did get better after she had a hysterectomy 5 years ago, occurring every 2 weeks or so, however over the past 6 months, she has been having them more frequently, around 2-3 times a week. Migraines are over the frontal and vertex regions, sometimes behind her ears, she has throbbing pain with associated nausea if the headaches become really bad. She has really bad 10/10 ones where medications do not help much around once a month, lasting 2-3 days. She would take prn medication but headaches return. The migraines 2-3 times a week are around 6/10, usually  relieved by 1/2 tablet of naratriptan combined with Excedrin migraine No visual obscurations, she has some photo and phonophobia. She has found playing tennis to be a trigger, she plays it twice a week and consistently gets a migraine, so she would preventatively take naratriptan and Excedrin before playing. Other triggers include perfume, chocolates, wine, cheese, poor sleep, skipping meals. She has found that drinking coffee helps, she usually drinks 1 cup of coffee daily. She reports hitting her head twice this year, with the last one she had worsened headaches which have resolved. She has had right-sided tinnitus for the past year, ENT evaluation unremarkable. She had an MRI brain without contrast last 08/2015 which I personally reviewed, no acute changes seen, within normal limits. She previously took Maxalt which helped the migraine immediately, but did not last long enough, she would have migraine recurrence. She was switched to naratriptan, which does last longer, but takes a longer time to take effect. In the past she took an unrecalled antidepressant for migraine prevention, but had side effects of palpitations, which made her hesitant about preventative medications.  She denies any dizziness, diplopia, dysarthria/dysphagia, focal numbness/tingling/weakness, bowel/bladder dysfunction. She has some neck pain, at times with shooting pain in the back of her head, and feels that her migraines may be coming from her neck. She usually gets 7-8 hours of good sleep. There is a family history of migraines in her mother and sister. Her maternal grandmother had several strokes. She has a strong family history of cancer and was positive for BRCA 2 gene mutation, and underwent bilateral mastectomy.  PAST MEDICAL HISTORY: Past Medical History:  Diagnosis Date  . Allergy   . Anxiety    situational  . Arthritis    "clicking in the neck" on occasion   . Blood dyscrasia    thrombocytopenia-age 49, resolved,  told that she is completely cured    . Blood transfusion    pt states age 49 in [place] had problems with blood clotting, no further problems  . Blood transfusion without reported diagnosis   . BRCA2 positive 09/08/2011   pt. has hx./ mother has hx. of ovarian cancer-no cancers for patient  . Bronchitis   . Clotting disorder ([place])    THROMOCYTOPENIA  AGE 49  . Depression    AFTER CHILD BIRTH  . Gallbladder polyp   . GERD (gastroesophageal reflux disease)    tums prn  . Gilbert's syndrome    "Liver doesn't process bilirubin"  . Headache(784.0)    migraines-usually once weekly, really  bad q month or two  . Heart murmur    no problems, states she has been told that it is not audible at all times   . IBS (irritable bowel syndrome)   . Osteopenia    MILD  . PONV (postoperative nausea and vomiting)   . Seasonal allergies   . 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 Voiding difficulty    history of post procedure difficulty voiding    MEDICATIONS: Current Outpatient Medications on File Prior to Visit  Medication Sig Dispense Refill  . naproxen sodium (ALEVE) 220 MG tablet Take 220 mg by mouth.    . naratriptan (AMERGE) 2.5 MG tablet TAKE 1/2 TO 1 TABLET BY MOUTH IF NEEDED FOR MIGRAINE, DO NOT TAKE MORE THAN 3 TABLETS A WEEK 10 tablet 6  . Galcanezumab-gnlm (EMGALITY) 120 MG/ML SOSY Inject 1 pen into the skin every 30 (thirty) days. (Patient not taking: Reported on 03/25/2019) 1 Syringe 11   Current Facility-Administered Medications on File Prior to Visit  Medication Dose Route Frequency Provider Last Rate Last Admin  . 0.9 %  sodium chloride infusion  500 mL Intravenous Continuous Pyrtle, Lajuan Lines, MD        ALLERGIES: Allergies  Allergen Reactions  . Lactose Intolerance (Gi) Diarrhea, Nausea Only and Other (See Comments)    Bloating   . Tape Dermatitis    Skin redness from the glue.    FAMILY HISTORY: Family History  Problem Relation Age of Onset  . Hypertension  Mother   . Cancer Mother   . Colon cancer Neg Hx   . Esophageal cancer Neg Hx   . Pancreatic cancer Neg Hx   . Rectal cancer Neg Hx     SOCIAL HISTORY: Social History   Socioeconomic History  . Marital status: Married    Spouse name: Not on file  . Number of children: 2  . Years of education: Not on file  . Highest education level: Not on file  Occupational History  . Occupation: Artist  Tobacco Use  . Smoking status: Former Smoker    Types: Cigars    Quit date: 10/23/1993    Years since quitting: 25.4  . Smokeless tobacco: Never Used  . Tobacco comment: During college smoked cigars  Substance and Sexual Activity  . Alcohol use: Yes    Alcohol/week: 7.0 standard drinks    Types: 7 Cans of beer per week    Comment: social beer/ mixed drink, using less now  . Drug use: No  . Sexual activity: Yes  Other Topics Concern  . Not on file  Social History Narrative   Right handed    Social Determinants of Health   Financial Resource Strain:   . Difficulty of Paying Living Expenses: Not on file  Food Insecurity:   . Worried About Charity fundraiser in the Last Year: Not on file  . Ran Out of Food in the Last Year: Not on file  Transportation Needs:   . Lack of Transportation (Medical): Not on file  . Lack of Transportation (Non-Medical): Not on file  Physical Activity:   . Days of Exercise per Week: Not on file  . Minutes of Exercise per Session: Not on file  Stress:   . Feeling of Stress : Not on file  Social Connections:   . Frequency of Communication with Friends and Family: Not on file  . Frequency of Social Gatherings with Friends and Family: Not on file  . Attends Religious Services: Not on file  . Active Member of Clubs or Organizations: Not on file  . Attends Archivist Meetings: Not on file  . Marital Status: Not on file  Intimate Partner Violence:   . Fear of Current or Ex-Partner: Not on file  . Emotionally Abused: Not on file  . Physically  Abused: Not on file  . Sexually Abused: Not on file    REVIEW OF SYSTEMS: Constitutional: No fevers, chills, or sweats, no generalized fatigue, change in appetite Eyes: No visual changes, double vision, eye pain Ear, nose and throat: No hearing loss, ear pain, nasal congestion, sore throat Cardiovascular: No chest pain, palpitations Respiratory:  No shortness of breath at rest or with exertion, wheezes GastrointestinaI: No nausea, vomiting, diarrhea, abdominal pain, fecal incontinence Genitourinary:  No dysuria, urinary retention or frequency Musculoskeletal:  No neck pain, back pain Integumentary: No rash, pruritus, skin lesions Neurological: as above Psychiatric: No depression, insomnia, anxiety Endocrine: No palpitations, fatigue, diaphoresis, mood swings, change in appetite, change in weight, increased thirst Hematologic/Lymphatic:  No anemia, purpura, petechiae. Allergic/Immunologic: no itchy/runny eyes, nasal congestion, recent allergic reactions, rashes  PHYSICAL EXAM: Vitals:   03/25/19 1005  BP: 126/78  Pulse: 74  SpO2: 98%   General: No acute distress Head:  Normocephalic/atraumatic Skin/Extremities: No rash, no edema Neurological Exam: alert and oriented to person, place, and time. No aphasia or dysarthria. Fund of knowledge is appropriate.  Recent and remote memory are intact.  Attention and concentration are normal.  Cranial nerves: Pupils equal, round, reactive to light. Extraocular movements intact with no nystagmus. Visual fields full. No facial asymmetry  Motor: Bulk and tone normal, muscle strength 5/5 throughout with no pronator drift.  Finger to nose testing intact.  Gait narrow-based and steady, able to tandem walk adequately.   IMPRESSION: This is a pleasant 49 yo RH woman with a history of migraines without aura. She continues to report 2-3 migraines a week, usually triggered by activity or occasional alcohol intake. Her neurological exam and MRI brain normal.  She is very sensitive to oral medications and after side effects from an antidepressant started for migraine prevention, remains hesitant to start another. She has noticed Amerge takes a longer time to help and would like to try a different rescue, samples for Nurtec given today, side effects discussed. If effective, she will call our office for refills. Otherwise, prn naratriptan and Aleve are used for migraine rescue, she knows to minimize to 2-3 a week to avoid rebound headaches. Continue migraine calendar, she will follow-up in 1  year and knows to call for any changes.   Thank you for allowing me to participate in her care.  Please do not hesitate to call for any questions or concerns.   Ellouise Newer, M.D.   CC: Dr. Virgina Jock

## 2019-03-26 NOTE — Progress Notes (Signed)
Received letter from [employer] Utilization Review Entity: [facility] Stating approval valid 03/25/2019-03/24/2020  PA# [employer] - SI- PPO- [id number]  Letter sent to scan into her chart

## 2019-05-01 ENCOUNTER — Ambulatory Visit (INDEPENDENT_AMBULATORY_CARE_PROVIDER_SITE_OTHER): Payer: 59

## 2019-05-01 ENCOUNTER — Ambulatory Visit: Payer: 59 | Admitting: Podiatry

## 2019-05-01 ENCOUNTER — Other Ambulatory Visit: Payer: Self-pay

## 2019-05-01 VITALS — Temp 97.2°F

## 2019-05-01 DIAGNOSIS — M2580 Other specified joint disorders, unspecified joint: Secondary | ICD-10-CM

## 2019-05-01 DIAGNOSIS — M79671 Pain in right foot: Secondary | ICD-10-CM

## 2019-05-01 DIAGNOSIS — M7989 Other specified soft tissue disorders: Secondary | ICD-10-CM

## 2019-05-01 DIAGNOSIS — M79674 Pain in right toe(s): Secondary | ICD-10-CM

## 2019-05-02 ENCOUNTER — Other Ambulatory Visit: Payer: Self-pay | Admitting: Podiatry

## 2019-05-02 DIAGNOSIS — M2580 Other specified joint disorders, unspecified joint: Secondary | ICD-10-CM

## 2019-06-30 NOTE — Progress Notes (Signed)
  Subjective:  Patient ID: Dawn Boyd, female    DOB: 1970/02/20,  MRN: [id number]  Chief Complaint  Patient presents with  . Toe Pain    Right sub 1st digit pain from playing tennis 3-4 month duration.  . Nail Problem    Nail pain/discoloration from playing tennis. Pt states she has concerns about melanoma.   49 y.o. female presents with the above complaint. History confirmed with patient.   Objective:  Physical Exam: warm, good capillary refill, no trophic changes or ulcerative lesions, normal DP and PT pulses and normal sensory exam.  Right Foot: POP under 1st tibial sesamoid, nail dystrophy with thickening and subungual dried blood noted.   No images are attached to the encounter.  Radiographs: X-ray of the right foot: no fracture, dislocation, swelling or degenerative changes noted Assessment:   1. Sesamoiditis   2. Pain and swelling of toe, right    Plan:  Patient was evaluated and treated and all questions answered.  Sesamoiditis -XR taken and reviewed -Educated on etiology -Offloading pad dispensed  Nail dystrophy -Traumatic in nature, educated on etiology -Nail debrided  No follow-ups on file.

## 2019-07-07 ENCOUNTER — Telehealth: Payer: Self-pay | Admitting: Internal Medicine

## 2019-07-07 NOTE — Telephone Encounter (Signed)
Spoke with patient, Pt wants to know if you have come across any new information regarding lynch syndrome, patient is wanting to know if it is still recommended that she come in for her colon recall this year, pt states that she doesn't want to if it is not necessary but she will if it is still recommended. Please advise, thank you.

## 2019-07-07 NOTE — Telephone Encounter (Signed)
Patient called and has questions in reference to colonoscopy prior to scheduling her recall

## 2019-07-07 NOTE — Telephone Encounter (Signed)
Spoke with patient regarding recommendations. Pt states that she will schedule appointment when she comes in the office on Friday with her husband.

## 2019-07-07 NOTE — Telephone Encounter (Signed)
With [employer] 2 gene abnormality and the association with this genetic abnormality and Lynch syndrome, I would recommend she proceed with screening colonoscopy

## 2019-07-24 DIAGNOSIS — S52501D Unspecified fracture of the lower end of right radius, subsequent encounter for closed fracture with routine healing: Secondary | ICD-10-CM | POA: Diagnosis not present

## 2019-08-05 LAB — COLOGUARD: COLOGUARD: NEGATIVE

## 2019-08-05 LAB — EXTERNAL GENERIC LAB PROCEDURE: COLOGUARD: NEGATIVE

## 2019-08-11 ENCOUNTER — Encounter: Payer: 59 | Admitting: Internal Medicine

## 2019-08-14 DIAGNOSIS — S52501D Unspecified fracture of the lower end of right radius, subsequent encounter for closed fracture with routine healing: Secondary | ICD-10-CM | POA: Diagnosis not present

## 2019-08-28 DIAGNOSIS — S52501D Unspecified fracture of the lower end of right radius, subsequent encounter for closed fracture with routine healing: Secondary | ICD-10-CM | POA: Diagnosis not present

## 2019-09-10 DIAGNOSIS — M25631 Stiffness of right wrist, not elsewhere classified: Secondary | ICD-10-CM | POA: Diagnosis not present

## 2019-09-10 DIAGNOSIS — R29898 Other symptoms and signs involving the musculoskeletal system: Secondary | ICD-10-CM | POA: Diagnosis not present

## 2019-09-10 DIAGNOSIS — S52501D Unspecified fracture of the lower end of right radius, subsequent encounter for closed fracture with routine healing: Secondary | ICD-10-CM | POA: Diagnosis not present

## 2019-09-10 DIAGNOSIS — M25531 Pain in right wrist: Secondary | ICD-10-CM | POA: Diagnosis not present

## 2019-09-17 DIAGNOSIS — M25531 Pain in right wrist: Secondary | ICD-10-CM | POA: Diagnosis not present

## 2019-09-17 DIAGNOSIS — M25631 Stiffness of right wrist, not elsewhere classified: Secondary | ICD-10-CM | POA: Diagnosis not present

## 2019-09-17 DIAGNOSIS — S52501D Unspecified fracture of the lower end of right radius, subsequent encounter for closed fracture with routine healing: Secondary | ICD-10-CM | POA: Diagnosis not present

## 2019-09-17 DIAGNOSIS — R29898 Other symptoms and signs involving the musculoskeletal system: Secondary | ICD-10-CM | POA: Diagnosis not present

## 2019-09-24 DIAGNOSIS — S52501D Unspecified fracture of the lower end of right radius, subsequent encounter for closed fracture with routine healing: Secondary | ICD-10-CM | POA: Diagnosis not present

## 2019-09-24 DIAGNOSIS — R29898 Other symptoms and signs involving the musculoskeletal system: Secondary | ICD-10-CM | POA: Diagnosis not present

## 2019-09-24 DIAGNOSIS — M25631 Stiffness of right wrist, not elsewhere classified: Secondary | ICD-10-CM | POA: Diagnosis not present

## 2019-09-24 DIAGNOSIS — M25531 Pain in right wrist: Secondary | ICD-10-CM | POA: Diagnosis not present

## 2019-09-25 DIAGNOSIS — S52501D Unspecified fracture of the lower end of right radius, subsequent encounter for closed fracture with routine healing: Secondary | ICD-10-CM | POA: Diagnosis not present

## 2019-10-01 DIAGNOSIS — R29898 Other symptoms and signs involving the musculoskeletal system: Secondary | ICD-10-CM | POA: Diagnosis not present

## 2019-10-01 DIAGNOSIS — M25631 Stiffness of right wrist, not elsewhere classified: Secondary | ICD-10-CM | POA: Diagnosis not present

## 2019-10-01 DIAGNOSIS — S52501D Unspecified fracture of the lower end of right radius, subsequent encounter for closed fracture with routine healing: Secondary | ICD-10-CM | POA: Diagnosis not present

## 2019-10-01 DIAGNOSIS — M25531 Pain in right wrist: Secondary | ICD-10-CM | POA: Diagnosis not present

## 2020-01-21 DIAGNOSIS — N952 Postmenopausal atrophic vaginitis: Secondary | ICD-10-CM | POA: Diagnosis not present

## 2020-01-21 DIAGNOSIS — N941 Unspecified dyspareunia: Secondary | ICD-10-CM | POA: Diagnosis not present

## 2020-02-12 ENCOUNTER — Other Ambulatory Visit: Payer: Self-pay | Admitting: General Surgery

## 2020-02-12 DIAGNOSIS — R160 Hepatomegaly, not elsewhere classified: Secondary | ICD-10-CM

## 2020-03-08 ENCOUNTER — Ambulatory Visit
Admission: RE | Admit: 2020-03-08 | Discharge: 2020-03-08 | Disposition: A | Discharge status: Home / Self Care | Payer: BC Managed Care – PPO | Source: Ambulatory Visit | Attending: General Surgery | Admitting: General Surgery

## 2020-03-08 ENCOUNTER — Other Ambulatory Visit: Payer: Self-pay | Admitting: General Surgery

## 2020-03-08 DIAGNOSIS — R160 Hepatomegaly, not elsewhere classified: Secondary | ICD-10-CM

## 2020-03-08 DIAGNOSIS — K7689 Other specified diseases of liver: Secondary | ICD-10-CM | POA: Diagnosis not present

## 2020-03-08 DIAGNOSIS — K828 Other specified diseases of gallbladder: Secondary | ICD-10-CM | POA: Diagnosis not present

## 2020-03-08 DIAGNOSIS — Z9081 Acquired absence of spleen: Secondary | ICD-10-CM | POA: Diagnosis not present

## 2020-03-08 DIAGNOSIS — Z90710 Acquired absence of both cervix and uterus: Secondary | ICD-10-CM | POA: Diagnosis not present

## 2020-03-08 MED ORDER — GADOBENATE DIMEGLUMINE 529 MG/ML IV SOLN
10.0000 mL | Freq: Once | INTRAVENOUS | Status: AC | PRN
  Administered 2020-03-08: 10 mL via INTRAVENOUS

## 2020-03-13 ENCOUNTER — Other Ambulatory Visit: Payer: Self-pay | Admitting: Neurology

## 2020-03-13 DIAGNOSIS — G43009 Migraine without aura, not intractable, without status migrainosus: Secondary | ICD-10-CM

## 2020-03-24 ENCOUNTER — Encounter: Payer: Self-pay | Admitting: Neurology

## 2020-03-24 ENCOUNTER — Other Ambulatory Visit: Payer: Self-pay

## 2020-03-24 ENCOUNTER — Ambulatory Visit (INDEPENDENT_AMBULATORY_CARE_PROVIDER_SITE_OTHER): Payer: BC Managed Care – PPO | Admitting: Neurology

## 2020-03-24 VITALS — BP 116/76 | Resp 18 | Ht 63.0 in | Wt 118.0 lb

## 2020-03-24 DIAGNOSIS — G43009 Migraine without aura, not intractable, without status migrainosus: Secondary | ICD-10-CM | POA: Diagnosis not present

## 2020-03-24 MED ORDER — NARATRIPTAN HCL 2.5 MG PO TABS: ORAL_TABLET | ORAL | 11 refills | Status: DC

## 2020-03-24 NOTE — Progress Notes (Signed)
NEUROLOGY FOLLOW UP OFFICE NOTE  Roselie Cirigliano [id number] Jun 15, 1970  HISTORY OF PRESENT ILLNESS: I had the pleasure of seeing Anaria Kroner in follow-up in the neurology clinic on 03/24/2020.  The patient was last seen a year ago for migraines. MRI brain normal. Historically she has been very sensitive to medications and had side effects on an antidepressant started for migraine prevention in the past, making her hesitant to start preventative medication. We had previously discussed Emgality, however she decided not to start it. She reports migraines have been stable over the past year. She has 2-3 migraines a week, consistently triggered by activity such as tennis or when she reads too much or when she rarely drinks alcohol. If she does not plan properly, she would have a bad migraine but it she takes Aleve quickly enough, it helps with the naratriptan. Naratriptan works the best for her. She still has the samples for Nurtec. For a time, she was having sinus issues with more headaches and had to take 1/2 tab of Naratriptan daily which did help. She describes having frontal heaviness at that time. She denies any dizziness, focal numbness/tingling/weakness. She has occasional body aches. She had an MRI of the abdomen with an incidental finding of a left T11-12 hyperintense lesion likely a hemangioma.    History on Initial Assessment 02/28/2016: This is a very pleasant 50 yo RH woman with a history of migraines since 2003, who presented for a second opinion on new approaches to her migraines. Migraines started after she gave birth to her son in 2003. They did get better after she had a hysterectomy 5 years ago, occurring every 2 weeks or so, however over the past 6 months, she has been having them more frequently, around 2-3 times a week. Migraines are over the frontal and vertex regions, sometimes behind her ears, she has throbbing pain with associated nausea if the headaches become really bad. She has really  bad 10/10 ones where medications do not help much around once a month, lasting 2-3 days. She would take prn medication but headaches return. The migraines 2-3 times a week are around 6/10, usually relieved by 1/2 tablet of naratriptan combined with Excedrin migraine No visual obscurations, she has some photo and phonophobia. She has found playing tennis to be a trigger, she plays it twice a week and consistently gets a migraine, so she would preventatively take naratriptan and Excedrin before playing. Other triggers include perfume, chocolates, wine, cheese, poor sleep, skipping meals. She has found that drinking coffee helps, she usually drinks 1 cup of coffee daily. She reports hitting her head twice this year, with the last one she had worsened headaches which have resolved. She has had right-sided tinnitus for the past year, ENT evaluation unremarkable. She had an MRI brain without contrast last 08/2015 which I personally reviewed, no acute changes seen, within normal limits. She previously took Maxalt which helped the migraine immediately, but did not last long enough, she would have migraine recurrence. She was switched to naratriptan, which does last longer, but takes a longer time to take effect. In the past she took an unrecalled antidepressant for migraine prevention, but had side effects of palpitations, which made her hesitant about preventative medications.  She denies any dizziness, diplopia, dysarthria/dysphagia, focal numbness/tingling/weakness, bowel/bladder dysfunction. She has some neck pain, at times with shooting pain in the back of her head, and feels that her migraines may be coming from her neck. She usually gets 7-8 hours of good sleep.  There is a family history of migraines in her mother and sister. Her maternal grandmother had several strokes. She has a strong family history of cancer and was positive for BRCA 2 gene mutation, and underwent bilateral mastectomy.  PAST MEDICAL  HISTORY: Past Medical History:  Diagnosis Date  . Allergy   . Anxiety    situational  . Arthritis    "clicking in the neck" on occasion   . Blood dyscrasia    thrombocytopenia-age 37, resolved, told that she is completely cured    . Blood transfusion    pt states age 33 in [place] had problems with blood clotting, no further problems  . Blood transfusion without reported diagnosis   . BRCA2 positive 09/08/2011   pt. has hx./ mother has hx. of ovarian cancer-no cancers for patient  . Bronchitis   . Clotting disorder ([place])    THROMOCYTOPENIA  AGE 41  . Depression    AFTER CHILD BIRTH  . Gallbladder polyp   . GERD (gastroesophageal reflux disease)    tums prn  . Gilbert's syndrome    "Liver doesn't process bilirubin"  . Headache(784.0)    migraines-usually once weekly, really  bad q month or two  . Heart murmur    no problems, states she has been told that it is not audible at all times   . IBS (irritable bowel syndrome)   . Osteopenia    MILD  . PONV (postoperative nausea and vomiting)   . Seasonal allergies   . 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 Voiding difficulty    history of post procedure difficulty voiding    MEDICATIONS: Current Outpatient Medications on File Prior to Visit  Medication Sig Dispense Refill  . naproxen sodium (ALEVE) 220 MG tablet Take 220 mg by mouth.    . naratriptan (AMERGE) 2.5 MG tablet TAKE 1/2 TO 1 TABLET BY MOUTH IF NEEDED FOR MIGRAINE, DO NOT TAKE MORE THAN 3 TABLETS A WEEK 10 tablet 11  . Rimegepant Sulfate (NURTEC) 75 MG TBDP Take 75 mg by mouth daily. (Patient not taking: Reported on 05/01/2019) 2 tablet 0   Current Facility-Administered Medications on File Prior to Visit  Medication Dose Route Frequency Provider Last Rate Last Admin  . 0.9 %  sodium chloride infusion  500 mL Intravenous Continuous Pyrtle, Lajuan Lines, MD        ALLERGIES: Allergies  Allergen Reactions  . Lactose Intolerance (Gi) Diarrhea, Nausea Only and  Other (See Comments)    Bloating   . Tape Dermatitis    Skin redness from the glue.    FAMILY HISTORY: Family History  Problem Relation Age of Onset  . Hypertension Mother   . Cancer Mother   . Colon cancer Neg Hx   . Esophageal cancer Neg Hx   . Pancreatic cancer Neg Hx   . Rectal cancer Neg Hx     SOCIAL HISTORY: Social History   Socioeconomic History  . Marital status: Married    Spouse name: Not on file  . Number of children: 2  . Years of education: Not on file  . Highest education level: Not on file  Occupational History  . Occupation: Artist  Tobacco Use  . Smoking status: Former Smoker    Types: Cigars    Quit date: 10/23/1993    Years since quitting: 26.4  . Smokeless tobacco: Never Used  . Tobacco comment: During college smoked cigars  Substance and Sexual Activity  . Alcohol use: Yes  Alcohol/week: 7.0 standard drinks    Types: 7 Cans of beer per week    Comment: social beer/ mixed drink, using less now  . Drug use: No  . Sexual activity: Yes  Other Topics Concern  . Not on file  Social History Narrative   Right handed    Social Determinants of Health   Financial Resource Strain: Not on file  Food Insecurity: Not on file  Transportation Needs: Not on file  Physical Activity: Not on file  Stress: Not on file  Social Connections: Not on file  Intimate Partner Violence: Not on file     PHYSICAL EXAM: Vitals:   03/24/20 1012  BP: 116/76  Resp: 18  SpO2: 97%   General: No acute distress Head:  Normocephalic/atraumatic Skin/Extremities: No rash, no edema Neurological Exam: alert and awake. No aphasia or dysarthria. Fund of knowledge is appropriate.  Attention and concentration are normal.   Cranial nerves: Pupils equal, round. Extraocular movements intact with no nystagmus. Visual fields full.  No facial asymmetry.  Motor: Bulk and tone normal, muscle strength 5/5 throughout with no pronator drift. Reflexes +2 throughout. Finger to nose  testing intact.  Gait narrow-based and steady, able to tandem walk adequately.  Romberg negative.   IMPRESSION: This is a pleasant 50 yo RH woman with a history of migraines without aura. She continues to report 2-3 migraines a week. She has been very sensitive to oral medications in the past and has been hesitant to start a different migraine preventative medication. We again discussed the CGRP inhibitors which are monthly injections, she would like to hold off for now. She has prn Naratriptan and knows to minimize rescue medication to 2-3 a week to avoid rebound headaches. She has samples for Nurtec, instructions and side effects discussed, she will let us know if helpful. Follow-up in 1 year, she knows to call for any changes.   Thank you for allowing me to participate in her care.  Please do not hesitate to call for any questions or concerns.   Ellouise Newer, M.D.   CC: Dr. Virgina Jock

## 2020-03-24 NOTE — Patient Instructions (Signed)
Always good to see you! Continue all your medications. Let me know if you would like start the monthly injections to help with migraine prevention. Follow-up with your eye doctor. Follow-up in 1 year, call for any changes.

## 2020-05-19 ENCOUNTER — Other Ambulatory Visit: Payer: Self-pay | Admitting: Neurology

## 2020-05-19 DIAGNOSIS — G43009 Migraine without aura, not intractable, without status migrainosus: Secondary | ICD-10-CM

## 2020-06-01 DIAGNOSIS — L298 Other pruritus: Secondary | ICD-10-CM | POA: Diagnosis not present

## 2020-06-01 DIAGNOSIS — L308 Other specified dermatitis: Secondary | ICD-10-CM | POA: Diagnosis not present

## 2020-07-20 DIAGNOSIS — D649 Anemia, unspecified: Secondary | ICD-10-CM | POA: Diagnosis not present

## 2020-07-20 DIAGNOSIS — E559 Vitamin D deficiency, unspecified: Secondary | ICD-10-CM | POA: Diagnosis not present

## 2020-07-22 DIAGNOSIS — R82998 Other abnormal findings in urine: Secondary | ICD-10-CM | POA: Diagnosis not present

## 2020-07-22 DIAGNOSIS — Z0000 Encounter for general adult medical examination without abnormal findings: Secondary | ICD-10-CM | POA: Diagnosis not present

## 2020-07-22 DIAGNOSIS — D649 Anemia, unspecified: Secondary | ICD-10-CM | POA: Diagnosis not present

## 2020-07-22 DIAGNOSIS — Z1212 Encounter for screening for malignant neoplasm of rectum: Secondary | ICD-10-CM | POA: Diagnosis not present

## 2020-08-20 DIAGNOSIS — M810 Age-related osteoporosis without current pathological fracture: Secondary | ICD-10-CM | POA: Diagnosis not present

## 2020-10-23 DIAGNOSIS — Z23 Encounter for immunization: Secondary | ICD-10-CM | POA: Diagnosis not present

## 2021-03-16 DIAGNOSIS — D2372 Other benign neoplasm of skin of left lower limb, including hip: Secondary | ICD-10-CM | POA: Diagnosis not present

## 2021-03-16 DIAGNOSIS — D1801 Hemangioma of skin and subcutaneous tissue: Secondary | ICD-10-CM | POA: Diagnosis not present

## 2021-03-16 DIAGNOSIS — D2262 Melanocytic nevi of left upper limb, including shoulder: Secondary | ICD-10-CM | POA: Diagnosis not present

## 2021-03-16 DIAGNOSIS — L821 Other seborrheic keratosis: Secondary | ICD-10-CM | POA: Diagnosis not present

## 2021-03-24 ENCOUNTER — Ambulatory Visit: Payer: BC Managed Care – PPO | Admitting: Neurology

## 2021-04-15 ENCOUNTER — Ambulatory Visit (INDEPENDENT_AMBULATORY_CARE_PROVIDER_SITE_OTHER): Payer: BC Managed Care – PPO | Admitting: Neurology

## 2021-04-15 ENCOUNTER — Encounter: Payer: Self-pay | Admitting: Neurology

## 2021-04-15 VITALS — BP 113/76 | HR 64 | Ht 63.0 in | Wt 122.4 lb

## 2021-04-15 DIAGNOSIS — G43009 Migraine without aura, not intractable, without status migrainosus: Secondary | ICD-10-CM | POA: Diagnosis not present

## 2021-04-15 MED ORDER — NARATRIPTAN HCL 2.5 MG PO TABS: ORAL_TABLET | ORAL | 11 refills | Status: DC

## 2021-04-15 NOTE — Progress Notes (Signed)
? ?NEUROLOGY FOLLOW UP OFFICE NOTE ? ?[name] ?[id number] ?[birth year] ? ?HISTORY OF PRESENT ILLNESS: ?I had the pleasure of seeing Dawn Boyd in follow-up in the neurology clinic on 04/15/2021.  The patient was last seen a year ago for migraines. MRI brain normal. Historically she has been very sensitive to medications and had side effects on an antidepressant started for migraine prevention in the past, making her hesitant to start preventative medication. She continues to report 2-3 migraines a week, consistently triggered by activities such as tennis, or when she rarely drinks alcohol. She has prn naratriptan for rescue. She usually takes naproxen before she plays tennis, she cuts them in half and takes 3 tablets through the week. She has been trying to cut this down. When she catches a migraine early, she takes 1/2-1 tab of naratriptan with good effect. She states nothing has changed with her migraines, she still has 2-3 a week, worse with weather changes and certain food triggers and strong scents. Sometimes she gets a little nausea/queasy with mild dizziness with bad migraines. No focal numbness/tingling/weakness. No vision changes, she is always sensitive to light and is trying to experiment with different lenses. Sleep is good. No falls.  ? ? ?History on Initial Assessment 02/28/2016: This is a very pleasant 51 yo RH woman with a history of migraines since 2003, who presented for a second opinion on new approaches to her migraines. Migraines started after she gave birth to her son in 2003. They did get better after she had a hysterectomy 5 years ago, occurring every 2 weeks or so, however over the past 6 months, she has been having them more frequently, around 2-3 times a week. Migraines are over the frontal and vertex regions, sometimes behind her ears, she has throbbing pain with associated nausea if the headaches become really bad. She has really bad 10/10 ones where medications do not help much  around once a month, lasting 2-3 days. She would take prn medication but headaches return. The migraines 2-3 times a week are around 6/10, usually relieved by 1/2 tablet of naratriptan combined with Excedrin migraine No visual obscurations, she has some photo and phonophobia. She has found playing tennis to be a trigger, she plays it twice a week and consistently gets a migraine, so she would preventatively take naratriptan and Excedrin before playing. Other triggers include perfume, chocolates, wine, cheese, poor sleep, skipping meals. She has found that drinking coffee helps, she usually drinks 1 cup of coffee daily. She reports hitting her head twice this year, with the last one she had worsened headaches which have resolved. She has had right-sided tinnitus for the past year, ENT evaluation unremarkable. She had an MRI brain without contrast last 08/2015 which I personally reviewed, no acute changes seen, within normal limits. She previously took Maxalt which helped the migraine immediately, but did not last long enough, she would have migraine recurrence. She was switched to naratriptan, which does last longer, but takes a longer time to take effect. In the past she took an unrecalled antidepressant for migraine prevention, but had side effects of palpitations, which made her hesitant about preventative medications. ?  ?She denies any dizziness, diplopia, dysarthria/dysphagia, focal numbness/tingling/weakness, bowel/bladder dysfunction. She has some neck pain, at times with shooting pain in the back of her head, and feels that her migraines may be coming from her neck. She usually gets 7-8 hours of good sleep. There is a family history of migraines in her mother and sister. Her  maternal grandmother had several strokes. She has a strong family history of cancer and was positive for BRCA 2 gene mutation, and underwent bilateral mastectomy.  ? ?PAST MEDICAL HISTORY: ?Past Medical History:  ?Diagnosis Date  ?  Allergy   ? Anxiety   ? situational  ? Arthritis   ? "clicking in the neck" on occasion   ? Blood dyscrasia   ? thrombocytopenia-age 51, resolved, told that she is completely cured    ? Blood transfusion   ? pt states age 51 in [place] had problems with blood clotting, no further problems  ? Blood transfusion without reported diagnosis   ? BRCA2 positive 09/08/2011  ? pt. has hx./ mother has hx. of ovarian cancer-no cancers for patient  ? Bronchitis   ? Clotting disorder ([place])   ? THROMOCYTOPENIA  AGE 51  ? Depression   ? AFTER CHILD BIRTH  ? Gallbladder polyp   ? GERD (gastroesophageal reflux disease)   ? tums prn  ? Gilbert's syndrome   ? "Liver doesn't process bilirubin"  ? Headache(784.0)   ? migraines-usually once weekly, really  bad q month or two  ? Heart murmur   ? no problems, states she has been told that it is not audible at all times   ? IBS (irritable bowel syndrome)   ? Osteopenia   ? MILD  ? PONV (postoperative nausea and vomiting)   ? Seasonal allergies   ? Thrombosis of splenic artery anastomosis (HCC)   ? Voiding difficulty   ? history of post procedure difficulty voiding  ? ? ?MEDICATIONS: ?Current Outpatient Medications on File Prior to Visit  ?Medication Sig Dispense Refill  ? naproxen sodium (ALEVE) 220 MG tablet Take 220 mg by mouth.    ? naratriptan (AMERGE) 2.5 MG tablet TAKE 1/2 TO 1 TABLET BY MOUTH IF NEEDED FOR MIGRAINE, DO NOT TAKE MORE THAN 3 TABLETS A WEEK 10 tablet 11  ? Rimegepant Sulfate (NURTEC) 75 MG TBDP Take 75 mg by mouth daily. (Patient not taking: No sig reported) 2 tablet 0  ? ?Current Facility-Administered Medications on File Prior to Visit  ?Medication Dose Route Frequency Provider Last Rate Last Admin  ? 0.9 %  sodium chloride infusion  500 mL Intravenous Continuous Pyrtle, Lajuan Lines, MD      ? ? ?ALLERGIES: ?Allergies  ?Allergen Reactions  ? Lactose Intolerance (Gi) Diarrhea, Nausea Only and Other (See Comments)  ?  Bloating ?  ? Tape Dermatitis  ?  Skin redness from the glue.   ? ? ?FAMILY HISTORY: ?Family History  ?Problem Relation Age of Onset  ? Hypertension Mother   ? Cancer Mother   ? Colon cancer Neg Hx   ? Esophageal cancer Neg Hx   ? Pancreatic cancer Neg Hx   ? Rectal cancer Neg Hx   ? ? ?SOCIAL HISTORY: ?Social History  ? ?Socioeconomic History  ? Marital status: Married  ?  Spouse name: Not on file  ? Number of children: 2  ? Years of education: Not on file  ? Highest education level: Not on file  ?Occupational History  ? Occupation: Artist  ?Tobacco Use  ? Smoking status: Former  ?  Types: Cigars  ?  Quit date: 10/23/1993  ?  Years since quitting: 27.4  ? Smokeless tobacco: Never  ? Tobacco comments:  ?  During college smoked cigars  ?Substance and Sexual Activity  ? Alcohol use: Yes  ?  Alcohol/week: 7.0 standard drinks  ?  Types: 7 Cans  of beer per week  ?  Comment: social beer/ mixed drink, using less now  ? Drug use: No  ? Sexual activity: Yes  ?Other Topics Concern  ? Not on file  ?Social History Narrative  ? Right handed   ? Drinks caffeine  ? 3 story home  ? ?Social Determinants of Health  ? ?Financial Resource Strain: Not on file  ?Food Insecurity: Not on file  ?Transportation Needs: Not on file  ?Physical Activity: Not on file  ?Stress: Not on file  ?Social Connections: Not on file  ?Intimate Partner Violence: Not on file  ? ? ? ?PHYSICAL EXAM: ?Vitals:  ? 04/15/21 1405  ?BP: 113/76  ?Pulse: 64  ?SpO2: 97%  ? ?General: No acute distress ?Head:  Normocephalic/atraumatic ?Skin/Extremities: No rash, no edema ?Neurological Exam: alert and awake. No aphasia or dysarthria. Fund of knowledge is appropriate.  Attention and concentration are normal.   Cranial nerves: Pupils equal, round. Extraocular movements intact with no nystagmus. Visual fields full.  No facial asymmetry.  Motor: Bulk and tone normal, muscle strength 5/5 throughout with no pronator drift.   Finger to nose testing intact.  Gait narrow-based and steady, able to tandem walk adequately.  Romberg  negative. ? ? ?IMPRESSION: ?This is a pleasant 51 yo RH woman with a history of migraines without aura. She continues to report 2-3 migraines a week, usually triggered by activity. She She has been very sensitive to oral Runner, broadcasting/film/video

## 2021-04-15 NOTE — Patient Instructions (Signed)
Always good to see you. Refills sent for Naratriptan. Minimize over the counter pain medication to 2-3 a week to avoid rebound headaches. Options for monthly injections include Aimovig, Emgality, and Ajovy. Let me know if you would like to proceed with one of them. Follow-up in 1 year, call for any changes. ?

## 2021-04-26 ENCOUNTER — Other Ambulatory Visit: Payer: Self-pay | Admitting: Neurology

## 2021-04-26 DIAGNOSIS — G43009 Migraine without aura, not intractable, without status migrainosus: Secondary | ICD-10-CM

## 2021-08-04 DIAGNOSIS — R7989 Other specified abnormal findings of blood chemistry: Secondary | ICD-10-CM | POA: Diagnosis not present

## 2021-08-04 DIAGNOSIS — E559 Vitamin D deficiency, unspecified: Secondary | ICD-10-CM | POA: Diagnosis not present

## 2021-08-15 DIAGNOSIS — M810 Age-related osteoporosis without current pathological fracture: Secondary | ICD-10-CM | POA: Diagnosis not present

## 2021-08-15 DIAGNOSIS — Z0000 Encounter for general adult medical examination without abnormal findings: Secondary | ICD-10-CM | POA: Diagnosis not present

## 2021-08-15 DIAGNOSIS — R82998 Other abnormal findings in urine: Secondary | ICD-10-CM | POA: Diagnosis not present

## 2021-09-07 ENCOUNTER — Telehealth: Payer: Self-pay | Admitting: Neurology

## 2021-09-07 NOTE — Telephone Encounter (Signed)
Caller states she wants to see if Dr. Delice Lesch can adjust her Rx for 9 pills at a time instead of 2 like she used to get.  [facility] at [place]

## 2021-09-08 NOTE — Telephone Encounter (Signed)
No answer, will send mychart message

## 2021-09-08 NOTE — Telephone Encounter (Signed)
Pls check with her if this is the naratriptan? The Rx I sent in March was for 10 tablets every month, can she check with pharmacy if this is her insurance giving the problem? Thanks

## 2021-10-19 ENCOUNTER — Telehealth: Payer: Self-pay | Admitting: Anesthesiology

## 2021-10-19 NOTE — Telephone Encounter (Signed)
Pt called no answer left a voice mail she has refills at the pharmacy and they are feeling her medication for her,

## 2021-10-19 NOTE — Telephone Encounter (Signed)
Patient left message requesting a call back regarding her medication Naratriptan. Patient said she was supposed to have a refill sent to [facility] on [address]. When she went to pick it up it was not there.

## 2021-10-29 DIAGNOSIS — Z23 Encounter for immunization: Secondary | ICD-10-CM | POA: Diagnosis not present

## 2022-02-01 ENCOUNTER — Encounter: Payer: Self-pay | Admitting: Neurology

## 2022-03-31 DIAGNOSIS — Z0000 Encounter for general adult medical examination without abnormal findings: Secondary | ICD-10-CM | POA: Diagnosis not present

## 2022-04-05 ENCOUNTER — Encounter: Payer: Self-pay | Admitting: Neurology

## 2022-04-05 ENCOUNTER — Ambulatory Visit (INDEPENDENT_AMBULATORY_CARE_PROVIDER_SITE_OTHER): Payer: BC Managed Care – PPO | Admitting: Neurology

## 2022-04-05 VITALS — BP 103/66 | HR 68 | Ht 63.0 in | Wt 123.4 lb

## 2022-04-05 DIAGNOSIS — G43009 Migraine without aura, not intractable, without status migrainosus: Secondary | ICD-10-CM | POA: Diagnosis not present

## 2022-04-05 MED ORDER — NARATRIPTAN HCL 2.5 MG PO TABS: ORAL_TABLET | ORAL | 11 refills | Status: DC

## 2022-04-05 NOTE — Patient Instructions (Signed)
Always good to see you. Refills sent for the naratriptan. You can try magnesium oxide 400mg  daily and Riboflavin (vitamin B2) 400mg  daily. Follow-up in 1 year, call for any changes.

## 2022-04-05 NOTE — Progress Notes (Signed)
NEUROLOGY FOLLOW UP OFFICE NOTE  Dawn Boyd JK:[id number] 15-Sep-1970  HISTORY OF PRESENT ILLNESS: I had the pleasure of seeing Dawn Boyd in follow-up in the neurology clinic on 04/05/2022.  The patient was last seen a year ago for migraines. MRI brain normal. Historically she has been very sensitive to medications and had side effects on an antidepressant started for migraine prevention in the past, making her hesitant to start preventative medication. Since her last visit, she reports migraines have been stable, maybe a little better. She was previously reporting 2-3 migraines a week, she states she may go 10 days without a migraine, but then would have a migraine daily for 1 week. The prn naratriptan still works, she takes 1/2 to 1 tablet as needed with no side effects. She has noticed consistently that changes in barometric pressure trigger the bad migraines where nothing helps. Being active and exhaustion, as well as when she has neck pain/cracking/sleeps wrong, can also be triggers. When she started taking a multivitamin, it seemed to help reduce migraines some. No dizziness except when she has ear pain or congestion. No vision changes, focal numbness/tingling/weakness, no falls. She gets 6-7 hours of sleep.    History on Initial Assessment 02/28/2016: This is a very pleasant 52 yo RH woman with a history of migraines since 2003, who presented for a second opinion on new approaches to her migraines. Migraines started after she gave birth to her son in 2003. They did get better after she had a hysterectomy 5 years ago, occurring every 2 weeks or so, however over the past 6 months, she has been having them more frequently, around 2-3 times a week. Migraines are over the frontal and vertex regions, sometimes behind her ears, she has throbbing pain with associated nausea if the headaches become really bad. She has really bad 10/10 ones where medications do not help much around once a month, lasting 2-3  days. She would take prn medication but headaches return. The migraines 2-3 times a week are around 6/10, usually relieved by 1/2 tablet of naratriptan combined with Excedrin migraine No visual obscurations, she has some photo and phonophobia. She has found playing tennis to be a trigger, she plays it twice a week and consistently gets a migraine, so she would preventatively take naratriptan and Excedrin before playing. Other triggers include perfume, chocolates, wine, cheese, poor sleep, skipping meals. She has found that drinking coffee helps, she usually drinks 1 cup of coffee daily. She reports hitting her head twice this year, with the last one she had worsened headaches which have resolved. She has had right-sided tinnitus for the past year, ENT evaluation unremarkable. She had an MRI brain without contrast last 08/2015 which I personally reviewed, no acute changes seen, within normal limits. She previously took Maxalt which helped the migraine immediately, but did not last long enough, she would have migraine recurrence. She was switched to naratriptan, which does last longer, but takes a longer time to take effect. In the past she took an unrecalled antidepressant for migraine prevention, but had side effects of palpitations, which made her hesitant about preventative medications.   She denies any dizziness, diplopia, dysarthria/dysphagia, focal numbness/tingling/weakness, bowel/bladder dysfunction. She has some neck pain, at times with shooting pain in the back of her head, and feels that her migraines may be coming from her neck. She usually gets 7-8 hours of good sleep. There is a family history of migraines in her mother and sister. Her maternal grandmother had several  strokes. She has a strong family history of cancer and was positive for BRCA 2 gene mutation, and underwent bilateral mastectomy.   PAST MEDICAL HISTORY: Past Medical History:  Diagnosis Date   Allergy    Anxiety    situational    Arthritis    "clicking in the neck" on occasion    Blood dyscrasia    thrombocytopenia-age 49, resolved, told that she is completely cured     Blood transfusion    pt states age 31 in [place] had problems with blood clotting, no further problems   Blood transfusion without reported diagnosis    BRCA2 positive 09/08/2011   pt. has hx./ mother has hx. of ovarian cancer-no cancers for patient   Bronchitis    Clotting disorder ([place])    THROMOCYTOPENIA  AGE 63   Depression    AFTER CHILD BIRTH   Gallbladder polyp    GERD (gastroesophageal reflux disease)    tums prn   Gilbert's syndrome    "Liver doesn't process bilirubin"   Headache(784.0)    migraines-usually once weekly, really  bad q month or two   Heart murmur    no problems, states she has been told that it is not audible at all times    IBS (irritable bowel syndrome)    Osteopenia    MILD   PONV (postoperative nausea and vomiting)    Seasonal allergies    Thrombosis of splenic artery anastomosis (HCC)    Voiding difficulty    history of post procedure difficulty voiding    MEDICATIONS: Current Outpatient Medications on File Prior to Visit  Medication Sig Dispense Refill   Multiple Vitamin (MULTIVITAMIN) tablet Take 1 tablet by mouth daily.     naratriptan (AMERGE) 2.5 MG tablet TAKE 1/2 TO 1 TABLET BY MOUTH IF NEEDED FOR MIGRAINE, DO NOT TAKE MORE THAN 3 TABLETS A WEEK 10 tablet 11   naproxen sodium (ALEVE) 220 MG tablet Take 220 mg by mouth. (Patient not taking: Reported on 04/05/2022)     Current Facility-Administered Medications on File Prior to Visit  Medication Dose Route Frequency Provider Last Rate Last Admin   0.9 %  sodium chloride infusion  500 mL Intravenous Continuous Pyrtle, Lajuan Lines, MD        ALLERGIES: Allergies  Allergen Reactions   Lactose Intolerance (Gi) Diarrhea, Nausea Only and Other (See Comments)    Bloating    Tape Dermatitis    Skin redness from the glue.    FAMILY HISTORY: Family History   Problem Relation Age of Onset   Hypertension Mother    Cancer Mother    Colon cancer Neg Hx    Esophageal cancer Neg Hx    Pancreatic cancer Neg Hx    Rectal cancer Neg Hx     SOCIAL HISTORY: Social History   Socioeconomic History   Marital status: Married    Spouse name: Not on file   Number of children: 2   Years of education: Not on file   Highest education level: Not on file  Occupational History   Occupation: Artist  Tobacco Use   Smoking status: Former    Types: Cigars    Quit date: 10/23/1993    Years since quitting: 28.4   Smokeless tobacco: Never   Tobacco comments:    During college smoked cigars  Vaping Use   Vaping Use: Never used  Substance and Sexual Activity   Alcohol use: Yes    Alcohol/week: 7.0 standard drinks of alcohol  Types: 7 Cans of beer per week    Comment: social beer/ mixed drink, using less now   Drug use: No   Sexual activity: Yes  Other Topics Concern   Not on file  Social History Narrative   Right handed    Drinks caffeine   3 story home   Social Determinants of Health   Financial Resource Strain: Not on file  Food Insecurity: Not on file  Transportation Needs: Not on file  Physical Activity: Not on file  Stress: Not on file  Social Connections: Not on file  Intimate Partner Violence: Not on file     PHYSICAL EXAM: Vitals:   04/05/22 1401  BP: 103/66  Pulse: 68  SpO2: 100%   General: No acute distress Head:  Normocephalic/atraumatic Skin/Extremities: No rash, no edema Neurological Exam: alert and awake. No aphasia or dysarthria. Fund of knowledge is appropriate. Attention and concentration are normal.   Cranial nerves: Pupils equal, round. Extraocular movements intact with no nystagmus. Visual fields full.  No facial asymmetry.  Motor: Bulk and tone normal, muscle strength 5/5 throughout with no pronator drift.   Finger to nose testing intact.  Gait narrow-based and steady, able to tandem walk adequately.  Romberg  negative.   IMPRESSION: This is a pleasant 52 yo RH woman with a history of migraines without aura. She reports migraines are pretty stable on prn naratriptan, refills sent. Side effects and concerns today discussed. She has been very sensitive to oral medications in the past and has been hesitant to start a different migraine preventative medication. We discussed that there are newer options each year, she will think about it. Follow-up in 1 year, call for any changes.   Thank you for allowing me to participate in her care.  Please do not hesitate to call for any questions or concerns.    Ellouise Newer, M.D.   CC: Dr. Virgina Jock

## 2022-04-17 ENCOUNTER — Ambulatory Visit: Payer: BC Managed Care – PPO | Admitting: Neurology

## 2022-05-10 DIAGNOSIS — L820 Inflamed seborrheic keratosis: Secondary | ICD-10-CM | POA: Diagnosis not present

## 2022-05-10 DIAGNOSIS — D2262 Melanocytic nevi of left upper limb, including shoulder: Secondary | ICD-10-CM | POA: Diagnosis not present

## 2022-05-10 DIAGNOSIS — L821 Other seborrheic keratosis: Secondary | ICD-10-CM | POA: Diagnosis not present

## 2022-05-10 DIAGNOSIS — D2272 Melanocytic nevi of left lower limb, including hip: Secondary | ICD-10-CM | POA: Diagnosis not present

## 2022-05-10 DIAGNOSIS — D225 Melanocytic nevi of trunk: Secondary | ICD-10-CM | POA: Diagnosis not present

## 2022-05-10 DIAGNOSIS — D485 Neoplasm of uncertain behavior of skin: Secondary | ICD-10-CM | POA: Diagnosis not present

## 2022-08-31 DIAGNOSIS — R7989 Other specified abnormal findings of blood chemistry: Secondary | ICD-10-CM | POA: Diagnosis not present

## 2022-08-31 DIAGNOSIS — E559 Vitamin D deficiency, unspecified: Secondary | ICD-10-CM | POA: Diagnosis not present

## 2022-08-31 DIAGNOSIS — D649 Anemia, unspecified: Secondary | ICD-10-CM | POA: Diagnosis not present

## 2022-09-05 ENCOUNTER — Telehealth: Payer: Self-pay | Admitting: Internal Medicine

## 2022-09-05 NOTE — Telephone Encounter (Signed)
I presume she means she does not have an MSH2 gene mutation? If she were to have MSH2 gene mutation this would have screening implications JMP

## 2022-09-05 NOTE — Telephone Encounter (Signed)
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

## 2022-09-06 NOTE — Telephone Encounter (Signed)
Left message for pt to call back  °

## 2022-09-07 DIAGNOSIS — G43909 Migraine, unspecified, not intractable, without status migrainosus: Secondary | ICD-10-CM | POA: Diagnosis not present

## 2022-09-07 DIAGNOSIS — Z1331 Encounter for screening for depression: Secondary | ICD-10-CM | POA: Diagnosis not present

## 2022-09-07 DIAGNOSIS — Z0000 Encounter for general adult medical examination without abnormal findings: Secondary | ICD-10-CM | POA: Diagnosis not present

## 2022-09-07 DIAGNOSIS — R82998 Other abnormal findings in urine: Secondary | ICD-10-CM | POA: Diagnosis not present

## 2022-09-11 NOTE — Telephone Encounter (Signed)
Left message for pt to call back  °

## 2022-09-15 NOTE — Telephone Encounter (Signed)
Pt has not returned the call. Will await further communication from pt.

## 2022-10-21 DIAGNOSIS — Z23 Encounter for immunization: Secondary | ICD-10-CM | POA: Diagnosis not present

## 2023-02-19 ENCOUNTER — Encounter (HOSPITAL_BASED_OUTPATIENT_CLINIC_OR_DEPARTMENT_OTHER): Payer: Self-pay | Admitting: Emergency Medicine

## 2023-02-19 ENCOUNTER — Emergency Department (HOSPITAL_BASED_OUTPATIENT_CLINIC_OR_DEPARTMENT_OTHER): Payer: BC Managed Care – PPO

## 2023-02-19 ENCOUNTER — Emergency Department (HOSPITAL_BASED_OUTPATIENT_CLINIC_OR_DEPARTMENT_OTHER)
Admission: EM | Admit: 2023-02-19 | Discharge: 2023-02-19 | Disposition: A | Discharge status: Home / Self Care | Payer: BC Managed Care – PPO

## 2023-02-19 ENCOUNTER — Other Ambulatory Visit: Payer: Self-pay

## 2023-02-19 DIAGNOSIS — T7840XA Allergy, unspecified, initial encounter: Secondary | ICD-10-CM | POA: Insufficient documentation

## 2023-02-19 DIAGNOSIS — R079 Chest pain, unspecified: Secondary | ICD-10-CM | POA: Diagnosis not present

## 2023-02-19 LAB — CBC
HCT: 37.2 % (ref 36.0–46.0)
Hemoglobin: 12.4 g/dL (ref 12.0–15.0)
MCH: 30 pg (ref 26.0–34.0)
MCHC: 33.3 g/dL (ref 30.0–36.0)
MCV: 90.1 fL (ref 80.0–100.0)
Platelets: 368 10*3/uL (ref 150–400)
RBC: 4.13 MIL/uL (ref 3.87–5.11)
RDW: 12.7 % (ref 11.5–15.5)
WBC: 7.1 10*3/uL (ref 4.0–10.5)
nRBC: 0 % (ref 0.0–0.2)

## 2023-02-19 LAB — COMPREHENSIVE METABOLIC PANEL WITH GFR
ALT: 24 U/L (ref 0–44)
AST: 24 U/L (ref 15–41)
Albumin: 4.3 g/dL (ref 3.5–5.0)
Alkaline Phosphatase: 68 U/L (ref 38–126)
Anion gap: 9 (ref 5–15)
BUN: 15 mg/dL (ref 6–20)
CO2: 27 mmol/L (ref 22–32)
Calcium: 9.9 mg/dL (ref 8.9–10.3)
Chloride: 103 mmol/L (ref 98–111)
Creatinine, Ser: 0.78 mg/dL (ref 0.44–1.00)
GFR, Estimated: >60 mL/min
Glucose, Bld: 97 mg/dL (ref 70–99)
Potassium: 4 mmol/L (ref 3.5–5.1)
Sodium: 139 mmol/L (ref 135–145)
Total Bilirubin: 1 mg/dL (ref 0.0–1.2)
Total Protein: 7.5 g/dL (ref 6.5–8.1)

## 2023-02-19 LAB — TROPONIN I (HIGH SENSITIVITY): Troponin I (High Sensitivity): <2 ng/L (ref <18)

## 2023-02-19 LAB — RESP PANEL BY RT-PCR (RSV, FLU A&B, COVID)  RVPGX2
Influenza A by PCR: NEGATIVE
Influenza B by PCR: NEGATIVE
Resp Syncytial Virus by PCR: NEGATIVE
SARS Coronavirus 2 by RT PCR: NEGATIVE

## 2023-02-19 MED ORDER — DEXAMETHASONE 4 MG PO TABS
8.0000 mg | ORAL_TABLET | Freq: Once | ORAL | Status: AC
  Administered 2023-02-19: 8 mg via ORAL
  Filled 2023-02-19: qty 2

## 2023-02-19 MED ORDER — DIPHENHYDRAMINE HCL 25 MG PO CAPS
50.0000 mg | ORAL_CAPSULE | Freq: Once | ORAL | Status: AC
  Administered 2023-02-19: 50 mg via ORAL
  Filled 2023-02-19: qty 2

## 2023-02-19 MED ORDER — ONDANSETRON 4 MG PO TBDP
4.0000 mg | ORAL_TABLET | Freq: Once | ORAL | Status: AC
  Administered 2023-02-19: 4 mg via ORAL
  Filled 2023-02-19: qty 1

## 2023-02-19 NOTE — Discharge Instructions (Addendum)
Please take Benadryl tonight before bed.  Please follow-up with your primary doctor.  Return immediately felt fevers, chills, chest pain, shortness of breath, tongue or lip swelling, uncontrolled nausea vomiting or you develop any new or worsening symptoms that are concerning to you.

## 2023-02-19 NOTE — ED Provider Notes (Signed)
[place] EMERGENCY DEPARTMENT AT [facility] Provider Note   CSN: [id number] Arrival date & time: 02/19/23  4098     History  Chief Complaint  Patient presents with   Allergic Reaction    Dawn Boyd is a 53 y.o. female.  This is a 53 year old female present emergency department for possible allergic reaction.  She reports she woke this morning with itching and hives to her arms and into her torso.  Reports that she typically has itchy skin, but today more so and noticed these splotchy areas.  Started new lotion a week ago.  Also complaining of epigastric discomfort and chest pressure.  Has had several loose bowel movements today.   Allergic Reaction      Home Medications Prior to Admission medications   Medication Sig Start Date End Date Taking? Authorizing Provider  Multiple Vitamin (MULTIVITAMIN) tablet Take 1 tablet by mouth daily.    [provider]  naproxen sodium (ALEVE) 220 MG tablet Take 220 mg by mouth. Patient not taking: Reported on 04/05/2022    [provider]  naratriptan (AMERGE) 2.5 MG tablet TAKE 1/2 TO 1 TABLET BY MOUTH IF NEEDED FOR MIGRAINE, DO NOT TAKE MORE THAN 3 TABLETS A WEEK 04/05/22   Van Clines, MD      Allergies    Lactose intolerance (gi) and Tape    Review of Systems   Review of Systems  Physical Exam Updated Vital Signs BP (!) 161/84   Pulse 63   Temp 97.9 F (36.6 C)   Resp 19   Wt 54.4 kg   LMP 10/31/2010   SpO2 100%   BMI 21.26 kg/m  Physical Exam Vitals and nursing note reviewed.  Constitutional:      General: She is not in acute distress.    Appearance: She is not toxic-appearing.  HENT:     Head: Normocephalic.     Nose: Nose normal.     Mouth/Throat:     Mouth: Mucous membranes are moist.  Eyes:     Conjunctiva/sclera: Conjunctivae normal.  Cardiovascular:     Rate and Rhythm: Normal rate and regular rhythm.  Pulmonary:     Effort: Pulmonary effort is normal.  Abdominal:      General: Abdomen is flat. There is no distension.     Tenderness: There is no abdominal tenderness. There is no guarding or rebound.  Musculoskeletal:        General: Normal range of motion.  Skin:    General: Skin is warm.     Capillary Refill: Capillary refill takes less than 2 seconds.  Neurological:     Mental Status: She is alert and oriented to person, place, and time.  Psychiatric:        Mood and Affect: Mood normal.        Behavior: Behavior normal.     ED Results / Procedures / Treatments   Labs (all labs ordered are listed, but only abnormal results are displayed) Labs Reviewed  RESP PANEL BY RT-PCR (RSV, FLU A&B, COVID)  [id number]  CBC  COMPREHENSIVE METABOLIC PANEL  TROPONIN I (HIGH SENSITIVITY)  TROPONIN I (HIGH SENSITIVITY)    EKG EKG Interpretation Date/Time:  Monday February 19 2023 11:07:29 EST Ventricular Rate:  60 PR Interval:  155 QRS Duration:  99 QT Interval:  419 QTC Calculation: 419 R Axis:   71  Text Interpretation: Sinus rhythm Probable left atrial enlargement RSR' in V1 or V2, probably normal variant Left ventricular hypertrophy Confirmed by  Estanislado Pandy [phone]) on 02/19/2023 11:22:45 AM  Radiology DG Chest Portable 1 View Result Date: 02/19/2023 CLINICAL DATA:  Chest pain EXAM: PORTABLE CHEST 1 VIEW COMPARISON:  None Available. FINDINGS: Lungs are hyperexpanded. The lungs are clear without focal pneumonia, edema, pneumothorax or pleural effusion. The cardiopericardial silhouette is within normal limits for size. No acute bony abnormality. IMPRESSION: No active disease. Electronically Signed   By: Kennith Center M.D.   On: 02/19/2023 11:15    Procedures Procedures    Medications Ordered in ED Medications  dexamethasone (DECADRON) tablet 8 mg (8 mg Oral Given 02/19/23 1051)  diphenhydrAMINE (BENADRYL) capsule 50 mg (50 mg Oral Given 02/19/23 1050)  ondansetron (ZOFRAN-ODT) disintegrating tablet 4 mg (4 mg Oral Given 02/19/23 1051)    ED Course/  Medical Decision Making/ A&P                                 Medical Decision Making This is a 53 year old female present emergency department for possible allergic reaction.  She is afebrile nontachycardic hemodynamically stable.  On exam no signs of angioedema.  Lungs are clear.  Does have some hives to the antecubital fossa, but not very impressive.  Was complaining of epigastric discomfort.  Benign abdominal exam.  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  Concern for possible allergic reaction versus hepatic dysfunction versus atypical chest pain/ACS.  Basic labs obtained.  No leukocytosis to suggest systemic infection.  Comprehensive panel with no metabolic derangements.  No transaminitis or elevated bilirubin to suggest hepatobiliary disease.  EKG no ST segment changes to indicate ischemia.  Troponin negative.  ACS less likely.  Given constellation of symptoms possible flu/COVID/RSV, however negative.  Chest x-ray without pneumonia or pneumothorax.  Patient was given Decadron and Benadryl here in case there was an allergic component.  She is feeling improved after medications.  Observed and no worsening of symptoms.  Tolerating p.o. here in the emergency department.  Stable for discharge at this time.  Discussed follow-up with primary doctor.  Amount and/or Complexity of Data Reviewed Labs: ordered. Radiology: ordered. ECG/medicine tests: ordered.  Risk Prescription drug management.         Final Clinical Impression(s) / ED Diagnoses Final diagnoses:  None    Rx / DC Orders ED Discharge Orders     None         Coral Spikes, DO 02/19/23 1228

## 2023-02-19 NOTE — ED Notes (Signed)
Rash and hives are less red and itchy. Reports no pain with swallowing or in her chest. Swallows without difficulty. No SOB.

## 2023-02-19 NOTE — ED Triage Notes (Signed)
Pt reports itching and hives today. Hives noted to lower abd and on back and extremities  Denies change in meds. No s/s of resp distress. Endorses abd pain today

## 2023-02-20 ENCOUNTER — Emergency Department (HOSPITAL_BASED_OUTPATIENT_CLINIC_OR_DEPARTMENT_OTHER)
Admission: EM | Admit: 2023-02-20 | Discharge: 2023-02-20 | Disposition: A | Discharge status: Home / Self Care | Payer: BC Managed Care – PPO | Attending: Emergency Medicine | Admitting: Emergency Medicine

## 2023-02-20 ENCOUNTER — Encounter (HOSPITAL_BASED_OUTPATIENT_CLINIC_OR_DEPARTMENT_OTHER): Payer: Self-pay

## 2023-02-20 ENCOUNTER — Other Ambulatory Visit: Payer: Self-pay

## 2023-02-20 DIAGNOSIS — L501 Idiopathic urticaria: Secondary | ICD-10-CM | POA: Diagnosis not present

## 2023-02-20 DIAGNOSIS — R1013 Epigastric pain: Secondary | ICD-10-CM | POA: Insufficient documentation

## 2023-02-20 DIAGNOSIS — L509 Urticaria, unspecified: Secondary | ICD-10-CM | POA: Insufficient documentation

## 2023-02-20 DIAGNOSIS — L299 Pruritus, unspecified: Secondary | ICD-10-CM | POA: Diagnosis not present

## 2023-02-20 DIAGNOSIS — R21 Rash and other nonspecific skin eruption: Secondary | ICD-10-CM | POA: Diagnosis not present

## 2023-02-20 MED ORDER — LORATADINE 10 MG PO TABS: 10.0000 mg | ORAL_TABLET | Freq: Every day | ORAL | 0 refills | Status: AC | PRN

## 2023-02-20 MED ORDER — ACETAMINOPHEN 325 MG PO TABS
975.0000 mg | ORAL_TABLET | Freq: Once | ORAL | Status: AC
Start: 2023-02-20 — End: 2023-02-20
  Administered 2023-02-20: 975 mg via ORAL
  Filled 2023-02-20: qty 3

## 2023-02-20 MED ORDER — EPINEPHRINE 0.3 MG/0.3ML IJ SOAJ
0.3000 mg | INTRAMUSCULAR | 0 refills | Status: AC | PRN
Start: 2023-02-20 — End: ?

## 2023-02-20 MED ORDER — PREDNISONE 50 MG PO TABS
50.0000 mg | ORAL_TABLET | Freq: Every day | ORAL | 0 refills | Status: AC
Start: 2023-02-20 — End: 2023-02-23

## 2023-02-20 MED ORDER — PREDNISONE 50 MG PO TABS
60.0000 mg | ORAL_TABLET | Freq: Once | ORAL | Status: AC
  Administered 2023-02-20: 60 mg via ORAL
  Filled 2023-02-20: qty 1

## 2023-02-20 MED ORDER — LORATADINE 10 MG PO TABS
10.0000 mg | ORAL_TABLET | Freq: Once | ORAL | Status: AC
  Administered 2023-02-20: 10 mg via ORAL
  Filled 2023-02-20: qty 1

## 2023-02-20 NOTE — Discharge Instructions (Signed)
 You were seen again in the emergency department for hives The most likely cause of your symptoms is a viral infection Take Claritin  as directed for itching and rash You should take prednisone  as directed for the next 3 days starting tomorrow morning to help with the inflammation We have also called in an EpiPen  in the event you have severe allergic reaction symptoms Remember that you need to come to the emergency department or call 911 if you use the EpiPen  Return to the Emergency Department for trouble breathing, worsening discomfort or any other concerns Otherwise follow-up with your primary care doctor within the next week

## 2023-02-20 NOTE — ED Provider Notes (Signed)
 [place] EMERGENCY DEPARTMENT AT [facility] Provider Note   CSN: [id number] Arrival date & time: 02/20/23  0401     History  Chief Complaint  Patient presents with   Urticaria    Dawn Boyd is a 53 y.o. female.  Who return to the emergency department for rash.  Patient was seen here yesterday for pruritic urticarial rash that began yesterday morning.  Her symptoms temporarily improved after Benadryl  and Decadron  but progressed today.  Rash is now more prominent over abdomen and back that she does have some epigastric discomfort as well as recent loose stools.  She does note cough and congestion a few days ago as well.  No chest pain shortness of breath nausea or vomiting.  No history of allergic reactions, new medications or new foods.  She was using a new lotion but has not uses in the last few days   Urticaria       Home Medications Prior to Admission medications   Medication Sig Start Date End Date Taking? Authorizing Provider  EPINEPHrine  0.3 mg/0.3 mL IJ SOAJ injection Inject 0.3 mg into the muscle as needed for anaphylaxis. 02/20/23  Yes Pamella Ozell LABOR, DO  loratadine  (CLARITIN ) 10 MG tablet Take 1 tablet (10 mg total) by mouth daily as needed for allergies or itching. 02/20/23  Yes Pamella Ozell LABOR, DO  predniSONE  (DELTASONE ) 50 MG tablet Take 1 tablet (50 mg total) by mouth daily with breakfast for 3 days. 02/20/23 02/23/23 Yes Pamella Ozell LABOR, DO  Multiple Vitamin (MULTIVITAMIN) tablet Take 1 tablet by mouth daily.    [provider]  naproxen  sodium (ALEVE ) 220 MG tablet Take 220 mg by mouth. Patient not taking: Reported on 04/05/2022    [provider]  naratriptan  (AMERGE) 2.5 MG tablet TAKE 1/2 TO 1 TABLET BY MOUTH IF NEEDED FOR MIGRAINE, DO NOT TAKE MORE THAN 3 TABLETS A WEEK 04/05/22   Georjean Darice HERO, MD      Allergies    Lactose intolerance (gi) and Tape    Review of Systems   Review of Systems  Physical Exam Updated Vital Signs BP  (!) 169/110   Pulse 70   Temp 98 F (36.7 C) (Oral)   Resp 18   Ht 5' 3 (1.6 m)   Wt 54.4 kg   LMP 10/31/2010   SpO2 100%   BMI 21.26 kg/m  Physical Exam Vitals and nursing note reviewed.  HENT:     Head: Normocephalic and atraumatic.  Eyes:     Pupils: Pupils are equal, round, and reactive to light.  Cardiovascular:     Rate and Rhythm: Normal rate and regular rhythm.  Pulmonary:     Effort: Pulmonary effort is normal.     Breath sounds: Normal breath sounds.  Abdominal:     Palpations: Abdomen is soft.     Tenderness: There is no abdominal tenderness.  Skin:    General: Skin is warm and dry.     Comments: Raised urticarial rash in patches over abdomen back and arms with excoriations  Neurological:     Mental Status: She is alert.  Psychiatric:        Mood and Affect: Mood normal.     ED Results / Procedures / Treatments   Labs (all labs ordered are listed, but only abnormal results are displayed) Labs Reviewed - No data to display  EKG None  Radiology DG Chest Portable 1 View Result Date: 02/19/2023 CLINICAL DATA:  Chest pain EXAM: PORTABLE  CHEST 1 VIEW COMPARISON:  None Available. FINDINGS: Lungs are hyperexpanded. The lungs are clear without focal pneumonia, edema, pneumothorax or pleural effusion. The cardiopericardial silhouette is within normal limits for size. No acute bony abnormality. IMPRESSION: No active disease. Electronically Signed   By: Camellia Candle M.D.   On: 02/19/2023 11:15    Procedures Procedures    Medications Ordered in ED Medications  predniSONE  (DELTASONE ) tablet 60 mg (has no administration in time range)  acetaminophen  (TYLENOL ) tablet 975 mg (has no administration in time range)  loratadine  (CLARITIN ) tablet 10 mg (10 mg Oral Given 02/20/23 0807)    ED Course/ Medical Decision Making/ A&P                                 Medical Decision Making 53 year old female returning for urticarial rash.  Was seen here yesterday symptoms  resolved with Decadron  and Benadryl  but returned today.  Rash is progressing.  Some abdominal discomfort but no nausea or vomiting.  No respiratory symptoms.  She did have some preceding viral symptoms so based on the presentation and lack of new triggers and other allergic symptoms most likely cause at this time would be postviral urticaria.  Will give her couple more days of steroids and treat with loratadine  here.  This is unlikely to be a true allergic reaction but we will call in an EpiPen  for her to have at home.  Return precautions will be worrisome for severe allergic reaction/anaphylaxis were discussed with the patient in detail and she articulated her understanding and knows what to come back for  Risk OTC drugs.           Final Clinical Impression(s) / ED Diagnoses Final diagnoses:  Urticaria    Rx / DC Orders ED Discharge Orders          Ordered    EPINEPHrine  0.3 mg/0.3 mL IJ SOAJ injection  As needed        02/20/23 0823    loratadine  (CLARITIN ) 10 MG tablet  Daily PRN        02/20/23 0823    predniSONE  (DELTASONE ) 50 MG tablet  Daily with breakfast        02/20/23 0823              Pamella Ozell LABOR, DO 02/20/23 [phone]

## 2023-02-20 NOTE — ED Triage Notes (Signed)
Pt POV d/t SOB prior to arriving but that has subsided but doe shave hives on her back.  She states she took benadryl and feels better with SOB.  Pt was seen earlier for same.  She was told to return if SOB.

## 2023-02-21 DIAGNOSIS — R17 Unspecified jaundice: Secondary | ICD-10-CM | POA: Diagnosis not present

## 2023-02-21 DIAGNOSIS — J029 Acute pharyngitis, unspecified: Secondary | ICD-10-CM | POA: Diagnosis not present

## 2023-02-21 DIAGNOSIS — R300 Dysuria: Secondary | ICD-10-CM | POA: Diagnosis not present

## 2023-02-21 DIAGNOSIS — I728 Aneurysm of other specified arteries: Secondary | ICD-10-CM | POA: Diagnosis not present

## 2023-02-21 DIAGNOSIS — D72825 Bandemia: Secondary | ICD-10-CM | POA: Diagnosis not present

## 2023-03-15 DIAGNOSIS — Z1211 Encounter for screening for malignant neoplasm of colon: Secondary | ICD-10-CM | POA: Diagnosis not present

## 2023-03-20 LAB — COLOGUARD: COLOGUARD: NEGATIVE

## 2023-03-20 LAB — EXTERNAL GENERIC LAB PROCEDURE: COLOGUARD: NEGATIVE

## 2023-04-06 ENCOUNTER — Ambulatory Visit (INDEPENDENT_AMBULATORY_CARE_PROVIDER_SITE_OTHER): Payer: BC Managed Care – PPO | Admitting: Neurology

## 2023-04-06 ENCOUNTER — Encounter: Payer: Self-pay | Admitting: Neurology

## 2023-04-06 DIAGNOSIS — G43009 Migraine without aura, not intractable, without status migrainosus: Secondary | ICD-10-CM | POA: Diagnosis not present

## 2023-04-06 MED ORDER — NARATRIPTAN HCL 2.5 MG PO TABS: ORAL_TABLET | ORAL | 11 refills | Status: AC

## 2023-04-06 NOTE — Patient Instructions (Signed)
 Good to see you.  Continue as needed naratriptan.   2. Try the Ubrelvy and let me know if it helps and we will send refills  3. Let me know if you would like start a migraine preventative medication  4. Follow-up in 1 year, call for any changes

## 2023-04-06 NOTE — Progress Notes (Signed)
 NEUROLOGY FOLLOW UP OFFICE NOTE  Clovia Reine [id number] 31-May-1970  HISTORY OF PRESENT ILLNESS: I had the pleasure of seeing Milan Clare in follow-up in the neurology clinic on 04/06/2023.  The patient was last seen a year ago for migraines. MRI brain normal. Historically she has been very sensitive to medications and had side effects on an antidepressant started for migraine prevention in the past, making her hesitant to start preventative medication. Since her last visit, she continues to report 2-3 migraines a week, she takes prn naratriptan with good effect. There are times, especially with big weather pressure changes or when she has sinus issues, where nothing helps and she would take another agent after naratriptan. Last month she took Aleve then a week later had a rash. Her dermatologist raised concern about an allergy to Aleve, or it may have been viral. She will be seeing an allergist soon. Since then she takes as needed Tylenol if naratriptan does not help. She tried Nurtec samples previously with not much benefit. She denies any dizziness, focal numbness/tingling/weakness. Vision is a little blurry. She gets 7 hours of sleep on average.    History on Initial Assessment 02/28/2016: This is a very pleasant 53 yo RH woman with a history of migraines since 2003, who presented for a second opinion on new approaches to her migraines. Migraines started after she gave birth to her son in 2003. They did get better after she had a hysterectomy 5 years ago, occurring every 2 weeks or so, however over the past 6 months, she has been having them more frequently, around 2-3 times a week. Migraines are over the frontal and vertex regions, sometimes behind her ears, she has throbbing pain with associated nausea if the headaches become really bad. She has really bad 10/10 ones where medications do not help much around once a month, lasting 2-3 days. She would take prn medication but headaches return. The  migraines 2-3 times a week are around 6/10, usually relieved by 1/2 tablet of naratriptan combined with Excedrin migraine No visual obscurations, she has some photo and phonophobia. She has found playing tennis to be a trigger, she plays it twice a week and consistently gets a migraine, so she would preventatively take naratriptan and Excedrin before playing. Other triggers include perfume, chocolates, wine, cheese, poor sleep, skipping meals. She has found that drinking coffee helps, she usually drinks 1 cup of coffee daily. She reports hitting her head twice this year, with the last one she had worsened headaches which have resolved. She has had right-sided tinnitus for the past year, ENT evaluation unremarkable. She had an MRI brain without contrast last 08/2015 which I personally reviewed, no acute changes seen, within normal limits. She previously took Maxalt which helped the migraine immediately, but did not last long enough, she would have migraine recurrence. She was switched to naratriptan, which does last longer, but takes a longer time to take effect. In the past she took an unrecalled antidepressant for migraine prevention, but had side effects of palpitations, which made her hesitant about preventative medications.   She denies any dizziness, diplopia, dysarthria/dysphagia, focal numbness/tingling/weakness, bowel/bladder dysfunction. She has some neck pain, at times with shooting pain in the back of her head, and feels that her migraines may be coming from her neck. She usually gets 7-8 hours of good sleep. There is a family history of migraines in her mother and sister. Her maternal grandmother had several strokes. She has a strong family history of cancer  and was positive for BRCA 2 gene mutation, and underwent bilateral mastectomy.   PAST MEDICAL HISTORY: Past Medical History:  Diagnosis Date   Allergy    Anxiety    situational   Arthritis    "clicking in the neck" on occasion    Blood  dyscrasia    thrombocytopenia-age 53, resolved, told that she is completely cured     Blood transfusion    pt states age 53 in [place] had problems with blood clotting, no further problems   Blood transfusion without reported diagnosis    BRCA2 positive 09/08/2011   pt. has hx./ mother has hx. of ovarian cancer-no cancers for patient   Bronchitis    Clotting disorder (HCC)    THROMOCYTOPENIA  AGE 53   Depression    AFTER CHILD BIRTH   Gallbladder polyp    GERD (gastroesophageal reflux disease)    tums prn   Gilbert's syndrome    "Liver doesn't process bilirubin"   Headache(784.0)    migraines-usually once weekly, really  bad q month or two   Heart murmur    no problems, states she has been told that it is not audible at all times    IBS (irritable bowel syndrome)    Osteopenia    MILD   PONV (postoperative nausea and vomiting)    Seasonal allergies    Thrombosis of splenic artery anastomosis (HCC)    Voiding difficulty    history of post procedure difficulty voiding    MEDICATIONS: Current Outpatient Medications on File Prior to Visit  Medication Sig Dispense Refill   EPINEPHrine 0.3 mg/0.3 mL IJ SOAJ injection Inject 0.3 mg into the muscle as needed for anaphylaxis. 1 each 0   loratadine (CLARITIN) 10 MG tablet Take 1 tablet (10 mg total) by mouth daily as needed for allergies or itching. 30 tablet 0   Multiple Vitamin (MULTIVITAMIN) tablet Take 1 tablet by mouth daily.     naproxen sodium (ALEVE) 220 MG tablet Take 220 mg by mouth. (Patient not taking: Reported on 04/05/2022)     naratriptan (AMERGE) 2.5 MG tablet TAKE 1/2 TO 1 TABLET BY MOUTH IF NEEDED FOR MIGRAINE, DO NOT TAKE MORE THAN 3 TABLETS A WEEK 10 tablet 11   Current Facility-Administered Medications on File Prior to Visit  Medication Dose Route Frequency Provider Last Rate Last Admin   0.9 %  sodium chloride infusion  500 mL Intravenous Continuous Pyrtle, Carie Caddy, MD        ALLERGIES: Allergies  Allergen  Reactions   Lactose Intolerance (Gi) Diarrhea, Nausea Only and Other (See Comments)    Bloating    Tape Dermatitis    Skin redness from the glue.    FAMILY HISTORY: Family History  Problem Relation Age of Onset   Hypertension Mother    Cancer Mother    Colon cancer Neg Hx    Esophageal cancer Neg Hx    Pancreatic cancer Neg Hx    Rectal cancer Neg Hx     SOCIAL HISTORY: Social History   Socioeconomic History   Marital status: Married    Spouse name: Not on file   Number of children: 2   Years of education: Not on file   Highest education level: Not on file  Occupational History   Occupation: Artist  Tobacco Use   Smoking status: Former    Types: Cigars    Quit date: 10/23/1993    Years since quitting: 29.4   Smokeless tobacco: Never   Tobacco  comments:    During college smoked cigars  Vaping Use   Vaping status: Never Used  Substance and Sexual Activity   Alcohol use: Yes    Alcohol/week: 7.0 standard drinks of alcohol    Types: 7 Cans of beer per week    Comment: social beer/ mixed drink, using less now   Drug use: No   Sexual activity: Yes  Other Topics Concern   Not on file  Social History Narrative   Right handed    Drinks caffeine   3 story home   Social Drivers of Health   Financial Resource Strain: Not on file  Food Insecurity: Not on file  Transportation Needs: Not on file  Physical Activity: Not on file  Stress: Not on file  Social Connections: Not on file  Intimate Partner Violence: Not on file     PHYSICAL EXAM: Vitals:   04/06/23 1356  BP: 117/70  Pulse: 64  SpO2: 98%   General: No acute distress Head:  Normocephalic/atraumatic Skin/Extremities: No rash, no edema Neurological Exam: alert and awake. No aphasia or dysarthria. Fund of knowledge is appropriate. Attention and concentration are normal.   Cranial nerves: Pupils equal, round. Extraocular movements intact with no nystagmus. Visual fields full.  No facial asymmetry.  Motor:  Bulk and tone normal, muscle strength 5/5 throughout with no pronator drift.   Finger to nose testing intact.  Gait narrow-based and steady, able to tandem walk adequately.  Romberg negative.   IMPRESSION: This is a pleasant 53 yo RH woman with a history of migraines without aura. Migraines are pretty stable on prn naratriptan, however she continues to report 2-3 a week. We again discussed migraine preventative options, she is not interested in injections, Bennie Pierini may be an option if she would like to try a different preventative medication. She was given samples for Ubrelvy to try if naratriptan is not helpful. She knows to minimize rescue medications to 2-3 a week to avoid rebound headaches. Follow-up in 1 year, call for any changes.   Thank you for allowing me to participate in her care.  Please do not hesitate to call for any questions or concerns.    Patrcia Dolly, M.D.   CC: Dr. Timothy Lasso

## 2023-04-13 DIAGNOSIS — H1045 Other chronic allergic conjunctivitis: Secondary | ICD-10-CM | POA: Diagnosis not present

## 2023-04-13 DIAGNOSIS — T781XXA Other adverse food reactions, not elsewhere classified, initial encounter: Secondary | ICD-10-CM | POA: Diagnosis not present

## 2023-04-13 DIAGNOSIS — R21 Rash and other nonspecific skin eruption: Secondary | ICD-10-CM | POA: Diagnosis not present

## 2023-04-13 DIAGNOSIS — J309 Allergic rhinitis, unspecified: Secondary | ICD-10-CM | POA: Diagnosis not present

## 2023-07-24 DIAGNOSIS — D2272 Melanocytic nevi of left lower limb, including hip: Secondary | ICD-10-CM | POA: Diagnosis not present

## 2023-07-24 DIAGNOSIS — L738 Other specified follicular disorders: Secondary | ICD-10-CM | POA: Diagnosis not present

## 2023-07-24 DIAGNOSIS — D485 Neoplasm of uncertain behavior of skin: Secondary | ICD-10-CM | POA: Diagnosis not present

## 2023-07-24 DIAGNOSIS — L918 Other hypertrophic disorders of the skin: Secondary | ICD-10-CM | POA: Diagnosis not present

## 2023-09-03 DIAGNOSIS — E785 Hyperlipidemia, unspecified: Secondary | ICD-10-CM | POA: Diagnosis not present

## 2023-09-03 DIAGNOSIS — Z1389 Encounter for screening for other disorder: Secondary | ICD-10-CM | POA: Diagnosis not present

## 2023-09-03 DIAGNOSIS — Z1212 Encounter for screening for malignant neoplasm of rectum: Secondary | ICD-10-CM | POA: Diagnosis not present

## 2023-09-03 DIAGNOSIS — D649 Anemia, unspecified: Secondary | ICD-10-CM | POA: Diagnosis not present

## 2023-09-03 DIAGNOSIS — E559 Vitamin D deficiency, unspecified: Secondary | ICD-10-CM | POA: Diagnosis not present

## 2023-09-10 DIAGNOSIS — Z1331 Encounter for screening for depression: Secondary | ICD-10-CM | POA: Diagnosis not present

## 2023-09-10 DIAGNOSIS — R82998 Other abnormal findings in urine: Secondary | ICD-10-CM | POA: Diagnosis not present

## 2023-09-10 DIAGNOSIS — Z0000 Encounter for general adult medical examination without abnormal findings: Secondary | ICD-10-CM | POA: Diagnosis not present

## 2023-09-10 DIAGNOSIS — R17 Unspecified jaundice: Secondary | ICD-10-CM | POA: Diagnosis not present

## 2023-09-10 DIAGNOSIS — Z23 Encounter for immunization: Secondary | ICD-10-CM | POA: Diagnosis not present

## 2023-09-20 ENCOUNTER — Other Ambulatory Visit: Payer: Self-pay | Admitting: General Surgery

## 2023-09-20 DIAGNOSIS — D134 Benign neoplasm of liver: Secondary | ICD-10-CM

## 2023-09-24 ENCOUNTER — Ambulatory Visit
Admission: RE | Admit: 2023-09-24 | Discharge: 2023-09-24 | Disposition: A | Discharge status: Home / Self Care | Source: Ambulatory Visit | Attending: General Surgery | Admitting: General Surgery

## 2023-09-24 DIAGNOSIS — K7689 Other specified diseases of liver: Secondary | ICD-10-CM | POA: Diagnosis not present

## 2023-09-24 DIAGNOSIS — D134 Benign neoplasm of liver: Secondary | ICD-10-CM

## 2023-09-24 MED ORDER — GADOPICLENOL 0.5 MMOL/ML IV SOLN
6.0000 mL | Freq: Once | INTRAVENOUS | Status: AC | PRN
Start: 2023-09-24 — End: 2023-09-24
  Administered 2023-09-24: 6 mL via INTRAVENOUS

## 2023-10-05 DIAGNOSIS — Z1509 Genetic susceptibility to other malignant neoplasm: Secondary | ICD-10-CM | POA: Diagnosis not present

## 2023-10-05 DIAGNOSIS — Z1501 Genetic susceptibility to malignant neoplasm of breast: Secondary | ICD-10-CM | POA: Diagnosis not present

## 2023-10-05 DIAGNOSIS — Z1502 Genetic susceptibility to malignant neoplasm of ovary: Secondary | ICD-10-CM | POA: Diagnosis not present

## 2023-10-05 DIAGNOSIS — K7689 Other specified diseases of liver: Secondary | ICD-10-CM | POA: Diagnosis not present

## 2023-10-20 DIAGNOSIS — Z23 Encounter for immunization: Secondary | ICD-10-CM | POA: Diagnosis not present

## 2023-11-29 ENCOUNTER — Encounter: Payer: Self-pay | Admitting: Neurology

## 2023-12-06 DIAGNOSIS — M810 Age-related osteoporosis without current pathological fracture: Secondary | ICD-10-CM | POA: Diagnosis not present

## 2024-04-07 ENCOUNTER — Ambulatory Visit: Admitting: Neurology
